# Patient Record
Sex: Male | Born: 1955
Health system: Southern US, Community
[De-identification: ages and names within clinical notes are randomized; demographics above are authoritative.]

## PROBLEM LIST (undated history)

## (undated) DIAGNOSIS — G4730 Sleep apnea, unspecified: Secondary | ICD-10-CM

## (undated) DIAGNOSIS — J449 Chronic obstructive pulmonary disease, unspecified: Secondary | ICD-10-CM

## (undated) DIAGNOSIS — I2510 Atherosclerotic heart disease of native coronary artery without angina pectoris: Secondary | ICD-10-CM

## (undated) DIAGNOSIS — I428 Other cardiomyopathies: Secondary | ICD-10-CM

## (undated) DIAGNOSIS — I509 Heart failure, unspecified: Secondary | ICD-10-CM

## (undated) DIAGNOSIS — F419 Anxiety disorder, unspecified: Secondary | ICD-10-CM

## (undated) DIAGNOSIS — E119 Type 2 diabetes mellitus without complications: Secondary | ICD-10-CM

## (undated) DIAGNOSIS — I5022 Chronic systolic (congestive) heart failure: Secondary | ICD-10-CM

## (undated) DIAGNOSIS — H269 Unspecified cataract: Secondary | ICD-10-CM

## (undated) HISTORY — DX: Unspecified cataract: H26.9

## (undated) HISTORY — DX: Heart failure, unspecified: I50.9

## (undated) HISTORY — DX: Sleep apnea, unspecified: G47.30

## (undated) HISTORY — DX: Other cardiomyopathies: I42.8

## (undated) HISTORY — DX: Anxiety disorder, unspecified: F41.9

## (undated) HISTORY — DX: Chronic obstructive pulmonary disease, unspecified: J44.9

## (undated) HISTORY — DX: Atherosclerotic heart disease of native coronary artery without angina pectoris: I25.10

## (undated) HISTORY — DX: Chronic systolic (congestive) heart failure: I50.22

## (undated) HISTORY — DX: Type 2 diabetes mellitus without complications: E11.9

---

## 2018-02-04 ENCOUNTER — Other Ambulatory Visit: Payer: Self-pay | Admitting: Family Medicine

## 2018-02-04 DIAGNOSIS — J849 Interstitial pulmonary disease, unspecified: Secondary | ICD-10-CM | POA: Diagnosis not present

## 2018-02-04 DIAGNOSIS — R0602 Shortness of breath: Secondary | ICD-10-CM

## 2018-02-06 ENCOUNTER — Ambulatory Visit
Admission: RE | Admit: 2018-02-06 | Discharge: 2018-02-06 | Disposition: A | Discharge status: Home / Self Care | Payer: BLUE CROSS/BLUE SHIELD | Source: Ambulatory Visit | Attending: Family Medicine | Admitting: Family Medicine

## 2018-02-06 ENCOUNTER — Encounter (INDEPENDENT_AMBULATORY_CARE_PROVIDER_SITE_OTHER): Payer: Self-pay

## 2018-02-06 DIAGNOSIS — R918 Other nonspecific abnormal finding of lung field: Secondary | ICD-10-CM | POA: Insufficient documentation

## 2018-02-06 DIAGNOSIS — I2510 Atherosclerotic heart disease of native coronary artery without angina pectoris: Secondary | ICD-10-CM | POA: Insufficient documentation

## 2018-02-06 DIAGNOSIS — I700 Atherosclerosis of aorta: Secondary | ICD-10-CM | POA: Insufficient documentation

## 2018-02-06 DIAGNOSIS — R0602 Shortness of breath: Secondary | ICD-10-CM

## 2018-03-02 DIAGNOSIS — J181 Lobar pneumonia, unspecified organism: Secondary | ICD-10-CM | POA: Diagnosis not present

## 2018-03-02 DIAGNOSIS — R5383 Other fatigue: Secondary | ICD-10-CM | POA: Diagnosis not present

## 2018-03-12 ENCOUNTER — Ambulatory Visit: Payer: BLUE CROSS/BLUE SHIELD | Admitting: Internal Medicine

## 2018-03-12 ENCOUNTER — Encounter: Payer: Self-pay | Admitting: Internal Medicine

## 2018-03-12 VITALS — BP 148/92 | HR 107 | Resp 16 | Ht 67.0 in | Wt 213.0 lb

## 2018-03-12 DIAGNOSIS — J929 Pleural plaque without asbestos: Secondary | ICD-10-CM

## 2018-03-12 DIAGNOSIS — R918 Other nonspecific abnormal finding of lung field: Secondary | ICD-10-CM | POA: Insufficient documentation

## 2018-03-12 DIAGNOSIS — R0602 Shortness of breath: Secondary | ICD-10-CM | POA: Insufficient documentation

## 2018-03-12 DIAGNOSIS — J90 Pleural effusion, not elsewhere classified: Secondary | ICD-10-CM | POA: Diagnosis not present

## 2018-03-12 DIAGNOSIS — J869 Pyothorax without fistula: Secondary | ICD-10-CM | POA: Insufficient documentation

## 2018-03-12 NOTE — Patient Instructions (Signed)
Pleural Effusion °A pleural effusion is an abnormal buildup of fluid in the layers of tissue between your lungs and the inside of your chest (pleural space). These two layers of tissue that line both your lungs and the inside of your chest are called pleura. Usually, there is no air in the space between the pleura, only a thin layer of fluid. If left untreated, a large amount of fluid can build up and cause the lung to collapse. A pleural effusion is usually caused by another disease that requires treatment. °The two main types of pleural effusion are: °· Transudative pleural effusion. This happens when fluid leaks into the pleural space because of a low protein count in your blood or high blood pressure in your vessels. Heart failure often causes this. °· Exudative infusion. This occurs when fluid collects in the pleural space from blocked blood vessels or lymph vessels. Some lung diseases, injuries, and cancers can cause this type of effusion. ° °What are the causes? °Pleural effusion can be caused by: °· Heart failure. °· A blood clot in the lung (pulmonary embolism). °· Pneumonia. °· Cancer. °· Liver failure (cirrhosis). °· Kidney disease. °· Complications from surgery, such as from open heart surgery. ° °What are the signs or symptoms? °In some cases, pleural effusion may cause no symptoms. Symptoms can include: °· Shortness of breath, especially when lying down. °· Chest pain, often worse when taking a deep breath. °· Fever. °· Dry cough that is lasting (chronic). °· Hiccups. °· Rapid breathing. ° °An underlying condition that is causing the pleural effusion (such as heart failure, pneumonia, blood clots, tuberculosis, or cancer) may also cause additional symptoms. °How is this diagnosed? °Your health care provider may suspect pleural effusion based on your symptoms and medical history. Your health care provider will also do a physical exam and a chest X-ray. If the X-ray shows there is fluid in your chest,  you may need to have this fluid removed using a needle (thoracentesis) so it can be tested. °You may also have: °· Imaging studies of the chest, such as: °? Ultrasound. °? CT scan. °· Blood tests for kidney and liver function. ° °How is this treated? °Treatment depends on the cause of the pleural effusion. Treatment may include: °· Taking antibiotic medicines to clear up an infection that is causing the pleural effusion. °· Placing a tube in the chest to drain the effusion (tube thoracostomy). This procedure is often used when there is an infection in the fluid. °· Surgery to remove the fibrous outer layer of tissue from the pleural space (decortication). °· Thoracentesis, which can improve cough and shortness of breath. °· A procedure to put medicine into the chest cavity to seal the pleural space to prevent fluid buildup (pleurodesis). °· Chemotherapy and radiation therapy. These may be required in the case of cancerous (malignant) pleural effusion. ° °Follow these instructions at home: °· Take medicines only as directed by your health care provider. °· Keep track of how long you can gently exercise before you get short of breath. Try simply walking at first. °· Do not use any tobacco products, including cigarettes, chewing tobacco, or electronic cigarettes. If you need help quitting, ask your health care provider. °· Keep all follow-up visits as directed by your health care provider. This is important. °Contact a health care provider if: °· The amount of time that you are able to exercise decreases or does not improve with time. °· You have pain or signs of infection   at the puncture site if you had thoracentesis. Watch for: °? Drainage. °? Redness. °? Swelling. °· You have a fever. °Get help right away if: °· You are short of breath. °· You develop chest pain. °· You develop a new cough. °This information is not intended to replace advice given to you by your health care provider. Make sure you discuss any  questions you have with your health care provider. °Document Released: 11/06/2005 Document Revised: 04/10/2016 Document Reviewed: 04/01/2014 °Elsevier Interactive Patient Education © 2018 [employer] ° °

## 2018-03-12 NOTE — Progress Notes (Signed)
[facility] [address] [place], [place] [address]  Pulmonary Sleep Medicine   Office Visit Note  Patient Name: Joe Berry DOB: [birth year] MRN [id number]  Date of Service: 03/12/2018  Complaints/HPI:  Patient is referred for evaluation of shortness of breath and abnormal CT.  Patient had been having some cough and shortness of breath and congestion he was seen by urgent care had a chest x-ray done which was abnormal.  Was referred for CT scan of the chest which I have available.  The CT scan shows fluid which appears to be loculated concerning for an empyema.  Patient states that he has received several rounds of antibiotics he now feels improved but not quite back to baseline.  In addition incidental finding of pleural plaque was noted with calcifications in the pleural area.  He states he has never had any exposure to asbestos.  I asked him if his father worked with asbestos any stated that that was never the case.  He himself worked in dusty environments but not really have any direct exposure to asbestos.  Of note he has been a pack and a half smoker every day and just recently has quit smoking very currently denies having any chest pain no palpitations 6 shortness of breath he says has improved somewhat.  ROS  General: (-) fever, (-) chills, (-) night sweats, (-) weakness Skin: (-) rashes, (-) itching,. Eyes: (-) visual changes, (-) redness, (-) itching. Nose and Sinuses: (-) nasal stuffiness or itchiness, (-) postnasal drip, (-) nosebleeds, (-) sinus trouble. Mouth and Throat: (-) sore throat, (-) hoarseness. Neck: (-) swollen glands, (-) enlarged thyroid, (-) neck pain. Respiratory: - cough, (-) bloody sputum, - shortness of breath, - wheezing. Cardiovascular: - ankle swelling, (-) chest pain. +gallop Lymphatic: (-) lymph node enlargement. Neurologic: (-) numbness, (-) tingling. Psychiatric: (-) anxiety, (-) depression   Current Medication: Outpatient Encounter  Medications as of 03/12/2018  Medication Sig  . [place] (FIBER CHOICE PO) Take by mouth.   No facility-administered encounter medications on file as of 03/12/2018.     Surgical History:   Medical History: Past Medical History:  Diagnosis Date  . Anxiety     Family History: Family History  Problem Relation Age of Onset  . Heart disease Mother   . Lung cancer Father   . Heart disease Maternal Grandmother     Social History: Social History   Socioeconomic History  . Marital status: Single    Spouse name: Not on file  . Number of children: Not on file  . Years of education: Not on file  . Highest education level: Not on file  Occupational History  . Not on file  Social Needs  . Financial resource strain: Not on file  . Food insecurity:    Worry: Not on file    Inability: Not on file  . Transportation needs:    Medical: Not on file    Non-medical: Not on file  Tobacco Use  . Smoking status: Former Smoker    Types: Cigarettes  . Smokeless tobacco: Never Used  . Tobacco comment: quit 2008  Substance and Sexual Activity  . Alcohol use: Never    Frequency: Never  . Drug use: Never  . Sexual activity: Not on file  Lifestyle  . Physical activity:    Days per week: Not on file    Minutes per session: Not on file  . Stress: Not on file  Relationships  . Social connections:    Talks  on phone: Not on file    Gets together: Not on file    Attends religious service: Not on file    Active member of club or organization: Not on file    Attends meetings of clubs or organizations: Not on file    Relationship status: Not on file  . Intimate partner violence:    Fear of current or ex partner: Not on file    Emotionally abused: Not on file    Physically abused: Not on file    Forced sexual activity: Not on file  Other Topics Concern  . Not on file  Social History Narrative  . Not on file    Vital Signs: Blood pressure (!) 148/92, pulse (!) 107, resp. rate 16, height  5\' 7"  (1.702 m), weight 213 lb (96.6 kg), SpO2 95 %.  Examination: General Appearance: The patient is well-developed, well-nourished, and in no distress. Skin: Gross inspection of skin unremarkable. Head: normocephalic, no gross deformities. Eyes: no gross deformities noted. ENT: ears appear grossly normal no exudates. Neck: Supple. No thyromegaly. No LAD. Respiratory:  Distant breath sounds scattered rhonchi. Cardiovascular: Normal S1 and S2 without murmur or rub. Extremities: No cyanosis. pulses are equal. Neurologic: Alert and oriented. No involuntary movements.  LABS: No results found for this or any previous visit (from the past 2160 hour(s)).  Radiology: Ct Chest Wo Contrast  Result Date: 02/06/2018 CLINICAL DATA:  Coughing congestion for 2 weeks, shortness of breath, abnormal outside chest x-ray, not currently available EXAM: CT CHEST WITHOUT CONTRAST TECHNIQUE: Multidetector CT imaging of the chest was performed following the standard protocol without IV contrast. COMPARISON:  None. FINDINGS: Cardiovascular: Moderate thoracic aortic atherosclerosis is present. Diffuse coronary artery calcifications are noted involving left anterior descending, left main, circumflex, and right coronary arteries. Cardiomegaly is noted with only a tiny amount of pericardial fluid noted. Mediastinum/Nodes: No mediastinal or hilar adenopathy is seen. All mediastinal lymph nodes are within normal limits in size. The thyroid gland is unremarkable. No abnormality of the esophagus is seen. Lungs/Pleura: On the right there is some pleural thickening throughout the right hemithorax. There is a loculated collection of fluid posteriorly within the major and minor fissures. This collection has attenuation of 8 Hounsfield units. An empyema cannot be excluded. There is triangular opacity within the posteromedial right lower lobe with air bronchograms most consistent with a focus of pneumonia versus atelectasis. There are  several scattered pleural calcifications on the right and there is considerable calcification of the right hemidiaphragm. These findings most likely are asbestos related. However, no suspicious mass is seen. The central airway is patent. Upper Abdomen: Within the upper abdomen, the left lobe of the liver is somewhat prominent. The pancreas is unremarkable. Musculoskeletal: There are degenerative changes within the mid to lower thoracic spine. IMPRESSION: 1. Loculated fluid posteriorly on the right at the junction of the major and minor fissures. Empyema is difficult to exclude. 2. Triangular opacity in the posteromedial right lower lobe with air bronchograms most consistent with a focus of pneumonia. 3. Pleural calcifications and calcifications of the right hemidiaphragm with somewhat thickened pleura throughout the right hemithorax, findings most likely asbestos related. 4. Moderate thoracic aortic atherosclerosis and diffuse coronary artery calcifications. Electronically Signed   By: Dwyane Dee M.D.   On: 02/06/2018 14:29    No results found.  No results found.    Assessment and Plan: Patient Active Problem List   Diagnosis Date Noted  . Empyema lung (HCC) 03/12/2018  . Pleural  effusion 03/12/2018  . Pleural plaque without asbestos 03/12/2018  . SOB (shortness of breath) 03/12/2018  . Lung mass 03/12/2018    1. SOB  Somewhat improving will get a follow-up CT scan to see if there has been resolution of the fluid collection.  If not patient will need either a CT guided biopsy or drainage of the collection of fluid for further analysis.  My concern in this situation obviously is for underlying malignancy in heavy smoker 2. Empyema? Possibility of empyema also exists would recommend doing the CT scan to do a follow-up and then suggest doing either an ultrasound-guided thoracentesis for CT scan-guided aspiration 3. Pleural Plaques without a direct history of exposure to asbestos unclear etiology  we will do followup CT as already mentioned above 4. ?lung Mass as noted above we will drain the fluid if necessary 2 look for underlying mass or tumor in a heavy smoker 5. Abnormal Heart sounds echocardiogram was ordered Will get that scheduled for abnormal heart sounds and a gallop on  Evaluation along with a history of shortness of breath  General Counseling: I have discussed the findings of the evaluation and examination with Harvie Heck.  I have also discussed any further diagnostic evaluation thatmay be needed or ordered today. Keisean verbalizes understanding of the findings of todays visit. We also reviewed his medications today and discussed drug interactions and side effects including but not limited excessive drowsiness and altered mental states. We also discussed that there is always a risk not just to him but also people around him. he has been encouraged to call the office with any questions or concerns that should arise related to todays visit.    Time spent:  I have personally obtained a history, examined the patient, evaluated laboratory and imaging results, formulated the assessment and plan and placed orders.    Yevonne Pax, MD [facility] Sleep medicine

## 2018-03-13 ENCOUNTER — Telehealth: Payer: Self-pay

## 2018-03-13 NOTE — Telephone Encounter (Signed)
Patient advised that he is scheduled for CT Chest on 03/19/18 @ 9:00 mebane/tat

## 2018-03-14 NOTE — Addendum Note (Signed)
Addended by: Lyndon Code on: 03/14/2018 11:34 AM   Modules accepted: Orders

## 2018-03-18 ENCOUNTER — Telehealth: Payer: Self-pay | Admitting: Internal Medicine

## 2018-03-18 NOTE — Telephone Encounter (Signed)
PATIENT WAS INFORMED OF APPT. AT 11:00 O 03/19/18 AT [email or web address]

## 2018-03-19 ENCOUNTER — Ambulatory Visit
Admission: RE | Admit: 2018-03-19 | Discharge: 2018-03-19 | Disposition: A | Discharge status: Home / Self Care | Payer: BLUE CROSS/BLUE SHIELD | Source: Ambulatory Visit | Attending: Internal Medicine | Admitting: Internal Medicine

## 2018-03-19 ENCOUNTER — Other Ambulatory Visit
Admission: RE | Admit: 2018-03-19 | Discharge: 2018-03-19 | Disposition: A | Discharge status: Home / Self Care | Payer: BLUE CROSS/BLUE SHIELD | Source: Ambulatory Visit | Attending: Internal Medicine | Admitting: Internal Medicine

## 2018-03-19 DIAGNOSIS — R911 Solitary pulmonary nodule: Secondary | ICD-10-CM | POA: Insufficient documentation

## 2018-03-19 DIAGNOSIS — J869 Pyothorax without fistula: Secondary | ICD-10-CM | POA: Diagnosis not present

## 2018-03-19 DIAGNOSIS — R918 Other nonspecific abnormal finding of lung field: Secondary | ICD-10-CM | POA: Insufficient documentation

## 2018-03-19 DIAGNOSIS — I2510 Atherosclerotic heart disease of native coronary artery without angina pectoris: Secondary | ICD-10-CM | POA: Insufficient documentation

## 2018-03-19 DIAGNOSIS — Z87891 Personal history of nicotine dependence: Secondary | ICD-10-CM | POA: Diagnosis not present

## 2018-03-19 DIAGNOSIS — J439 Emphysema, unspecified: Secondary | ICD-10-CM | POA: Insufficient documentation

## 2018-03-19 DIAGNOSIS — R0602 Shortness of breath: Secondary | ICD-10-CM | POA: Diagnosis not present

## 2018-03-19 LAB — CREATININE, SERUM
Creatinine, Ser: 0.76 mg/dL (ref 0.61–1.24)
GFR calc Af Amer: >60 mL/min (ref >60)
GFR calc non Af Amer: >60 mL/min (ref >60)

## 2018-03-19 MED ORDER — IOPAMIDOL (ISOVUE-300) INJECTION 61%
75.0000 mL | Freq: Once | INTRAVENOUS | Status: AC | PRN
  Administered 2018-03-19: 75 mL via INTRAVENOUS

## 2018-03-19 NOTE — Progress Notes (Signed)
*  PRELIMINARY RESULTS* Echocardiogram 2D Echocardiogram has been performed.  Joe Berry [name] 03/19/2018, 11:41 AM

## 2018-03-20 ENCOUNTER — Ambulatory Visit: Payer: BLUE CROSS/BLUE SHIELD | Admitting: Internal Medicine

## 2018-03-20 DIAGNOSIS — R0602 Shortness of breath: Secondary | ICD-10-CM | POA: Diagnosis not present

## 2018-03-20 LAB — PULMONARY FUNCTION TEST

## 2018-03-26 ENCOUNTER — Ambulatory Visit: Payer: BLUE CROSS/BLUE SHIELD | Admitting: Internal Medicine

## 2018-03-26 ENCOUNTER — Encounter: Payer: Self-pay | Admitting: Internal Medicine

## 2018-03-26 VITALS — BP 144/80 | HR 110 | Resp 16 | Ht 67.0 in | Wt 214.8 lb

## 2018-03-26 DIAGNOSIS — I5021 Acute systolic (congestive) heart failure: Secondary | ICD-10-CM

## 2018-03-26 DIAGNOSIS — J90 Pleural effusion, not elsewhere classified: Secondary | ICD-10-CM | POA: Diagnosis not present

## 2018-03-26 DIAGNOSIS — R0602 Shortness of breath: Secondary | ICD-10-CM

## 2018-03-26 DIAGNOSIS — J869 Pyothorax without fistula: Secondary | ICD-10-CM

## 2018-03-26 NOTE — Progress Notes (Signed)
[facility] [place], [place] [address]  Pulmonary Sleep Medicine   Office Visit Note  Patient Name: Joe Berry DOB: 07-30-1956 MRN [id number]  Date of Service: 03/26/2018  Complaints/HPI:  Patient is here for follow-up after echocardiogram.  The echocardiogram showed ejection fraction of approximately 30%.  He has no chest pain no previous history of heart failure no coronary artery disease by history patient has had no swelling of his legs the only symptoms have been shortness of breath.  He does have a history of sleep apnea and he is non compliant with CPAP therapy.  We had a very lengthy conversation regarding the options at this point and we will be referring him to a cardiologist  ROS  General: (-) fever, (-) chills, (-) night sweats, (-) weakness Skin: (-) rashes, (-) itching,. Eyes: (-) visual changes, (-) redness, (-) itching. Nose and Sinuses: (-) nasal stuffiness or itchiness, (-) postnasal drip, (-) nosebleeds, (-) sinus trouble. Mouth and Throat: (-) sore throat, (-) hoarseness. Neck: (-) swollen glands, (-) enlarged thyroid, (-) neck pain. Respiratory: - cough, (-) bloody sputum, + shortness of breath, - wheezing. Cardiovascular: - ankle swelling, (-) chest pain. Lymphatic: (-) lymph node enlargement. Neurologic: (-) numbness, (-) tingling. Psychiatric: (-) anxiety, (-) depression   Current Medication: Outpatient Encounter Medications as of 03/26/2018  Medication Sig  . Inulin (FIBER CHOICE PO) Take by mouth.   No facility-administered encounter medications on file as of 03/26/2018.     Surgical History: History reviewed. No pertinent surgical history.  Medical History: Past Medical History:  Diagnosis Date  . Anxiety     Family History: Family History  Problem Relation Age of Onset  . Heart disease Mother   . Lung cancer Father   . Heart disease Maternal Grandmother     Social History: Social History   Socioeconomic  History  . Marital status: Single    Spouse name: Not on file  . Number of children: Not on file  . Years of education: Not on file  . Highest education level: Not on file  Occupational History  . Not on file  Social Needs  . Financial resource strain: Not on file  . Food insecurity:    Worry: Not on file    Inability: Not on file  . Transportation needs:    Medical: Not on file    Non-medical: Not on file  Tobacco Use  . Smoking status: Former Smoker    Types: Cigarettes  . Smokeless tobacco: Never Used  . Tobacco comment: quit 2008  Substance and Sexual Activity  . Alcohol use: Never    Frequency: Never  . Drug use: Never  . Sexual activity: Not on file  Lifestyle  . Physical activity:    Days per week: Not on file    Minutes per session: Not on file  . Stress: Not on file  Relationships  . Social connections:    Talks on phone: Not on file    Gets together: Not on file    Attends religious service: Not on file    Active member of club or organization: Not on file    Attends meetings of clubs or organizations: Not on file    Relationship status: Not on file  . Intimate partner violence:    Fear of current or ex partner: Not on file    Emotionally abused: Not on file    Physically abused: Not on file    Forced sexual activity: Not  on file  Other Topics Concern  . Not on file  Social History Narrative  . Not on file    Vital Signs: Blood pressure (!) 144/80, pulse (!) 110, resp. rate 16, height '5\' 7"'  (1.702 m), weight 214 lb 12.8 oz (97.4 kg), SpO2 95 %.  Examination: General Appearance: The patient is well-developed, well-nourished, and in no distress. Skin: Gross inspection of skin unremarkable. Head: normocephalic, no gross deformities. Eyes: no gross deformities noted. ENT: ears appear grossly normal no exudates. Neck: Supple. No thyromegaly. No LAD. Respiratory: no rhonchi. Cardiovascular: Normal S1 and S2 without murmur or rub. Extremities: No  cyanosis. pulses are equal. Neurologic: Alert and oriented. No involuntary movements.  LABS: Recent Results (from the past 2160 hour(s))  Creatinine, serum     Status: None   Collection Time: 03/19/18  9:25 AM  Result Value Ref Range   Creatinine, Ser 0.76 0.61 - 1.24 mg/dL   GFR calc non Af Amer >60 >60 mL/min   GFR calc Af Amer >60 >60 mL/min    Comment: (NOTE) The eGFR has been calculated using the CKD EPI equation. This calculation has not been validated in all clinical situations. eGFR's persistently <60 mL/min signify possible Chronic Kidney Disease. Performed at [facility], [address], [place], [place] [address]     Radiology: Ct Chest W Contrast  Result Date: 03/19/2018 CLINICAL DATA:  62 year old male status post chest CT in March for cough congestion and shortness of breath. Former smoker. Subsequent encounter. EXAM: CT CHEST WITH CONTRAST TECHNIQUE: Multidetector CT imaging of the chest was performed during intravenous contrast administration. CONTRAST:  58m ISOVUE-300 IOPAMIDOL (ISOVUE-300) INJECTION 61% COMPARISON:  Chest CT without contrast 02/06/2018. FINDINGS: Cardiovascular: Calcified coronary artery atherosclerosis. Lesser Calcified aortic atherosclerosis. Stable mild cardiomegaly. No pericardial effusion. Mediastinum/Nodes: Stable with no mediastinal or hilar lymphadenopathy identified. Lungs/Pleura: The major airways remain patent. Upper lobe emphysema, more pronounced on the right with both centrilobular and paraseptal features. Bilateral perihilar bronchiectasis. Minor left apical and left lung base scarring is stable with no other confluent left lung opacity. Unchanged appearance of partially calcified pleural thickening throughout much of the right hemithorax and especially along the posterior and diaphragmatic surfaces of the pleura (series 2, image 125). There is associated enhancing atelectasis in the posterior basal segment of the right lower  lobe which is unchanged since March and again demonstrates some air bronchograms. A superimposed small volume of what appears to be trapped pleural fluid in the major fissure (series 2, image 92) has mildly decreased but not resolved. No pleural enhancement is associated. No new areas of confluent opacity or consolidation in the right lung. A 4 mm upper lobe lung nodule is stable on series 3, image 63. Upper Abdomen: Negative visible liver, spleen, pancreas, adrenal glands, kidneys, and bowel in the upper abdomen. Musculoskeletal: No acute osseous abnormality identified. IMPRESSION: 1. Unchanged CT appearance of the chest since March. The constellation is most compatible with chronic lung disease consisting of combined: Emphysema (ICD10-J43.9), mild bronchiectasis, and right side asbestos related fibrothorax. Associated chronic posterior basal segment right lower lobe atelectasis plus a small volume of trapped pleural fluid along the right major fissure. No empyema or acute pneumonia suspected. 2. Superimposed short interval stability of a 4 mm right upper lobe lung nodule. In this clinical setting re-evaluation with a follow-up chest CT would be recommended in March 2020. However, in light of the above and the history of smoking this patient might be appropriate for and benefit  most from annual Screening with Low Dose Computed Tomography (LDCT). See reference: SafeInstruments.co.uk.aspx?NCAId=[id number] 3. Calcified coronary artery atherosclerosis. Electronically Signed   By: Genevie Ann M.D.   On: 03/19/2018 14:56    No results found.  Ct Chest W Contrast  Result Date: 03/19/2018 CLINICAL DATA:  62 year old male status post chest CT in March for cough congestion and shortness of breath. Former smoker. Subsequent encounter. EXAM: CT CHEST WITH CONTRAST TECHNIQUE: Multidetector CT imaging of the chest was performed during intravenous contrast administration. CONTRAST:   49m ISOVUE-300 IOPAMIDOL (ISOVUE-300) INJECTION 61% COMPARISON:  Chest CT without contrast 02/06/2018. FINDINGS: Cardiovascular: Calcified coronary artery atherosclerosis. Lesser Calcified aortic atherosclerosis. Stable mild cardiomegaly. No pericardial effusion. Mediastinum/Nodes: Stable with no mediastinal or hilar lymphadenopathy identified. Lungs/Pleura: The major airways remain patent. Upper lobe emphysema, more pronounced on the right with both centrilobular and paraseptal features. Bilateral perihilar bronchiectasis. Minor left apical and left lung base scarring is stable with no other confluent left lung opacity. Unchanged appearance of partially calcified pleural thickening throughout much of the right hemithorax and especially along the posterior and diaphragmatic surfaces of the pleura (series 2, image 125). There is associated enhancing atelectasis in the posterior basal segment of the right lower lobe which is unchanged since March and again demonstrates some air bronchograms. A superimposed small volume of what appears to be trapped pleural fluid in the major fissure (series 2, image 92) has mildly decreased but not resolved. No pleural enhancement is associated. No new areas of confluent opacity or consolidation in the right lung. A 4 mm upper lobe lung nodule is stable on series 3, image 63. Upper Abdomen: Negative visible liver, spleen, pancreas, adrenal glands, kidneys, and bowel in the upper abdomen. Musculoskeletal: No acute osseous abnormality identified. IMPRESSION: 1. Unchanged CT appearance of the chest since March. The constellation is most compatible with chronic lung disease consisting of combined: Emphysema (ICD10-J43.9), mild bronchiectasis, and right side asbestos related fibrothorax. Associated chronic posterior basal segment right lower lobe atelectasis plus a small volume of trapped pleural fluid along the right major fissure. No empyema or acute pneumonia suspected. 2. Superimposed  short interval stability of a 4 mm right upper lobe lung nodule. In this clinical setting re-evaluation with a follow-up chest CT would be recommended in March 2020. However, in light of the above and the history of smoking this patient might be appropriate for and benefit most from annual Screening with Low Dose Computed Tomography (LDCT). See reference: hSafeInstruments.co.ukaspx?NCAId=[id number] 3. Calcified coronary artery atherosclerosis. Electronically Signed   By: HGenevie AnnM.D.   On: 03/19/2018 14:56      Assessment and Plan: Patient Active Problem List   Diagnosis Date Noted  . Empyema lung ([place] 03/12/2018  . Pleural effusion 03/12/2018  . Pleural plaque without asbestos 03/12/2018  . SOB (shortness of breath) 03/12/2018  . Lung mass 03/12/2018    1. Systolic CHF we will get him referred to cardiology he prefers to go see [place] at this time. 2. COPD  I have suggested that we get him on inhalers and gave him a sample of Symbicort to use twice a day 3. Empyema follow-up CT results were noted it is not consistent with empyema the volume of the fluid is very low and not really able to be accessed at this time 4. Pleural effusion not enough fluid to be accessed at this time. 5. Pulmonary nodule  Stable will have a follow-up done in 6 months to a year 6. Obstructive sleep apnea strongly  urged him to have a sleep study done and get on therapy in the setting of his congestive heart failure.  He states right now he wants to think it over  General Counseling: I have discussed the findings of the evaluation and examination with Louie Casa.  I have also discussed any further diagnostic evaluation thatmay be needed or ordered today. Jabree verbalizes understanding of the findings of todays visit. We also reviewed his medications today and discussed drug interactions and side effects including but not limited excessive drowsiness and altered mental states. We  also discussed that there is always a risk not just to him but also people around him. he has been encouraged to call the office with any questions or concerns that should arise related to todays visit.    Time spent: 63mn  I have personally obtained a history, examined the patient, evaluated laboratory and imaging results, formulated the assessment and plan and placed orders.    SAllyne Gee MD [facility]

## 2018-03-26 NOTE — Patient Instructions (Signed)
Heart Failure and Exercise °Heart failure is a condition in which the heart does not fill or pump enough blood and oxygen to support your body and its functions. Heart failure is a long-term (chronic) condition. Living with heart failure can be challenging. However, following your health care provider's instructions about a healthy lifestyle may help improve your symptoms. This includes choosing the right exercise plan. °Doing daily physical activity is important after a diagnosis of heart failure. You may have some activity restrictions, so talk to your health care provider before doing any exercises. °What are the benefits of exercise? °Exercise may: °· Make your heart muscles stronger. °· Lower your blood pressure. °· Lower your cholesterol. °· Help you lose weight. °· Help your bones stay strong. °· Improve your blood circulation. °· Help your body use oxygen better. This relieves symptoms such as fatigue and shortness of breath. °· Help your mental health by lowering the risk of depression and other problems. °· Improve your quality of life. °· Decrease your chance of hospital admission for heart failure. ° °What is an exercise plan? °An exercise plan is a set of specific exercises and training activities. You will work with your health care provider to create the exercise plan that works for you. The plan may include: °· Different types of exercises and how to do them. °· Cardiac rehabilitation exercises. These are supervised programs that are designed to strengthen your heart. ° °What are strengthening exercises? °Strengthening exercises are a type of physical activity that involves using resistance to improve your muscle strength. Strengthening exercises usually have repetitive motions. These types of exercises can include: °· Lifting weights. °· Using weight machines. °· Using resistance tubes and bands. °· Using kettlebells. °· Using your body weight, such as doing push-ups or squats. ° °What are balance  exercises? °Balance exercises are another type of physical activity. They strengthen the muscles of the back, stomach, and pelvis (core muscles) and improve your balance. They can also lower your risk of falling. These types of exercises can include: °· Standing on one leg. °· Walking backward, sideways, and in a straight line. °· Standing up after sitting, without using your hands. °· Shifting your weight from one leg to the other. °· Lifting one leg in front of you. °· Doing tai chi. This is a type of exercise that uses slow movements and deep breathing. ° °How can I increase my flexibility? °Having better flexibility can keep you from falling. It can also lengthen your muscles, improve your range of motion, and help your joints. You can increase your flexibility by: °· Doing tai chi. °· Doing yoga. °· Stretching. ° °How much aerobic exercise should I get? °Aerobic exercises strengthen your breathing and circulation system and increase your body's use of oxygen. Examples of aerobic exercise include biking, walking, running, and swimming. Talk to your health care provider to find out how much aerobic exercise is safe for you.  °To do these exercises: °· Start exercising slowly, limiting the amount of time at first. You may need to start with 5 minutes of aerobic exercise every day. °· Slowly add more minutes until you can safely do at least 30 minutes of exercise at least 4 days a week. ° °Summary °· Daily physical activity is important after a diagnosis of heart failure. °· Exercise can make your heart muscles stronger. It also offers other benefits that will improve your health. °· Talk to your health care provider before doing any exercises. °This information is   not intended to replace advice given to you by your health care provider. Make sure you discuss any questions you have with your health care provider. Document Released: 03/20/2017 Document Revised: 03/20/2017 Document Reviewed: 03/20/2017 Elsevier  Interactive Patient Education  2018 [employer].

## 2018-04-03 DIAGNOSIS — I420 Dilated cardiomyopathy: Secondary | ICD-10-CM | POA: Insufficient documentation

## 2018-04-03 DIAGNOSIS — I2510 Atherosclerotic heart disease of native coronary artery without angina pectoris: Secondary | ICD-10-CM | POA: Insufficient documentation

## 2018-04-03 DIAGNOSIS — I428 Other cardiomyopathies: Secondary | ICD-10-CM | POA: Insufficient documentation

## 2018-04-03 NOTE — Progress Notes (Signed)
Cardiology Office Note  Date:  04/04/2018   ID:  Joe Berry, DOB 1956/06/10, MRN [id number]  PCP:  Yevonne Pax, MD   Chief Complaint  Patient presents with  . other    CHF c/o sob with upper chest tightness. Meds reviewed verbally with pt.    HPI:   Joe Berry is a 62 year old gentleman with past medical history of smoking Anxiety Coronary calcifications on CT scan Emphysema/COPD Empyema Referred by Freda Munro for consultation of his abnormal echocardiogram, cardiomyopathy and shortness of breath symptoms  Reports having shortness of breath for the past several months Also significant fatigue Initially presented to the care urgent care facility Chest x-ray the concerning for pneumonia Refer to pulmonary CT scan pulmonary function test echocardiogram performed Concern for empyema resolving infection pneumonia  CT scan images pulled up with him in the office today showing significant coronary calcification all 3 vessels  Echocardiogram edges pulled up with him in the office showing severely depressed LV function moderately dilated LV ejection fraction 25-30% dated 03/19/2018  Reports having some chest tightness on heavy exertion onassociated with his breathing/shortness of breath   history of sleep apnea and he is non compliant with CPAP therapy.    EKG personally reviewed by myself on todays visit Shows sinus tachycardia rate 106 bpm poor R-wave progression through the anterior precordial leads T-wave abnormality in 1 and aVL  PMH:   has a past medical history of Anxiety and COPD (chronic obstructive pulmonary disease) (HCC).  PSH:   History reviewed. No pertinent surgical history.  Current Outpatient Medications  Medication Sig Dispense Refill  . budesonide-formoterol (SYMBICORT) 80-4.5 MCG/ACT inhaler Inhale 2 puffs into the lungs 2 (two) times daily.    . Inulin (FIBER CHOICE PO) Take by mouth.    Marland Kitchen aspirin EC 81 MG tablet Take 1 tablet (81 mg total) by  mouth daily. 90 tablet 3  . metoprolol succinate (TOPROL-XL) 50 MG 24 hr tablet Take 1 tablet (50 mg total) by mouth daily. Take with or immediately following a meal. 90 tablet 3   No current facility-administered medications for this visit.      Allergies:   Patient has no known allergies.   Social History:  The patient  reports that he has quit smoking. His smoking use included cigarettes. He has a 60.00 pack-year smoking history. He has never used smokeless tobacco. He reports that he does not drink alcohol or use drugs.   Family History:   family history includes Heart Problems in his brother, maternal grandfather, and maternal grandmother; Heart disease in his maternal grandmother and mother; Lung cancer in his father.    Review of Systems: Review of Systems  Constitutional: Positive for malaise/fatigue.  Respiratory: Positive for shortness of breath.   Cardiovascular: Negative.        Chest tightness  Gastrointestinal: Negative.   Musculoskeletal: Negative.   Neurological: Negative.   Psychiatric/Behavioral: Negative.   All other systems reviewed and are negative.    PHYSICAL EXAM: VS:  BP (!) 137/93 (BP Location: Right Arm, Patient Position: Sitting, Cuff Size: Normal)   Pulse (!) 106   Ht 5\' 7"  (1.702 m)   Wt 212 lb 12 oz (96.5 kg)   BMI 33.32 kg/m  , BMI Body mass index is 33.32 kg/m. GEN: Well nourished, well developed, in no acute distress  HEENT: normal  Neck: no JVD, carotid bruits, or masses Cardiac: RRR; no murmurs, rubs, or gallops,no edema  Respiratory:  clear to auscultation bilaterally,  normal work of breathing GI: soft, nontender, nondistended, + BS MS: no deformity or atrophy  Skin: warm and dry, no rash Neuro:  Strength and sensation are intact Psych: euthymic mood, full affect    Recent Labs: 03/19/2018: Creatinine, Ser 0.76    Lipid Panel No results found for: CHOL, HDL, LDLCALC, TRIG    Wt Readings from Last 3 Encounters:  04/04/18 212  lb 12 oz (96.5 kg)  03/26/18 214 lb 12.8 oz (97.4 kg)  03/12/18 213 lb (96.6 kg)       ASSESSMENT AND PLAN:  Dilated cardiomyopathy (HCC) -  Etiology of his cardiomyopathy is concerning for ischemia, given the heavy three-vessel calcification seen on CT scan chest Echocardiogram reviewed with him with severely depressed ejection fraction again concerning for ischemic cardiomyopathy First treatment options discussed with him including medical management, stress testing or cardiac catheterization given concern for unstable angina symptoms After long discussion he prefers cardiac catheterization I have reviewed the risks, indications, and alternatives to cardiac catheterization, possible angioplasty, and stenting with the patient. Risks include but are not limited to bleeding, infection, vascular injury, stroke, myocardial infection, arrhythmia, kidney injury, radiation-related injury in the case of prolonged fluoroscopy use, emergency cardiac surgery, and death. The patient understands the risks of serious complication is 1-2 in 1000 with diagnostic cardiac cath and 1-2% or less with angioplasty/stenting.  We will start him on aspirin, metoprolol succinate 50 mg daily Scheduled for next week on May 24  Empyema lung (HCC) Stopped smoking many years ago CT scan with COPD, possible empyema Will defer management to pulmonary Reports is completed antibiotics  SOB (shortness of breath) Worsening shortness of breath past several months Wife feels he is frail, fragile, major change in his exertional capacity Cardiomyopathy above with significant three-vessel coronary calcifications Cardiac catheterization planned as above  Coronary artery calcification seen on CAT scan - Details shown to him diffuse heavy calcification LAD, circumflex and RCA  Unstable angina Shortness of breath, chest tightness in the setting of cardiomyopathy and severe heavy three-vessel coronary calcification on CT  scan Cardiac catheterization planned as detailed above   Total encounter time more than 80 minutes  Greater than 50% was spent in counseling and coordination of care with the patient  Disposition:   F/U one month  Patient seen in consultation or Dr. Welton Flakes and will be referred back to his office for ongoing care of the issues detailed above   Orders Placed This Encounter  Procedures  . CBC with Differential/Platelet  . Basic metabolic panel  . INR/PT  . EKG 12-Lead     Signed, Dossie Arbour, M.D., Ph.D. 04/04/2018  [facility] [place], [place] [phone]

## 2018-04-04 ENCOUNTER — Encounter: Payer: Self-pay | Admitting: Cardiovascular Disease

## 2018-04-04 ENCOUNTER — Ambulatory Visit (INDEPENDENT_AMBULATORY_CARE_PROVIDER_SITE_OTHER): Payer: BLUE CROSS/BLUE SHIELD | Admitting: Cardiovascular Disease

## 2018-04-04 VITALS — BP 137/93 | HR 106 | Ht 67.0 in | Wt 212.8 lb

## 2018-04-04 DIAGNOSIS — R0602 Shortness of breath: Secondary | ICD-10-CM

## 2018-04-04 DIAGNOSIS — I420 Dilated cardiomyopathy: Secondary | ICD-10-CM | POA: Diagnosis not present

## 2018-04-04 DIAGNOSIS — I200 Unstable angina: Secondary | ICD-10-CM

## 2018-04-04 DIAGNOSIS — J90 Pleural effusion, not elsewhere classified: Secondary | ICD-10-CM | POA: Diagnosis not present

## 2018-04-04 DIAGNOSIS — I2510 Atherosclerotic heart disease of native coronary artery without angina pectoris: Secondary | ICD-10-CM | POA: Diagnosis not present

## 2018-04-04 DIAGNOSIS — J869 Pyothorax without fistula: Secondary | ICD-10-CM

## 2018-04-04 MED ORDER — METOPROLOL SUCCINATE ER 50 MG PO TB24: 50.0000 mg | ORAL_TABLET | Freq: Every day | ORAL | 3 refills | Status: DC

## 2018-04-04 MED ORDER — ASPIRIN EC 81 MG PO TBEC: 81.0000 mg | DELAYED_RELEASE_TABLET | Freq: Every day | ORAL | 3 refills | Status: AC

## 2018-04-04 NOTE — Patient Instructions (Addendum)
We will schedule a cardiac cath for cardiomyopathy, shortness of breath, chest tightness/angina   Medication Instructions:  Your physician has recommended you make the following change in your medication:  1. START Aspirin 81 mg once daily 2. START Metoprolol succinate 50 mg once daily or 1/2 pill twice a day   Labwork:  Precath labs  Testing/Procedures: [facility] Cardiac Cath Instructions   You are scheduled for a Cardiac Cath on:__Friday 5/24/19___  Please arrive at _09:30__am on the day of your procedure  Please expect a call from our [facility] to pre-register you  Do not eat/drink anything after midnight  Someone will need to drive you home  It is recommended someone be with you for the first 24 hours after your procedure  Wear clothes that are easy to get on/off and wear slip on shoes if possible   Medications bring a current list of all medications with you  _X__ You may take all of your medications the morning of your procedure with enough water to swallow safely   Day of your procedure: Ar[facility] at the [facility] entrance.  Free valet service is available.  After entering the [facility] please check-in at the registration desk (1st desk on your right) to receive your armband. After receiving your armband someone will escort you to the cardiac cath/special procedures waiting area.  The usual length of stay after your procedure is about 2 to 3 hours.  This can vary.  If you have any questions, please call our office at [phone], or you may call the cardiac cath lab at [facility] directly at [phone]   No further testing at this time   Follow-Up: It was a pleasure seeing you in the office today. Please call us if you have new issues that need to be addressed before your next appt.  [phone]  Your physician wants you to follow-up in: 1 month.    If you need a refill on your cardiac medications before your next appointment, please  call your pharmacy.  For educational health videos Log in to : [email or web address] Or : [email or web address], password : [email or web address]

## 2018-04-05 LAB — CBC WITH DIFFERENTIAL/PLATELET
Basophils Absolute: 0 10*3/uL (ref 0.0–0.2)
Basos: 0 %
EOS (ABSOLUTE): 0.1 10*3/uL (ref 0.0–0.4)
Eos: 1 %
Hematocrit: 48.6 % (ref 37.5–51.0)
Hemoglobin: 16.1 g/dL (ref 13.0–17.7)
Immature Grans (Abs): 0 10*3/uL (ref 0.0–0.1)
Immature Granulocytes: 0 %
Lymphocytes Absolute: 1.2 10*3/uL (ref 0.7–3.1)
Lymphs: 13 %
MCH: 28.5 pg (ref 26.6–33.0)
MCHC: 33.1 g/dL (ref 31.5–35.7)
MCV: 86 fL (ref 79–97)
Monocytes Absolute: 0.7 10*3/uL (ref 0.1–0.9)
Monocytes: 8 %
Neutrophils Absolute: 7.3 10*3/uL — ABNORMAL HIGH (ref 1.4–7.0)
Neutrophils: 78 %
Platelets: 207 10*3/uL (ref 150–379)
RBC: 5.64 x10E6/uL (ref 4.14–5.80)
RDW: 13.8 % (ref 12.3–15.4)
WBC: 9.3 10*3/uL (ref 3.4–10.8)

## 2018-04-05 LAB — PROTIME-INR
INR: 1.1 (ref 0.8–1.2)
Prothrombin Time: 11.6 s (ref 9.1–12.0)

## 2018-04-05 LAB — BASIC METABOLIC PANEL
BUN/Creatinine Ratio: 19 (ref 10–24)
BUN: 14 mg/dL (ref 8–27)
CO2: 23 mmol/L (ref 20–29)
Calcium: 8.8 mg/dL (ref 8.6–10.2)
Chloride: 100 mmol/L (ref 96–106)
Creatinine, Ser: 0.72 mg/dL — ABNORMAL LOW (ref 0.76–1.27)
GFR calc Af Amer: 116 mL/min/{1.73_m2} (ref >59)
GFR calc non Af Amer: 100 mL/min/{1.73_m2} (ref >59)
Glucose: 269 mg/dL — ABNORMAL HIGH (ref 65–99)
Potassium: 4.5 mmol/L (ref 3.5–5.2)
Sodium: 138 mmol/L (ref 134–144)

## 2018-04-09 NOTE — Procedures (Signed)
[facility] [address] [place], [address]  DATE OF SERVICE:  Mar 20, 2018  Complete Pulmonary Function Testing Interpretation:  FINDINGS:   the forced vital capacity is mildly decreased.  FEV1 is 1.83 L and is moderately decreased.  FEV1 FVC ratio is mildly decreased.  Total lung capacity is normal residual volume is normal residual volume total lung capacity ratio is increased.  FRC is normal.  DLCO is mildly decreased.  Post bronchodilator there is no significant improvement in the FEV1 however clinical improvement may occur in the absence of spirometric improvement  IMPRESSION:   moderate obstructive lung disease No hyperinflation Mild reduction in DLCO  Yevonne Pax, MD [facility] Pulmonary Critical Care Medicine Sleep Medicine

## 2018-04-09 NOTE — Telephone Encounter (Signed)
Left message and asked for a call back regarding cardiology referral appts. Joe Berry

## 2018-04-11 ENCOUNTER — Other Ambulatory Visit: Payer: Self-pay | Admitting: Cardiovascular Disease

## 2018-04-11 ENCOUNTER — Telehealth: Payer: Self-pay

## 2018-04-11 ENCOUNTER — Telehealth: Payer: Self-pay | Admitting: Cardiovascular Disease

## 2018-04-11 DIAGNOSIS — I255 Ischemic cardiomyopathy: Secondary | ICD-10-CM

## 2018-04-11 NOTE — Telephone Encounter (Signed)
Pt called advising that he is almost out of the sample of symbicort that DSK gave at last visit.  Pt also advised that he was having a heart cath on 04/12/18 and that if they have to put stints in that it may correct his issues.  I asked pt if the symbicort seemed to be helping and he stated that he felt if wasn't really helping.  I advised pt that he should f-up with dsk and let him know about medication and that dsk may want to change to something else.  I advised that we could give another sample to hold him til appt and go from there.  dbs

## 2018-04-11 NOTE — Telephone Encounter (Signed)
Spoke with patient and confirmed procedure, instructions, and arrival time for tomorrow left heart cath at [facility] with Dr. Mariah Milling. He verbalized understanding of all information with no further questions at this time.

## 2018-04-12 ENCOUNTER — Telehealth: Payer: Self-pay | Admitting: *Deleted

## 2018-04-12 ENCOUNTER — Ambulatory Visit
Admission: RE | Admit: 2018-04-12 | Discharge: 2018-04-12 | Disposition: A | Discharge status: Home / Self Care | Payer: BLUE CROSS/BLUE SHIELD | Source: Ambulatory Visit | Attending: Cardiovascular Disease | Admitting: Cardiovascular Disease

## 2018-04-12 ENCOUNTER — Encounter
Admission: RE | Disposition: A | Discharge status: Home / Self Care | Payer: Self-pay | Source: Ambulatory Visit | Attending: Cardiovascular Disease

## 2018-04-12 DIAGNOSIS — J439 Emphysema, unspecified: Secondary | ICD-10-CM | POA: Diagnosis not present

## 2018-04-12 DIAGNOSIS — I255 Ischemic cardiomyopathy: Secondary | ICD-10-CM

## 2018-04-12 DIAGNOSIS — I200 Unstable angina: Secondary | ICD-10-CM | POA: Diagnosis present

## 2018-04-12 DIAGNOSIS — I429 Cardiomyopathy, unspecified: Secondary | ICD-10-CM | POA: Diagnosis not present

## 2018-04-12 DIAGNOSIS — Z87891 Personal history of nicotine dependence: Secondary | ICD-10-CM | POA: Diagnosis not present

## 2018-04-12 DIAGNOSIS — I25110 Atherosclerotic heart disease of native coronary artery with unstable angina pectoris: Secondary | ICD-10-CM | POA: Diagnosis not present

## 2018-04-12 DIAGNOSIS — Z7982 Long term (current) use of aspirin: Secondary | ICD-10-CM | POA: Insufficient documentation

## 2018-04-12 DIAGNOSIS — Z79899 Other long term (current) drug therapy: Secondary | ICD-10-CM | POA: Insufficient documentation

## 2018-04-12 DIAGNOSIS — I5022 Chronic systolic (congestive) heart failure: Secondary | ICD-10-CM | POA: Diagnosis not present

## 2018-04-12 DIAGNOSIS — Z8249 Family history of ischemic heart disease and other diseases of the circulatory system: Secondary | ICD-10-CM | POA: Insufficient documentation

## 2018-04-12 DIAGNOSIS — I2584 Coronary atherosclerosis due to calcified coronary lesion: Secondary | ICD-10-CM | POA: Insufficient documentation

## 2018-04-12 DIAGNOSIS — I420 Dilated cardiomyopathy: Secondary | ICD-10-CM | POA: Diagnosis present

## 2018-04-12 DIAGNOSIS — Z7951 Long term (current) use of inhaled steroids: Secondary | ICD-10-CM | POA: Insufficient documentation

## 2018-04-12 DIAGNOSIS — R0602 Shortness of breath: Secondary | ICD-10-CM | POA: Diagnosis present

## 2018-04-12 DIAGNOSIS — I428 Other cardiomyopathies: Secondary | ICD-10-CM | POA: Diagnosis present

## 2018-04-12 DIAGNOSIS — R931 Abnormal findings on diagnostic imaging of heart and coronary circulation: Secondary | ICD-10-CM

## 2018-04-12 HISTORY — PX: LEFT HEART CATH AND CORONARY ANGIOGRAPHY: CATH118249

## 2018-04-12 SURGERY — LEFT HEART CATH AND CORONARY ANGIOGRAPHY
Anesthesia: Moderate Sedation | Laterality: Left

## 2018-04-12 SURGERY — RIGHT AND LEFT HEART CATH
Anesthesia: Moderate Sedation

## 2018-04-12 MED ORDER — MIDAZOLAM HCL 2 MG/2ML IJ SOLN
INTRAMUSCULAR | Status: AC
  Filled 2018-04-12: qty 2

## 2018-04-12 MED ORDER — ASPIRIN 81 MG PO CHEW: 81.0000 mg | CHEWABLE_TABLET | ORAL | Status: DC

## 2018-04-12 MED ORDER — SODIUM CHLORIDE 0.9 % WEIGHT BASED INFUSION: 3.0000 mL/kg/h | INTRAVENOUS | Status: AC

## 2018-04-12 MED ORDER — FENTANYL CITRATE (PF) 100 MCG/2ML IJ SOLN
INTRAMUSCULAR | Status: AC
  Filled 2018-04-12: qty 2

## 2018-04-12 MED ORDER — HEPARIN (PORCINE) IN NACL 1000-0.9 UT/500ML-% IV SOLN
INTRAVENOUS | Status: AC
  Filled 2018-04-12: qty 1000

## 2018-04-12 MED ORDER — SODIUM CHLORIDE 0.9 % WEIGHT BASED INFUSION: 1.0000 mL/kg/h | INTRAVENOUS | Status: DC

## 2018-04-12 MED ORDER — LIDOCAINE HCL (PF) 1 % IJ SOLN
INTRAMUSCULAR | Status: AC
  Filled 2018-04-12: qty 30

## 2018-04-12 MED ORDER — IOPAMIDOL (ISOVUE-300) INJECTION 61%
INTRAVENOUS | Status: DC | PRN
  Administered 2018-04-12: 100 mL via INTRA_ARTERIAL

## 2018-04-12 MED ORDER — MIDAZOLAM HCL 2 MG/2ML IJ SOLN
INTRAMUSCULAR | Status: DC | PRN
  Administered 2018-04-12: 1 mg via INTRAVENOUS
  Administered 2018-04-12: 0.5 mg via INTRAVENOUS

## 2018-04-12 MED ORDER — FENTANYL CITRATE (PF) 100 MCG/2ML IJ SOLN
INTRAMUSCULAR | Status: DC | PRN
  Administered 2018-04-12: 25 ug via INTRAVENOUS
  Administered 2018-04-12: 50 ug via INTRAVENOUS

## 2018-04-12 MED ORDER — SACUBITRIL-VALSARTAN 24-26 MG PO TABS: 1.0000 | ORAL_TABLET | Freq: Two times a day (BID) | ORAL | 3 refills | Status: DC

## 2018-04-12 MED ORDER — POTASSIUM CHLORIDE CRYS ER 20 MEQ PO TBCR: 20.0000 meq | EXTENDED_RELEASE_TABLET | Freq: Every day | ORAL | 3 refills | Status: DC

## 2018-04-12 MED ORDER — FUROSEMIDE 40 MG PO TABS: 40.0000 mg | ORAL_TABLET | Freq: Every day | ORAL | 3 refills | Status: DC

## 2018-04-12 SURGICAL SUPPLY — 12 items
CATH INFINITI 5FR ANG PIGTAIL (CATHETERS) ×2 IMPLANT
CATH INFINITI 5FR JL4 (CATHETERS) ×2 IMPLANT
CATH INFINITI JR4 5F (CATHETERS) ×2 IMPLANT
CATH SWANZ 7F THERMO (CATHETERS) ×4 IMPLANT
DEVICE CLOSURE MYNXGRIP 5F (Vascular Products) ×2 IMPLANT
GUIDEWIRE EMER 3M J .025X150CM (WIRE) ×4 IMPLANT
KIT MANI 3VAL PERCEP (MISCELLANEOUS) ×2 IMPLANT
NEEDLE PERC 18GX7CM (NEEDLE) ×2 IMPLANT
PACK CARDIAC CATH (CUSTOM PROCEDURE TRAY) ×2 IMPLANT
SHEATH AVANTI 5FR X 11CM (SHEATH) ×2 IMPLANT
SHEATH AVANTI 7FRX11 (SHEATH) ×2 IMPLANT
WIRE GUIDERIGHT .035X150 (WIRE) ×2 IMPLANT

## 2018-04-12 NOTE — H&P (Signed)
H&P Addendum, precardiac catheterization  Patient was seen and evaluated prior to Cardiac catheterization procedure Symptoms, prior testing details again confirmed with the patient Patient examined, no significant change from prior exam Lab work reviewed in detail personally by myself Patient understands risk and benefit of the procedure, willing to proceed  Signed, Tim Keri Tavella, MD, Ph.D [facility]    

## 2018-04-12 NOTE — Progress Notes (Signed)
Patient clinically stable post heart cath with DR Mariah Milling, vitals stable. Right groin without bleeding nor hematoma. Denies complaints at this time. Family at bedside. Sinus rhythm per monitor. Patient and family spoken to by Dr Mariah Milling with questions answered. Will start on KCL,Potassium as well as Entresto,

## 2018-04-12 NOTE — Telephone Encounter (Signed)
Samples of Entresto 24-26mg  were left at front desk for patient, quantity 2 boxes, Lot Number [id number], exp 04/2020.

## 2018-04-12 NOTE — Telephone Encounter (Signed)
Received call from Dr Mariah Milling. He performed cath today and gave the following verbal orders: 1. Start Entresto 24-26 mg, take 1 tablet by mouth two times a day. Please give samples and coupon cards.  2. Start furosemide 40 mg by mouth once a day. 3. Start potassium 20 mEq by mouth once a day.  Prescriptions sent to pharmacy. Dr Mariah Milling said patient or family members will come by office to pick up samples later today.

## 2018-04-16 ENCOUNTER — Encounter: Payer: Self-pay | Admitting: Cardiovascular Disease

## 2018-04-29 ENCOUNTER — Ambulatory Visit: Payer: BLUE CROSS/BLUE SHIELD | Admitting: Cardiovascular Disease

## 2018-04-29 ENCOUNTER — Encounter

## 2018-05-04 DIAGNOSIS — I272 Pulmonary hypertension, unspecified: Secondary | ICD-10-CM | POA: Insufficient documentation

## 2018-05-04 NOTE — Progress Notes (Addendum)
Cardiology Office Note  Date:  05/07/2018   ID:  Joe Berry, DOB 07-13-1956, MRN [id number]  PCP:  Yevonne Pax, MD   Chief Complaint  Patient presents with  . other    Post Cardiac cath no complaints today. Meds reviewed verbally with pt.    HPI:   [name] is a 62 year old gentleman with past medical history of Smoking, quit 2008 Anxiety Coronary calcifications on CT scan Emphysema/COPD Empyema Who presents for follow-up of his dilated nonischemic cardiomyopathy Shortness of breath, Ejection fraction 25-30%  Cardiac cath for unstable angina sx on 04/12/2018 Nonobstructive disease with severely reduced LV function EF <20%, global Markedly elevated LVEDP Markedly elevated RA and RV pressures Chronic systolic CHF  Right Heart cath (very difficult procedure as unable to measure pressures in the PA and wedge) RA pressures: 25 mm Hg RV pressures: 77/17/mean of 25 LVEDP: 32 mm Hg   metoprolol succinate was continued Started on  entresto 24/26 mg po BID, lasix 40 mg daily with potassium 20 meq daily  In follow-up today reports that he feels relatively well but still has low energy 120 /80 on blood pressure, lower on today's visit Denies significant lower extremity edema  EKG personally reviewed by myself on todays visit Shows normal sinus rhythm with rate 93 bpm nonspecific T wave abnormality  Other past medical history reviewed CT scan images showing significant coronary calcification all 3 vessels  Echocardiogram edges pulled up with him in the office showing severely depressed LV function moderately dilated LV ejection fraction 25-30% dated 03/19/2018   history of sleep apnea and he is non compliant with CPAP therapy.     PMH:   has a past medical history of Anxiety and COPD (chronic obstructive pulmonary disease) (HCC).  PSH:    Past Surgical History:  Procedure Laterality Date  . LEFT HEART CATH AND CORONARY ANGIOGRAPHY Left 04/12/2018   Procedure:  LEFT HEART CATH AND CORONARY ANGIOGRAPHY;  Surgeon: Antonieta Iba, MD;  Location: [facility] INVASIVE CV LAB;  Service: Cardiovascular;  Laterality: Left;    Current Outpatient Medications  Medication Sig Dispense Refill  . aspirin EC 81 MG tablet Take 1 tablet (81 mg total) by mouth daily. 90 tablet 3  . diphenhydrAMINE HCl (ZZZQUIL) 50 MG/30ML LIQD Take 30 mLs by mouth at bedtime as needed (sleep).    . furosemide (LASIX) 40 MG tablet Take 1 tablet (40 mg total) by mouth daily. 90 tablet 0  . metoprolol succinate (TOPROL-XL) 50 MG 24 hr tablet Take 1 tablet (50 mg total) by mouth daily. Take with or immediately following a meal. 90 tablet 0  . potassium chloride SA (K-DUR,KLOR-CON) 20 MEQ tablet Take 1 tablet (20 mEq total) by mouth daily. 90 tablet 0  . sacubitril-valsartan (ENTRESTO) 24-26 MG Take 1 tablet by mouth 2 (two) times daily. 180 tablet 0   No current facility-administered medications for this visit.      Allergies:   Patient has no known allergies.   Social History:  The patient  reports that he has quit smoking. His smoking use included cigarettes. He has a 60.00 pack-year smoking history. He has never used smokeless tobacco. He reports that he does not use drugs.   Family History:   family history includes Heart Problems in his brother, maternal grandfather, and maternal grandmother; Heart disease in his maternal grandmother and mother; Lung cancer in his father.    Review of Systems: Review of Systems  Constitutional: Positive for malaise/fatigue.  Respiratory: Positive  for shortness of breath.   Cardiovascular: Negative.        Chest tightness  Gastrointestinal: Negative.   Musculoskeletal: Negative.   Neurological: Negative.   Psychiatric/Behavioral: Negative.   All other systems reviewed and are negative.    PHYSICAL EXAM: VS:  BP 110/80 (BP Location: Left Arm, Patient Position: Sitting, Cuff Size: Normal)   Pulse 93   Ht 5\' 8"  (1.727 m)   Wt 211 lb 4 oz  (95.8 kg)   BMI 32.12 kg/m  , BMI Body mass index is 32.12 kg/m. Constitutional:  oriented to person, place, and time. No distress.  HENT:  Head: Normocephalic and atraumatic.  Eyes:  no discharge. No scleral icterus.  Neck: Normal range of motion. Neck supple. No JVD present.  Cardiovascular: Normal rate, regular rhythm, normal heart sounds and intact distal pulses. Exam reveals no gallop and no friction rub. No edema No murmur heard. Pulmonary/Chest: Effort normal and breath sounds normal. No stridor. No respiratory distress.  no wheezes.  no rales.  no tenderness.  Abdominal: Soft.  no distension.  no tenderness.  Musculoskeletal: Normal range of motion.  no  tenderness or deformity.  Neurological:  normal muscle tone. Coordination normal. No atrophy Skin: Skin is warm and dry. No rash noted. not diaphoretic.  Psychiatric:  normal mood and affect. behavior is normal. Thought content normal.    Recent Labs: 04/04/2018: BUN 14; Creatinine, Ser 0.72; Hemoglobin 16.1; Platelets 207; Potassium 4.5; Sodium 138    Lipid Panel No results found for: CHOL, HDL, LDLCALC, TRIG    Wt Readings from Last 3 Encounters:  05/07/18 211 lb 4 oz (95.8 kg)  04/12/18 212 lb (96.2 kg)  04/04/18 212 lb 12 oz (96.5 kg)     ASSESSMENT AND PLAN:  Dilated cardiomyopathy (HCC) -  Nonischemic based off recent cardiac catheterization In addition to his current medications we will add spironolactone 25 mg daily Continue Lasix 40 and decreased potassium down to 10 mEq daily Continue metoprolol and entresto  Empyema lung (HCC) Stopped smoking many years ago CT scan with COPD, possible empyema Managed by pulmonary  SOB (shortness of breath) Likely multifactorial including cardiomyopathy, COPD, conditioning  Coronary artery calcification with stable angina CT scan with diffuse heavy calcification LAD, circumflex and RCA Coronary artery disease seen on cardiac catheterization He would like to check  his cholesterol prior to starting a statin Order has been placed   Total encounter time more than 25 minutes  Greater than 50% was spent in counseling and coordination of care with the patient  Disposition:   F/U 6 months     Orders Placed This Encounter  Procedures  . EKG 12-Lead     Signed, Dossie Arbour, M.D., Ph.D. 05/07/2018  [facility] [place], [place] [phone]

## 2018-05-06 ENCOUNTER — Ambulatory Visit: Payer: BLUE CROSS/BLUE SHIELD | Admitting: Cardiovascular Disease

## 2018-05-06 ENCOUNTER — Other Ambulatory Visit: Payer: Self-pay

## 2018-05-06 MED ORDER — POTASSIUM CHLORIDE CRYS ER 20 MEQ PO TBCR: 20.0000 meq | EXTENDED_RELEASE_TABLET | Freq: Every day | ORAL | 0 refills | Status: DC

## 2018-05-06 MED ORDER — FUROSEMIDE 40 MG PO TABS: 40.0000 mg | ORAL_TABLET | Freq: Every day | ORAL | 0 refills | Status: DC

## 2018-05-06 MED ORDER — SACUBITRIL-VALSARTAN 24-26 MG PO TABS: 1.0000 | ORAL_TABLET | Freq: Two times a day (BID) | ORAL | 0 refills | Status: DC

## 2018-05-06 MED ORDER — METOPROLOL SUCCINATE ER 50 MG PO TB24: 50.0000 mg | ORAL_TABLET | Freq: Every day | ORAL | 0 refills | Status: DC

## 2018-05-07 ENCOUNTER — Ambulatory Visit (INDEPENDENT_AMBULATORY_CARE_PROVIDER_SITE_OTHER): Payer: BLUE CROSS/BLUE SHIELD | Admitting: Cardiovascular Disease

## 2018-05-07 ENCOUNTER — Encounter: Payer: Self-pay | Admitting: Cardiovascular Disease

## 2018-05-07 VITALS — BP 110/80 | HR 93 | Ht 68.0 in | Wt 211.2 lb

## 2018-05-07 DIAGNOSIS — I272 Pulmonary hypertension, unspecified: Secondary | ICD-10-CM | POA: Diagnosis not present

## 2018-05-07 DIAGNOSIS — I2510 Atherosclerotic heart disease of native coronary artery without angina pectoris: Secondary | ICD-10-CM | POA: Diagnosis not present

## 2018-05-07 DIAGNOSIS — R0602 Shortness of breath: Secondary | ICD-10-CM

## 2018-05-07 DIAGNOSIS — I420 Dilated cardiomyopathy: Secondary | ICD-10-CM | POA: Diagnosis not present

## 2018-05-07 DIAGNOSIS — J869 Pyothorax without fistula: Secondary | ICD-10-CM

## 2018-05-07 MED ORDER — SPIRONOLACTONE 25 MG PO TABS: 25.0000 mg | ORAL_TABLET | Freq: Every day | ORAL | 3 refills | Status: DC

## 2018-05-07 MED ORDER — FUROSEMIDE 40 MG PO TABS: 40.0000 mg | ORAL_TABLET | Freq: Every day | ORAL | 3 refills | Status: DC

## 2018-05-07 MED ORDER — SACUBITRIL-VALSARTAN 24-26 MG PO TABS: 1.0000 | ORAL_TABLET | Freq: Two times a day (BID) | ORAL | 3 refills | Status: DC

## 2018-05-07 MED ORDER — METOPROLOL SUCCINATE ER 50 MG PO TB24: 50.0000 mg | ORAL_TABLET | Freq: Every day | ORAL | 3 refills | Status: DC

## 2018-05-07 MED ORDER — POTASSIUM CHLORIDE ER 10 MEQ PO TBCR: 10.0000 meq | EXTENDED_RELEASE_TABLET | Freq: Every day | ORAL | 3 refills | Status: DC

## 2018-05-07 NOTE — Patient Instructions (Addendum)
Medication Instructions:   Start spironolactone 25 mg daily Decrease the potassium down to 10 meq (1/2 pill) daily  Labwork:   In 2 - 3 weeks Fasting lipids, HBA1C, BMP, PSA (lab slip to go)  Testing/Procedures:  Call when you would like a echocardiogram, wait 2 to 3 months (Limited echo)   Follow-Up: It was a pleasure seeing you in the office today. Please call us if you have new issues that need to be addressed before your next appt.  [phone]  Your physician wants you to follow-up in: 6 months.  You will receive a reminder letter in the mail two months in advance. If you don't receive a letter, please call our office to schedule the follow-up appointment.  If you need a refill on your cardiac medications before your next appointment, please call your pharmacy.  For educational health videos Log in to : [email or web address] Or : [email or web address], password : [email or web address]

## 2018-05-29 ENCOUNTER — Ambulatory Visit: Payer: BLUE CROSS/BLUE SHIELD | Admitting: Cardiovascular Disease

## 2018-06-13 ENCOUNTER — Other Ambulatory Visit
Admission: RE | Admit: 2018-06-13 | Discharge: 2018-06-13 | Disposition: A | Discharge status: Home / Self Care | Payer: BLUE CROSS/BLUE SHIELD | Source: Ambulatory Visit | Attending: Cardiovascular Disease | Admitting: Cardiovascular Disease

## 2018-06-13 DIAGNOSIS — I420 Dilated cardiomyopathy: Secondary | ICD-10-CM

## 2018-06-13 DIAGNOSIS — R0602 Shortness of breath: Secondary | ICD-10-CM | POA: Diagnosis not present

## 2018-06-13 DIAGNOSIS — I2510 Atherosclerotic heart disease of native coronary artery without angina pectoris: Secondary | ICD-10-CM | POA: Diagnosis not present

## 2018-06-13 DIAGNOSIS — I272 Pulmonary hypertension, unspecified: Secondary | ICD-10-CM | POA: Diagnosis not present

## 2018-06-13 LAB — LIPID PANEL
Cholesterol: 204 mg/dL — ABNORMAL HIGH (ref 0–200)
HDL: 37 mg/dL — ABNORMAL LOW (ref >40)
LDL Cholesterol: 135 mg/dL — ABNORMAL HIGH (ref 0–99)
Total CHOL/HDL Ratio: 5.5 RATIO
Triglycerides: 158 mg/dL — ABNORMAL HIGH (ref <150)
VLDL: 32 mg/dL (ref 0–40)

## 2018-06-13 LAB — BASIC METABOLIC PANEL
Anion gap: 11 (ref 5–15)
BUN: 17 mg/dL (ref 8–23)
CO2: 28 mmol/L (ref 22–32)
Calcium: 8.9 mg/dL (ref 8.9–10.3)
Chloride: 97 mmol/L — ABNORMAL LOW (ref 98–111)
Creatinine, Ser: 0.73 mg/dL (ref 0.61–1.24)
GFR calc Af Amer: >60 mL/min (ref >60)
GFR calc non Af Amer: >60 mL/min (ref >60)
Glucose, Bld: 375 mg/dL — ABNORMAL HIGH (ref 70–99)
Potassium: 4.4 mmol/L (ref 3.5–5.1)
Sodium: 136 mmol/L (ref 135–145)

## 2018-06-13 LAB — HEMOGLOBIN A1C
Hgb A1c MFr Bld: 13.9 % — ABNORMAL HIGH (ref 4.8–5.6)
Mean Plasma Glucose: 352.23 mg/dL

## 2018-06-13 LAB — PSA: Prostatic Specific Antigen: 0.68 ng/mL (ref 0.00–4.00)

## 2018-06-17 ENCOUNTER — Telehealth: Payer: Self-pay | Admitting: Cardiovascular Disease

## 2018-06-17 NOTE — Telephone Encounter (Signed)
I spoke with the patient.  He is aware of his lab results and Dr. Windell Hummingbird recommendations.  Copy of most recent labs fowarded to Dr. Carolynne Edouard (PCP) via Epic for upcoming appointment on Thursday.

## 2018-06-17 NOTE — Telephone Encounter (Signed)
Patient calling to check status of lab results Would like to know if results can be passed on to Joe Berry for him start addressing at appointment this Thursday Please call to discuss

## 2018-06-20 ENCOUNTER — Ambulatory Visit: Payer: BLUE CROSS/BLUE SHIELD | Admitting: Internal Medicine

## 2018-06-20 ENCOUNTER — Encounter: Payer: Self-pay | Admitting: Internal Medicine

## 2018-06-20 VITALS — BP 123/75 | HR 89 | Ht 68.0 in | Wt 211.6 lb

## 2018-06-20 DIAGNOSIS — G4733 Obstructive sleep apnea (adult) (pediatric): Secondary | ICD-10-CM

## 2018-06-20 DIAGNOSIS — I5021 Acute systolic (congestive) heart failure: Secondary | ICD-10-CM | POA: Diagnosis not present

## 2018-06-20 DIAGNOSIS — R918 Other nonspecific abnormal finding of lung field: Secondary | ICD-10-CM

## 2018-06-20 DIAGNOSIS — J449 Chronic obstructive pulmonary disease, unspecified: Secondary | ICD-10-CM | POA: Diagnosis not present

## 2018-06-20 NOTE — Progress Notes (Signed)
[facility], [place] [address]  Pulmonary Sleep Medicine   Office Visit Note  Patient Name: Joe Berry DOB: 05-Aug-1956 MRN [id number]  Date of Service: 06/20/2018  Complaints/HPI: Pt here follow up. He reports he has seen cardiology and they are following him for his systolic heart failure.  He denies breathing difficulty and reports he has stopped using his Symbicort sample, because after one month he did not notice a difference. He reports since stopping he still feels at his baseline, and if looking forward to assistance with his sleep apnea.   ROS  General: (-) fever, (-) chills, (-) night sweats, (-) weakness Skin: (-) rashes, (-) itching,. Eyes: (-) visual changes, (-) redness, (-) itching. Nose and Sinuses: (-) nasal stuffiness or itchiness, (-) postnasal drip, (-) nosebleeds, (-) sinus trouble. Mouth and Throat: (-) sore throat, (-) hoarseness. Neck: (-) swollen glands, (-) enlarged thyroid, (-) neck pain. Respiratory: - cough, (-) bloody sputum, - shortness of breath, - wheezing. Cardiovascular: - ankle swelling, (-) chest pain. Lymphatic: (-) lymph node enlargement. Neurologic: (-) numbness, (-) tingling. Psychiatric: (-) anxiety, (-) depression   Current Medication: Outpatient Encounter Medications as of 06/20/2018  Medication Sig  . aspirin EC 81 MG tablet Take 1 tablet (81 mg total) by mouth daily.  . diphenhydrAMINE HCl (ZZZQUIL) 50 MG/30ML LIQD Take 30 mLs by mouth at bedtime as needed (sleep).  . furosemide (LASIX) 40 MG tablet Take 1 tablet (40 mg total) by mouth daily.  . metoprolol succinate (TOPROL-XL) 50 MG 24 hr tablet Take 1 tablet (50 mg total) by mouth daily. Take with or immediately following a meal.  . potassium chloride (K-DUR) 10 MEQ tablet Take 1 tablet (10 mEq total) by mouth daily. (Patient taking differently: Take 20 mEq by mouth daily. )  . sacubitril-valsartan (ENTRESTO) 24-26 MG Take 1 tablet by mouth 2 (two)  times daily.  Marland Kitchen spironolactone (ALDACTONE) 25 MG tablet Take 1 tablet (25 mg total) by mouth daily.   No facility-administered encounter medications on file as of 06/20/2018.     Surgical History: Past Surgical History:  Procedure Laterality Date  . LEFT HEART CATH AND CORONARY ANGIOGRAPHY Left 04/12/2018   Procedure: LEFT HEART CATH AND CORONARY ANGIOGRAPHY;  Surgeon: Minna Merritts, MD;  Location: [place] [facility];  Service: Cardiovascular;  Laterality: Left;    Medical History: Past Medical History:  Diagnosis Date  . Anxiety   . COPD (chronic obstructive pulmonary disease) (HCC)     Family History: Family History  Problem Relation Age of Onset  . Heart disease Mother   . Lung cancer Father   . Heart disease Maternal Grandmother   . Heart Problems Maternal Grandmother   . Heart Problems Brother   . Heart Problems Maternal Grandfather     Social History: Social History   Socioeconomic History  . Marital status: Single    Spouse name: Not on file  . Number of children: Not on file  . Years of education: Not on file  . Highest education level: Not on file  Occupational History  . Not on file  Social Needs  . Financial resource strain: Not on file  . Food insecurity:    Worry: Not on file    Inability: Not on file  . Transportation needs:    Medical: Not on file    Non-medical: Not on file  Tobacco Use  . Smoking status: Former Smoker    Packs/day: 1.50    Years:  40.00    Pack years: 60.00    Types: Cigarettes  . Smokeless tobacco: Never Used  . Tobacco comment: quit 2008  Substance and Sexual Activity  . Alcohol use: Not on file    Comment: occasional drink  . Drug use: Never  . Sexual activity: Not on file  Lifestyle  . Physical activity:    Days per week: Not on file    Minutes per session: Not on file  . Stress: Not on file  Relationships  . Social connections:    Talks on phone: Not on file    Gets together: Not on file    Attends  religious service: Not on file    Active member of club or organization: Not on file    Attends meetings of clubs or organizations: Not on file    Relationship status: Not on file  . Intimate partner violence:    Fear of current or ex partner: Not on file    Emotionally abused: Not on file    Physically abused: Not on file    Forced sexual activity: Not on file  Other Topics Concern  . Not on file  Social History Narrative  . Not on file    Vital Signs: Blood pressure 123/75, pulse 89, height '5\' 8"'  (1.727 m), weight 211 lb 9.6 oz (96 kg), SpO2 90 %.  Examination: General Appearance: The patient is well-developed, well-nourished, and in no distress. Skin: Gross inspection of skin unremarkable. Head: normocephalic, no gross deformities. Eyes: no gross deformities noted. ENT: ears appear grossly normal no exudates. Neck: Supple. No thyromegaly. No LAD. Respiratory: no rhonchi noted. Cardiovascular: Normal S1 and S2 without murmur or rub. Extremities: No cyanosis. pulses are equal. Neurologic: Alert and oriented. No involuntary movements.  LABS: Recent Results (from the past 2160 hour(s))  CBC with Differential/Platelet     Status: Abnormal   Collection Time: 04/04/18  2:57 PM  Result Value Ref Range   WBC 9.3 3.4 - 10.8 x10E3/uL   RBC 5.64 4.14 - 5.80 x10E6/uL   Hemoglobin 16.1 13.0 - 17.7 g/dL   Hematocrit 48.6 37.5 - 51.0 %   MCV 86 79 - 97 fL   MCH 28.5 26.6 - 33.0 pg   MCHC 33.1 31.5 - 35.7 g/dL   RDW 13.8 12.3 - 15.4 %   Platelets 207 150 - 379 x10E3/uL   Neutrophils 78 Not Estab. %   Lymphs 13 Not Estab. %   Monocytes 8 Not Estab. %   Eos 1 Not Estab. %   Basos 0 Not Estab. %   Neutrophils Absolute 7.3 (H) 1.4 - 7.0 x10E3/uL   Lymphocytes Absolute 1.2 0.7 - 3.1 x10E3/uL   Monocytes Absolute 0.7 0.1 - 0.9 x10E3/uL   EOS (ABSOLUTE) 0.1 0.0 - 0.4 x10E3/uL   Basophils Absolute 0.0 0.0 - 0.2 x10E3/uL   Immature Granulocytes 0 Not Estab. %   Immature Grans (Abs)  0.0 0.0 - 0.1 [id number]/BE  Basic metabolic panel     Status: Abnormal   Collection Time: 04/04/18  2:57 PM  Result Value Ref Range   Glucose 269 (H) 65 - 99 mg/dL   BUN 14 8 - 27 mg/dL   Creatinine, Ser 0.72 (L) 0.76 - 1.27 mg/dL   GFR calc non Af Amer 100 >59 mL/min/1.73   GFR calc Af Amer 116 >59 mL/min/1.73   BUN/Creatinine Ratio 19 10 - 24   Sodium 138 134 - 144 mmol/L   Potassium 4.5 3.5 - 5.2 mmol/L  Chloride 100 96 - 106 mmol/L   CO2 23 20 - 29 mmol/L   Calcium 8.8 8.6 - 10.2 mg/dL  INR/PT     Status: None   Collection Time: 04/04/18  2:57 PM  Result Value Ref Range   INR 1.1 0.8 - 1.2    Comment: Reference interval is for non-anticoagulated patients. Suggested INR therapeutic range for Vitamin K antagonist therapy:    Standard Dose (moderate intensity                   therapeutic range):       2.0 - 3.0    Higher intensity therapeutic range       2.5 - 3.5    Prothrombin Time 11.6 9.1 - 12.0 sec  PSA     Status: None   Collection Time: 06/13/18  9:00 AM  Result Value Ref Range   Prostatic Specific Antigen 0.68 0.00 - 4.00 ng/mL    Comment: (NOTE) While PSA levels of <=4.0 ng/ml are reported as reference range, some men with levels below 4.0 ng/ml can have prostate cancer and many men with PSA above 4.0 ng/ml do not have prostate cancer.  Other tests such as free PSA, age specific reference ranges, PSA velocity and PSA doubling time may be helpful especially in men less than 47 years old. Performed at [facility], [place] [address] [place], [place] [address]   Basic metabolic panel     Status: Abnormal   Collection Time: 06/13/18  9:00 AM  Result Value Ref Range   Sodium 136 135 - 145 mmol/L   Potassium 4.4 3.5 - 5.1 mmol/L   Chloride 97 (L) 98 - 111 mmol/L   CO2 28 22 - 32 mmol/L   Glucose, Bld 375 (H) 70 - 99 mg/dL   BUN 17 8 - 23 mg/dL   Creatinine, Ser 0.73 0.61 - 1.24 mg/dL   Calcium 8.9 8.9 - 10.3 mg/dL   GFR calc non Af Amer >60 >60 mL/min   GFR  calc Af Amer >60 >60 mL/min    Comment: (NOTE) The eGFR has been calculated using the CKD EPI equation. This calculation has not been validated in all clinical situations. eGFR's persistently <60 mL/min signify possible Chronic Kidney Disease.    Anion gap 11 5 - 15    Comment: Performed at [facility], [address] [place], [place] [address]  Lipid panel     Status: Abnormal   Collection Time: 06/13/18  9:00 AM  Result Value Ref Range   Cholesterol 204 (H) 0 - 200 mg/dL   Triglycerides 158 (H) <150 mg/dL   HDL 37 (L) >40 mg/dL   Total CHOL/HDL Ratio 5.5 RATIO   VLDL 32 0 - 40 mg/dL   LDL Cholesterol 135 (H) 0 - 99 mg/dL    Comment:        Total Cholesterol/HDL:CHD Risk Coronary Heart Disease Risk Table                     Men   Women  1/2 Average Risk   3.4   3.3  Average Risk       5.0   4.4  2 X Average Risk   9.6   7.1  3 X Average Risk  23.4   11.0        Use the calculated Patient Ratio above and the CHD Risk Table to determine the patient's CHD Risk.  ATP III CLASSIFICATION (LDL):  <100     mg/dL   Optimal  100-129  mg/dL   Near or Above                    Optimal  130-159  mg/dL   Borderline  160-189  mg/dL   High  >190     mg/dL   Very High Performed at [facility], [address] [place], [place] [address]   Hemoglobin A1c     Status: Abnormal   Collection Time: 06/13/18  9:00 AM  Result Value Ref Range   Hgb A1c MFr Bld 13.9 (H) 4.8 - 5.6 %    Comment: (NOTE) Pre diabetes:          5.7%-6.4% Diabetes:              >6.4% Glycemic control for   <7.0% adults with diabetes    Mean Plasma Glucose 352.23 mg/dL    Comment: Performed at [facility] [address], [place], [place] [address]    Radiology: No results found.  No results found.  No results found.    Assessment and Plan: Patient Active Problem List   Diagnosis Date Noted  . Pulmonary HTN ([place]) 05/04/2018  . Unstable angina ([place]) 04/04/2018  .  Dilated cardiomyopathy ([place]) 04/03/2018  . Coronary artery calcification seen on CAT scan 04/03/2018  . Empyema lung ([place]) 03/12/2018  . Pleural effusion 03/12/2018  . Pleural plaque without asbestos 03/12/2018  . SOB (shortness of breath) 03/12/2018  . Lung mass 03/12/2018    1. Chronic obstructive pulmonary disease, unspecified COPD type ([place]) He has stopped his Symbicort.  States he didn't feel effectiveness. Has stopped for now. No exacerbations.  2. Acute systolic CHF (congestive heart failure) (HCC) Continue to follow up with Dr. Rockey Situ for repeat echo.    3. Lung mass Continue to monitor.   4. OSA (obstructive sleep apnea) - Home sleep test Pt to establish primary care, for control of diabetes. At his request.    General Counseling: I have discussed the findings of the evaluation and examination with Louie Casa.  I have also discussed any further diagnostic evaluation thatmay be needed or ordered today. Claudis verbalizes understanding of the findings of todays visit. We also reviewed his medications today and discussed drug interactions and side effects including but not limited excessive drowsiness and altered mental states. We also discussed that there is always a risk not just to him but also people around him. he has been encouraged to call the office with any questions or concerns that should arise related to todays visit.    Time spent: 25  I have personally obtained a history, examined the patient, evaluated laboratory and imaging results, formulated the assessment and plan and placed orders.    Allyne Gee, MD [facility] Pulmonary and Critical Care Sleep medicine

## 2018-06-21 ENCOUNTER — Encounter: Payer: Self-pay | Admitting: Internal Medicine

## 2018-06-21 NOTE — Patient Instructions (Signed)

## 2018-06-26 ENCOUNTER — Other Ambulatory Visit: Payer: Self-pay | Admitting: Adult Health

## 2018-06-26 ENCOUNTER — Ambulatory Visit: Payer: BLUE CROSS/BLUE SHIELD | Admitting: Adult Health

## 2018-06-26 ENCOUNTER — Encounter: Payer: Self-pay | Admitting: Adult Health

## 2018-06-26 VITALS — BP 110/78 | HR 86 | Resp 16 | Ht 68.0 in | Wt 209.4 lb

## 2018-06-26 DIAGNOSIS — I5021 Acute systolic (congestive) heart failure: Secondary | ICD-10-CM | POA: Diagnosis not present

## 2018-06-26 DIAGNOSIS — G4733 Obstructive sleep apnea (adult) (pediatric): Secondary | ICD-10-CM | POA: Diagnosis not present

## 2018-06-26 DIAGNOSIS — E13649 Other specified diabetes mellitus with hypoglycemia without coma: Secondary | ICD-10-CM | POA: Diagnosis not present

## 2018-06-26 MED ORDER — ACCU-CHEK FASTCLIX LANCETS MISC: 11 refills | Status: AC

## 2018-06-26 MED ORDER — GLUCOSE BLOOD VI STRP: ORAL_STRIP | 11 refills | Status: DC

## 2018-06-26 MED ORDER — GLUCOSE BLOOD VI STRP: ORAL_STRIP | 0 refills | Status: DC

## 2018-06-26 MED ORDER — METFORMIN HCL 500 MG PO TABS: 500.0000 mg | ORAL_TABLET | Freq: Two times a day (BID) | ORAL | 3 refills | Status: DC

## 2018-06-26 MED ORDER — ACCU-CHEK FASTCLIX LANCETS MISC: 0 refills | Status: DC

## 2018-06-26 NOTE — Patient Instructions (Signed)
Diabetes Mellitus and Nutrition When you have diabetes (diabetes mellitus), it is very important to have healthy eating habits because your blood sugar (glucose) levels are greatly affected by what you eat and drink. Eating healthy foods in the appropriate amounts, at about the same times every day, can help you:  Control your blood glucose.  Lower your risk of heart disease.  Improve your blood pressure.  Reach or maintain a healthy weight.  Every person with diabetes is different, and each person has different needs for a meal plan. Your health care provider may recommend that you work with a diet and nutrition specialist (dietitian) to make a meal plan that is best for you. Your meal plan may vary depending on factors such as:  The calories you need.  The medicines you take.  Your weight.  Your blood glucose, blood pressure, and cholesterol levels.  Your activity level.  Other health conditions you have, such as heart or kidney disease.  How do carbohydrates affect me? Carbohydrates affect your blood glucose level more than any other type of food. Eating carbohydrates naturally increases the amount of glucose in your blood. Carbohydrate counting is a method for keeping track of how many carbohydrates you eat. Counting carbohydrates is important to keep your blood glucose at a healthy level, especially if you use insulin or take certain oral diabetes medicines. It is important to know how many carbohydrates you can safely have in each meal. This is different for every person. Your dietitian can help you calculate how many carbohydrates you should have at each meal and for snack. Foods that contain carbohydrates include:  Bread, cereal, rice, pasta, and crackers.  Potatoes and corn.  Peas, beans, and lentils.  Milk and yogurt.  Fruit and juice.  Desserts, such as cakes, cookies, ice cream, and candy.  How does alcohol affect me? Alcohol can cause a sudden decrease in blood  glucose (hypoglycemia), especially if you use insulin or take certain oral diabetes medicines. Hypoglycemia can be a life-threatening condition. Symptoms of hypoglycemia (sleepiness, dizziness, and confusion) are similar to symptoms of having too much alcohol. If your health care provider says that alcohol is safe for you, follow these guidelines:  Limit alcohol intake to no more than 1 drink per day for nonpregnant women and 2 drinks per day for men. One drink equals 12 oz of beer, 5 oz of wine, or 1 oz of hard liquor.  Do not drink on an empty stomach.  Keep yourself hydrated with water, diet soda, or unsweetened iced tea.  Keep in mind that regular soda, juice, and other mixers may contain a lot of sugar and must be counted as carbohydrates.  What are tips for following this plan? Reading food labels  Start by checking the serving size on the label. The amount of calories, carbohydrates, fats, and other nutrients listed on the label are based on one serving of the food. Many foods contain more than one serving per package.  Check the total grams (g) of carbohydrates in one serving. You can calculate the number of servings of carbohydrates in one serving by dividing the total carbohydrates by 15. For example, if a food has 30 g of total carbohydrates, it would be equal to 2 servings of carbohydrates.  Check the number of grams (g) of saturated and trans fats in one serving. Choose foods that have low or no amount of these fats.  Check the number of milligrams (mg) of sodium in one serving. Most people   should limit total sodium intake to less than 2,300 mg per day.  Always check the nutrition information of foods labeled as "low-fat" or "nonfat". These foods may be higher in added sugar or refined carbohydrates and should be avoided.  Talk to your dietitian to identify your daily goals for nutrients listed on the label. Shopping  Avoid buying canned, premade, or processed foods. These  foods tend to be high in fat, sodium, and added sugar.  Shop around the outside edge of the grocery store. This includes fresh fruits and vegetables, bulk grains, fresh meats, and fresh dairy. Cooking  Use low-heat cooking methods, such as baking, instead of high-heat cooking methods like deep frying.  Cook using healthy oils, such as olive, canola, or sunflower oil.  Avoid cooking with butter, cream, or high-fat meats. Meal planning  Eat meals and snacks regularly, preferably at the same times every day. Avoid going long periods of time without eating.  Eat foods high in fiber, such as fresh fruits, vegetables, beans, and whole grains. Talk to your dietitian about how many servings of carbohydrates you can eat at each meal.  Eat 4-6 ounces of lean protein each day, such as lean meat, chicken, fish, eggs, or tofu. 1 ounce is equal to 1 ounce of meat, chicken, or fish, 1 egg, or 1/4 cup of tofu.  Eat some foods each day that contain healthy fats, such as avocado, nuts, seeds, and fish. Lifestyle   Check your blood glucose regularly.  Exercise at least 30 minutes 5 or more days each week, or as told by your health care provider.  Take medicines as told by your health care provider.  Do not use any products that contain nicotine or tobacco, such as cigarettes and e-cigarettes. If you need help quitting, ask your health care provider.  Work with a counselor or diabetes educator to identify strategies to manage stress and any emotional and social challenges. What are some questions to ask my health care provider?  Do I need to meet with a diabetes educator?  Do I need to meet with a dietitian?  What number can I call if I have questions?  When are the best times to check my blood glucose? Where to find more information:  American Diabetes Association: diabetes.org/food-and-fitness/food  Academy of Nutrition and Dietetics:  www.eatright.org/resources/health/diseases-and-conditions/diabetes  National Institute of Diabetes and Digestive and Kidney Diseases (NIH): www.niddk.nih.gov/health-information/diabetes/overview/diet-eating-physical-activity Summary  A healthy meal plan will help you control your blood glucose and maintain a healthy lifestyle.  Working with a diet and nutrition specialist (dietitian) can help you make a meal plan that is best for you.  Keep in mind that carbohydrates and alcohol have immediate effects on your blood glucose levels. It is important to count carbohydrates and to use alcohol carefully. This information is not intended to replace advice given to you by your health care provider. Make sure you discuss any questions you have with your health care provider. Document Released: 08/03/2005 Document Revised: 12/11/2016 Document Reviewed: 12/11/2016 Elsevier Interactive Patient Education  2018 [employer]  

## 2018-06-26 NOTE — Progress Notes (Signed)
[facility] [address] [place], [place] [address]  Internal MEDICINE  Office Visit Note  Patient Name: Joe Berry  [id number]  [id number]  Date of Service: 06/26/2018   Complaints/HPI Pt is here for establishment of PCP. Chief Complaint  Patient presents with  . New Patient (Initial Visit)    establish pcp   . Diabetes    possible diabetes, dr Mariah Milling tested a1c it was 13.9  . Congestive Heart Failure   HPI Pt here to establish care.  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  He is here today to start treatment and establish care. He reports he has drastically changed his diet.  He has lost 2 pounds since his pulmonary vist 1.5 weeks ago.    Current Medication: Outpatient Encounter Medications as of 06/26/2018  Medication Sig  . aspirin EC 81 MG tablet Take 1 tablet (81 mg total) by mouth daily.  . diphenhydrAMINE HCl (ZZZQUIL) 50 MG/30ML LIQD Take 30 mLs by mouth at bedtime as needed (sleep).  . furosemide (LASIX) 40 MG tablet Take 1 tablet (40 mg total) by mouth daily.  . metoprolol succinate (TOPROL-XL) 50 MG 24 hr tablet Take 1 tablet (50 mg total) by mouth daily. Take with or immediately following a meal.  . potassium chloride (K-DUR) 10 MEQ tablet Take 1 tablet (10 mEq total) by mouth daily. (Patient taking differently: Take 20 mEq by mouth daily. )  . sacubitril-valsartan (ENTRESTO) 24-26 MG Take 1 tablet by mouth 2 (two) times daily.  Marland Kitchen spironolactone (ALDACTONE) 25 MG tablet Take 1 tablet (25 mg total) by mouth daily.   No facility-administered encounter medications on file as of 06/26/2018.     Surgical History: Past Surgical History:  Procedure Laterality Date  . LEFT HEART CATH AND CORONARY ANGIOGRAPHY Left 04/12/2018   Procedure: LEFT HEART CATH AND CORONARY ANGIOGRAPHY;  Surgeon: Antonieta Iba, MD;  Location: [facility] INVASIVE CV LAB;  Service: Cardiovascular;  Laterality: Left;    Medical History: Past Medical History:  Diagnosis  Date  . Anxiety   . Cataract   . CHF (congestive heart failure) (HCC)   . COPD (chronic obstructive pulmonary disease) (HCC)     Family History: Family History  Problem Relation Age of Onset  . Heart disease Mother   . Lung cancer Father   . Heart disease Maternal Grandmother   . Heart Problems Maternal Grandmother   . Heart Problems Brother   . Heart Problems Maternal Grandfather     Social History   Socioeconomic History  . Marital status: Single    Spouse name: Not on file  . Number of children: Not on file  . Years of education: Not on file  . Highest education level: Not on file  Occupational History  . Not on file  Social Needs  . Financial resource strain: Not on file  . Food insecurity:    Worry: Not on file    Inability: Not on file  . Transportation needs:    Medical: Not on file    Non-medical: Not on file  Tobacco Use  . Smoking status: Former Smoker    Packs/day: 1.50    Years: 40.00    Pack years: 60.00    Types: Cigarettes  . Smokeless tobacco: Never Used  . Tobacco comment: quit 2008  Substance and Sexual Activity  . Alcohol use: Not on file    Comment: occasional drink  . Drug use: Never  . Sexual activity:  Not on file  Lifestyle  . Physical activity:    Days per week: Not on file    Minutes per session: Not on file  . Stress: Not on file  Relationships  . Social connections:    Talks on phone: Not on file    Gets together: Not on file    Attends religious service: Not on file    Active member of club or organization: Not on file    Attends meetings of clubs or organizations: Not on file    Relationship status: Not on file  . Intimate partner violence:    Fear of current or ex partner: Not on file    Emotionally abused: Not on file    Physically abused: Not on file    Forced sexual activity: Not on file  Other Topics Concern  . Not on file  Social History Narrative  . Not on file     Review of Systems  Constitutional:  Negative.  Negative for chills, fatigue and unexpected weight change.  HENT: Negative.  Negative for congestion, rhinorrhea, sneezing and sore throat.   Eyes: Negative for redness.  Respiratory: Negative.  Negative for cough, chest tightness and shortness of breath.   Cardiovascular: Negative.  Negative for chest pain and palpitations.  Gastrointestinal: Negative.  Negative for abdominal pain, constipation, diarrhea, nausea and vomiting.  Endocrine: Negative.   Genitourinary: Negative.  Negative for dysuria and frequency.  Musculoskeletal: Negative.  Negative for arthralgias, back pain, joint swelling and neck pain.  Skin: Negative.  Negative for rash.  Allergic/Immunologic: Negative.   Neurological: Negative.  Negative for tremors and numbness.  Hematological: Negative for adenopathy. Does not bruise/bleed easily.  Psychiatric/Behavioral: Negative.  Negative for behavioral problems, sleep disturbance and suicidal ideas. The patient is not nervous/anxious.     Vital Signs: BP 110/78   Pulse 86   Resp 16   Ht 5\' 8"  (1.727 m)   Wt 209 lb 6.4 oz (95 kg)   SpO2 93%   BMI 31.84 kg/m    Physical Exam  Constitutional: He is oriented to person, place, and time. He appears well-developed and well-nourished. No distress.  HENT:  Head: Normocephalic and atraumatic.  Mouth/Throat: Oropharynx is clear and moist. No oropharyngeal exudate.  Eyes: Pupils are equal, round, and reactive to light. EOM are normal.  Neck: Normal range of motion. Neck supple. No JVD present. No tracheal deviation present. No thyromegaly present.  Cardiovascular: Normal rate, regular rhythm and normal heart sounds. Exam reveals no gallop and no friction rub.  No murmur heard. Pulmonary/Chest: Effort normal and breath sounds normal. No respiratory distress. He has no wheezes. He has no rales. He exhibits no tenderness.  Abdominal: Soft. There is no tenderness. There is no guarding.  Musculoskeletal: Normal range of  motion.  Lymphadenopathy:    He has no cervical adenopathy.  Neurological: He is alert and oriented to person, place, and time. No cranial nerve deficit.  Skin: Skin is warm and dry. He is not diaphoretic.  Psychiatric: He has a normal mood and affect. His behavior is normal. Judgment and thought content normal.  Nursing note and vitals reviewed.  Assessment/Plan: 1. Uncontrolled diabetes mellitus of other type with hypoglycemia, unspecified hypoglycemia coma status (HCC) Meter given,instructed on use.  Strips and lancets ordered. Pt started on Metformin 500mg  PO BID. Pt to keep log of blood sugars morning, and pre-dinner.  Will bring to next appointment in 2 months for A1c recheck.  He refused Second Diabetic agent  at this time.  Wants to continue to diet.   Diabetes Counseling:  1. Addition of ACE inh/ ARB'S for nephroprotection. Microalbumin is updated  2. Diabetic foot care, prevention of complications. Podiatry consult 3. Exercise and lose weight.  4. Diabetic eye examination, Diabetic eye exam is updated  5. Monitor blood sugar closlely. nutrition counseling.  6. Sign and symptoms of hypoglycemia including shaking sweating,confusion and headaches.    2. OSA (obstructive sleep apnea) Continue current therapy.  3. Acute systolic CHF (congestive heart failure) (HCC) Stable, sees cardiology.  Continue current therapy.   General Counseling: dolton shaker understanding of the findings of todays visit and agrees with plan of treatment. I have discussed any further diagnostic evaluation that may be needed or ordered today. We also reviewed his medications today. he has been encouraged to call the office with any questions or concerns that should arise related to todays visit.  No orders of the defined types were placed in this encounter.   No orders of the defined types were placed in this encounter.   Time spent: 25 Minutes   This patient was seen by Blima Ledger AGNP-C in  Collaboration with Dr Lyndon Code as a part of collaborative care agreement

## 2018-06-27 ENCOUNTER — Other Ambulatory Visit: Payer: Self-pay | Admitting: Adult Health

## 2018-06-27 ENCOUNTER — Encounter: Payer: Self-pay | Admitting: Adult Health

## 2018-07-08 ENCOUNTER — Other Ambulatory Visit: Payer: Self-pay | Admitting: Internal Medicine

## 2018-07-10 ENCOUNTER — Other Ambulatory Visit: Payer: Self-pay | Admitting: Adult Health

## 2018-07-10 ENCOUNTER — Other Ambulatory Visit: Payer: BLUE CROSS/BLUE SHIELD | Admitting: Internal Medicine

## 2018-07-10 DIAGNOSIS — G4710 Hypersomnia, unspecified: Secondary | ICD-10-CM | POA: Diagnosis not present

## 2018-07-10 MED ORDER — GLUCOSE BLOOD VI STRP: ORAL_STRIP | 11 refills | Status: DC

## 2018-07-10 MED ORDER — ONETOUCH VERIO W/DEVICE KIT: PACK | 0 refills | Status: DC

## 2018-07-10 NOTE — Telephone Encounter (Signed)
farxiga samples given by adam

## 2018-07-11 ENCOUNTER — Other Ambulatory Visit: Payer: Self-pay | Admitting: Adult Health

## 2018-07-11 NOTE — Progress Notes (Unsigned)
Pt concerned because his Morning blood sugars continue to be over 200mg /dl.  Farxiga 5mg  daily started.  Samples given.

## 2018-07-17 NOTE — Procedures (Signed)
[facility] [address] [place], [place] [address]  Sleep Specialist: Yevonne Pax, MD [facility]  Home Sleep Study Interpretation  Patient Name: Joe Berry Patient MR [id number] DOB:1955-12-24  Date of Study: July 10, 2018  Indications for study:  hypersomnia  BMI:  31.7       Respiratory Data:  Total AHI:  8.2  Total Obstructive Apneas:  12  Total Central Apneas:  0  Total Mixed Apneas:  0  Total Hypopneas:  43  If the AHI is greater than 5 per hour patient qualifies for PAP evaluation  Oximetry Data:  Oxygen Desaturation Index: 12.4 per hour  Lowest Desaturation:  60%  Cardiac Data:  Minimum Heart Rate:  70  Maximum Heart Rate:  110   Impression / Diagnosis:    This study shows presence of mild obstructive sleep apnea with an apnea-hypopnea index of 8.2 per hour.  Patient has severe oxygen desaturations noted.  Patient would benefit from 8 CPAP titration study.  GENERAL Recommendations:  1.  Consider Auto PAP with pressure ranges 5-20 cmH20 with download, or facility based PAP Titration Study  2.  Consider PAP interface mask fitted for patient comfort, Heated Humidification & PAP compliance monitoring (1 month, 3 months & 12 months after PAP initiation)  3. Consider treatment with mandibular advancement splint (MAS) or referral to an ENT surgeon for modification to the upper airway if the patient prefers an alternate therapy or the PAP trial is unsuccessful  4. Sleep hygiene measures should be discussed with the patient  5. Behavioral therapy such as weight reduction or smoking cessation as appropriate for the patient  6. Advise patient against the use of alcohol or sedatives in so much as these substances can worsen excessive daytime sleepiness and respiratory disturbances of sleep  7. Advise patient against participating in potentially dangerous activities while drowsy such as operating a motor vehicle, heavy equipment or power  tools as it can put them and others in danger  8. Advise patient of the long term consequences of OSA if left untreated, need for treatment and close follow up  9. Clinical follow up as deemed necessary     This Level III home sleep study was performed using the [employer], a 4 channel screening device subject to limitations. Depending on actual total sleep time, not measured in this study, the AHI (sum of apneas and hypopneas/hr of sleep) and therefore the severity of sleep apnea may be underestimated. As with any single night study, including Level 1 attended PSG, severity of sleep apnea may also be underestimated due to the lack of supine and/or REM sleep.  The interpretation associated with this report is based on normal values and degrees of severity in accordance with AASM parameters and/or estimated from multiple sources in the literature for adults ages 29-80+. These may not agree with the displayed values. The patient's treating physician should use the interpretation and recommendations in conjunction with the overall clinical evaluation and treatment of the patient.  Some of the terminology used in this scored ApneaLink report was developed several years ago and may not always be in accordance with current nomenclature. This in no way affects the accuracy of the data or the reliability of the interpretation and recommendations.

## 2018-07-18 ENCOUNTER — Ambulatory Visit: Payer: BLUE CROSS/BLUE SHIELD | Admitting: Internal Medicine

## 2018-07-18 ENCOUNTER — Encounter: Payer: Self-pay | Admitting: Internal Medicine

## 2018-07-18 VITALS — BP 120/80 | HR 84 | Resp 16 | Ht 68.0 in | Wt 203.0 lb

## 2018-07-18 DIAGNOSIS — G4733 Obstructive sleep apnea (adult) (pediatric): Secondary | ICD-10-CM | POA: Diagnosis not present

## 2018-07-18 DIAGNOSIS — I420 Dilated cardiomyopathy: Secondary | ICD-10-CM

## 2018-07-18 DIAGNOSIS — G4710 Hypersomnia, unspecified: Secondary | ICD-10-CM | POA: Diagnosis not present

## 2018-07-18 DIAGNOSIS — J449 Chronic obstructive pulmonary disease, unspecified: Secondary | ICD-10-CM

## 2018-07-18 NOTE — Patient Instructions (Signed)

## 2018-07-18 NOTE — Progress Notes (Signed)
[facility] [place], [place] [address]  Pulmonary Sleep Medicine   Office Visit Note  Patient Name: Joe Berry DOB: [birth year] MRN [id number]  Date of Service: 07/18/2018  Complaints/HPI:  Pt here for follow up on home sleep study.  His study shows an AHI score of 8.2 consistent with mild obstructive sleep apnea.  He has 12 obstructive apneas and 43 hypopneas. He also displayed a lowest o2 saturation of 60%.   Based on this patient should qualify for CPAP titration study.  The biggest concern would be the severe oxygen desaturation that he was noted to have on the baseline sleep study.  Likely report represents an overlap syndrome  ROS  General: (-) fever, (-) chills, (-) night sweats, (-) weakness Skin: (-) rashes, (-) itching,. Eyes: (-) visual changes, (-) redness, (-) itching. Nose and Sinuses: (-) nasal stuffiness or itchiness, (-) postnasal drip, (-) nosebleeds, (-) sinus trouble. Mouth and Throat: (-) sore throat, (-) hoarseness. Neck: (-) swollen glands, (-) enlarged thyroid, (-) neck pain. Respiratory: - cough, (-) bloody sputum, - shortness of breath, - wheezing. Cardiovascular: - ankle swelling, (-) chest pain. Lymphatic: (-) lymph node enlargement. Neurologic: (-) numbness, (-) tingling. Psychiatric: (-) anxiety, (-) depression   Current Medication: Outpatient Encounter Medications as of 07/18/2018  Medication Sig  . ACCU-CHEK FASTCLIX LANCETS MISC Use to test blood sugars twice daily e11.65  . aspirin EC 81 MG tablet Take 1 tablet (81 mg total) by mouth daily.  . Blood Glucose Monitoring Suppl (ONETOUCH VERIO) w/Device KIT Use kit to test blood sugars . Dx e11.65  . dapagliflozin propanediol (FARXIGA) 10 MG TABS tablet Take 10 mg by mouth daily.  . diphenhydrAMINE HCl (ZZZQUIL) 50 MG/30ML LIQD Take 30 mLs by mouth at bedtime as needed (sleep).  . furosemide (LASIX) 40 MG tablet Take 1 tablet (40 mg total) by mouth daily.  Marland Kitchen glucose blood  (ONETOUCH VERIO) test strip Use as instructed , test blood sugars twice a day. e11.65  . metFORMIN (GLUCOPHAGE) 500 MG tablet Take 1 tablet (500 mg total) by mouth 2 (two) times daily with a meal.  . metoprolol succinate (TOPROL-XL) 50 MG 24 hr tablet Take 1 tablet (50 mg total) by mouth daily. Take with or immediately following a meal.  . potassium chloride (K-DUR) 10 MEQ tablet Take 1 tablet (10 mEq total) by mouth daily. (Patient taking differently: Take 20 mEq by mouth daily. )  . sacubitril-valsartan (ENTRESTO) 24-26 MG Take 1 tablet by mouth 2 (two) times daily.  Marland Kitchen spironolactone (ALDACTONE) 25 MG tablet Take 1 tablet (25 mg total) by mouth daily.   No facility-administered encounter medications on file as of 07/18/2018.     Surgical History: Past Surgical History:  Procedure Laterality Date  . LEFT HEART CATH AND CORONARY ANGIOGRAPHY Left 04/12/2018   Procedure: LEFT HEART CATH AND CORONARY ANGIOGRAPHY;  Surgeon: Minna Merritts, MD;  Location: [facility];  Service: Cardiovascular;  Laterality: Left;    Medical History: Past Medical History:  Diagnosis Date  . Anxiety   . Cataract   . CHF (congestive heart failure) ([place])   . COPD (chronic obstructive pulmonary disease) (HCC)     Family History: Family History  Problem Relation Age of Onset  . Heart disease Mother   . Lung cancer Father   . Heart disease Maternal Grandmother   . Heart Problems Maternal Grandmother   . Heart Problems Brother   . Heart Problems Maternal Grandfather  Social History: Social History   Socioeconomic History  . Marital status: Single    Spouse name: Not on file  . Number of children: Not on file  . Years of education: Not on file  . Highest education level: Not on file  Occupational History  . Not on file  Social Needs  . Financial resource strain: Not on file  . Food insecurity:    Worry: Not on file    Inability: Not on file  . Transportation needs:    Medical: Not  on file    Non-medical: Not on file  Tobacco Use  . Smoking status: Former Smoker    Packs/day: 1.50    Years: 40.00    Pack years: 60.00    Types: Cigarettes  . Smokeless tobacco: Never Used  . Tobacco comment: quit 2008  Substance and Sexual Activity  . Alcohol use: Not on file    Comment: occasional drink  . Drug use: Never  . Sexual activity: Not on file  Lifestyle  . Physical activity:    Days per week: Not on file    Minutes per session: Not on file  . Stress: Not on file  Relationships  . Social connections:    Talks on phone: Not on file    Gets together: Not on file    Attends religious service: Not on file    Active member of club or organization: Not on file    Attends meetings of clubs or organizations: Not on file    Relationship status: Not on file  . Intimate partner violence:    Fear of current or ex partner: Not on file    Emotionally abused: Not on file    Physically abused: Not on file    Forced sexual activity: Not on file  Other Topics Concern  . Not on file  Social History Narrative  . Not on file    Vital Signs: Blood pressure 120/80, pulse 84, resp. rate 16, height '5\' 8"'  (1.727 m), weight 203 lb (92.1 kg), SpO2 98 %.  Examination: General Appearance: The patient is well-developed, well-nourished, and in no distress. Skin: Gross inspection of skin unremarkable. Head: normocephalic, no gross deformities. Eyes: no gross deformities noted. ENT: ears appear grossly normal no exudates. Neck: Supple. No thyromegaly. No LAD. Respiratory: Clear to auscultation bilateraly. Cardiovascular: Normal S1 and S2 without murmur or rub. Extremities: No cyanosis. pulses are equal. Neurologic: Alert and oriented. No involuntary movements.  LABS: Recent Results (from the past 2160 hour(s))  PSA     Status: None   Collection Time: 06/13/18  9:00 AM  Result Value Ref Range   Prostatic Specific Antigen 0.68 0.00 - 4.00 ng/mL    Comment: (NOTE) While PSA  levels of <=4.0 ng/ml are reported as reference range, some men with levels below 4.0 ng/ml can have prostate cancer and many men with PSA above 4.0 ng/ml do not have prostate cancer.  Other tests such as free PSA, age specific reference ranges, PSA velocity and PSA doubling time may be helpful especially in men less than 18 years old. Performed at [facility], [place] [address], [place], [place] [address]   Basic metabolic panel     Status: Abnormal   Collection Time: 06/13/18  9:00 AM  Result Value Ref Range   Sodium 136 135 - 145 mmol/L   Potassium 4.4 3.5 - 5.1 mmol/L   Chloride 97 (L) 98 - 111 mmol/L   CO2 28 22 - 32  mmol/L   Glucose, Bld 375 (H) 70 - 99 mg/dL   BUN 17 8 - 23 mg/dL   Creatinine, Ser 0.73 0.61 - 1.24 mg/dL   Calcium 8.9 8.9 - 10.3 mg/dL   GFR calc non Af Amer >60 >60 mL/min   GFR calc Af Amer >60 >60 mL/min    Comment: (NOTE) The eGFR has been calculated using the CKD EPI equation. This calculation has not been validated in all clinical situations. eGFR's persistently <60 mL/min signify possible Chronic Kidney Disease.    Anion gap 11 5 - 15    Comment: Performed at [facility], [address] [place], [place] [address]  Lipid panel     Status: Abnormal   Collection Time: 06/13/18  9:00 AM  Result Value Ref Range   Cholesterol 204 (H) 0 - 200 mg/dL   Triglycerides 158 (H) <150 mg/dL   HDL 37 (L) >40 mg/dL   Total CHOL/HDL Ratio 5.5 RATIO   VLDL 32 0 - 40 mg/dL   LDL Cholesterol 135 (H) 0 - 99 mg/dL    Comment:        Total Cholesterol/HDL:CHD Risk Coronary Heart Disease Risk Table                     Men   Women  1/2 Average Risk   3.4   3.3  Average Risk       5.0   4.4  2 X Average Risk   9.6   7.1  3 X Average Risk  23.4   11.0        Use the calculated Patient Ratio above and the CHD Risk Table to determine the patient's CHD Risk.        ATP III CLASSIFICATION (LDL):  <100     mg/dL   Optimal  100-129  mg/dL   Near or  Above                    Optimal  130-159  mg/dL   Borderline  160-189  mg/dL   High  >190     mg/dL   Very High Performed at [facility], [address] [place], [place] [address]   Hemoglobin A1c     Status: Abnormal   Collection Time: 06/13/18  9:00 AM  Result Value Ref Range   Hgb A1c MFr Bld 13.9 (H) 4.8 - 5.6 %    Comment: (NOTE) Pre diabetes:          5.7%-6.4% Diabetes:              >6.4% Glycemic control for   <7.0% adults with diabetes    Mean Plasma Glucose 352.23 mg/dL    Comment: Performed at [facility] [address] [place], [place] [address]    Radiology: No results found.  No results found.  No results found.    Assessment and Plan: Patient Active Problem List   Diagnosis Date Noted  . Pulmonary HTN ([place]) 05/04/2018  . Unstable angina ([place]) 04/04/2018  . Dilated cardiomyopathy ([place]) 04/03/2018  . Coronary artery calcification seen on CAT scan 04/03/2018  . Empyema lung ([place]) 03/12/2018  . Pleural effusion 03/12/2018  . Pleural plaque without asbestos 03/12/2018  . SOB (shortness of breath) 03/12/2018  . Lung mass 03/12/2018    1. OSA (obstructive sleep apnea) Home sleep study indicates mild OSA.  Obtain CPAP titration study, for delivery of CPAP.  - Cpap titration; Future  2. Chronic obstructive pulmonary disease, unspecified COPD type ([place]) Stable.  Pt does not take any inhalers currently.  Denies DOE.  3. Hypersomnia Continues to have episodes of hypersomnia, will likely be resolved with CPAP.  4. Dilated cardiomyopathy ([place]) Continue to see Dr. Rockey Situ for cardiology.  General Counseling: I have discussed the findings of the evaluation and examination with Louie Casa.  I have also discussed any further diagnostic evaluation thatmay be needed or ordered today. Ott verbalizes understanding of the findings of todays visit. We also reviewed his medications today and discussed drug interactions and side effects including but  not limited excessive drowsiness and altered mental states. We also discussed that there is always a risk not just to him but also people around him. he has been encouraged to call the office with any questions or concerns that should arise related to todays visit.    Time spent: 25 This patient was seen by Orson Gear AGNP-C in Collaboration with Dr. Devona Konig as a part of collaborative care agreement.   I have personally obtained a history, examined the patient, evaluated laboratory and imaging results, formulated the assessment and plan and placed orders.    Allyne Gee, MD [facility] Pulmonary and Critical Care Sleep medicine

## 2018-07-26 ENCOUNTER — Other Ambulatory Visit: Payer: Self-pay | Admitting: Adult Health

## 2018-07-26 ENCOUNTER — Telehealth: Payer: Self-pay | Admitting: Adult Health

## 2018-07-26 MED ORDER — ZOLPIDEM TARTRATE 10 MG PO TABS: 10.0000 mg | ORAL_TABLET | Freq: Every evening | ORAL | 0 refills | Status: DC | PRN

## 2018-07-29 NOTE — Telephone Encounter (Signed)
Patient has been advised his prescription is at pharmacy and not to take until his appt and also do not combine with any other sleeping aids. Beth

## 2018-07-29 NOTE — Telephone Encounter (Signed)
-----   Message from Johnna Acosta, NP sent at 07/26/2018  3:55 PM EDT ----- I sent two Ambien for him to sleep for his sleep study.  He can take ONE when he gets to the sleep study and is all ready.  They take affect fast so DO NOT take it ahead of time.  I sent two in case he drops one, or if he wanted to try it out before the night of the study. Thank you

## 2018-07-30 ENCOUNTER — Encounter (INDEPENDENT_AMBULATORY_CARE_PROVIDER_SITE_OTHER): Payer: BLUE CROSS/BLUE SHIELD | Admitting: Internal Medicine

## 2018-07-30 DIAGNOSIS — G4733 Obstructive sleep apnea (adult) (pediatric): Secondary | ICD-10-CM

## 2018-08-05 NOTE — Procedures (Signed)
[facility] [address] [place], [place] [address]  Patient Name: Joe Berry DOB: September 03, 1956   SLEEP STUDY INTERPRETATION  DATE OF SERVICE: July 30, 2018   SLEEP STUDY HISTORY: This patient is referred to the sleep lab for a baseline Polysomnography. Pertinent history includes a history of diagnosis of excessive daytime somnolence and snoring.  PROCEDURE: This overnight polysomnogram was performed using the Alice 5 acquisition system using the standard diagnostic protocol as outlined by the AASM. This includes 6 channels of EEG, 2 channelscannels of EOG, chin EMG, bilateral anterior tibialis EMG, nasal/oral thermister, PTAF, chest and abdominal wall movements, ECG and pulse oximetry. Apneas and Hypopneas were scored per AASM definition.  SLEEP ARCHITECHTURE: This is a baseline polysomnograph  study. The total recording time was 369.2 minutes and the patients total sleep time is noted to be 161 minutes. Sleep onset latency was 154.5 minutes and is prolonged.  Stage R sleep onset latency was 70.5 minutes. Sleep maintenance efficiency was 44.1 % and is decreased.  Sleep staging expressed as a percentage of total sleep time demonstrated 14.3 % N1, 50.9 % N2 and 4.3 % N3  sleep. Stage R represents 30.4 % of total sleep time. This is increased.  There were a total of 15 arousals  for an overall arousal index of 5.6 per hour of sleep. PLMS arousal were not noted. Arousals without respiratory events are  noted. This can contribute to sleep architechture disruption.  RESPIRATORY MONITORING:   Patient exhibits some evidence of sleep disorderd breathing characterized by 0 central apneas, 5 obstructive apneas and 1 mixed apneas. There were 10 obstructive hypopneas and 0 RERAs. Most of the apneas/hypopneas were of obstructive variety. The total apnea hypopnea index (apneas and hypopneas per hour of sleep) is 6.0 respiratory events per hour and is mildly increased.  Respiratory  monitoring demonstrated no snoring through the night. There are a total of 0 snoring episodes representing 0 % of sleep.   Baseline oxygen saturation during wakefulness was 92 % and during NREM sleep averaged 91 % through the night. Arterial saturation during REM sleep was 89 % through the night. There was significant  oxygen desaturation with the respiratory events. Arterial oxygen desaturation occurred of at least 4% was noted with a low saturation of 80 %. The study was performed off oxygen.  CARDIAC MONITORING:   Average heart rate is 75 during sleep with a high of 87 beats per minute. Malignant arrhythmias were not noted.  CPAP titration: Patient had difficulty tolerating CPAP.  Patient was actually started on a CPAP of 5 and during this time patient had 161 minutes of sleep with an apnea hypotony index of 6.  The lowest saturation went down to was 80%.  Patient was not able to tolerate after he went into REM sleep.  Appears that the CPAP was able to control respiratory events and breathing to some extent.   IMPRESSIONS:  --This overnight polysomnogram demonstrates presence of mild sleep apnea with an overall AHI 6.0 per hour. --The overall AHI was somewhat worse  during Stage non-REM. --There were associated significant arterial oxygen desaturations noted down to 80% --There was no significant PLMS noted in this study. --There is no snoring noted throughout the study.    RECOMMENDATIONS:  --CPAP titration showed initial good response to therapy however patient had difficulty tolerating the CPAP beyond 5 and it appears that the titration was not done beyond the level 5.  It would be therefore recommended to try the patient  on auto titrating device to see if patient does better clinical correlation is recommended. --Nasal decongestants and antihistamines may be of help for increased upper airways resistance when present. --Weight loss through dietary and lifestyle modification is  recommended in the presence of obesity. --A search for and treatment of any underlying cardiopulmonary disease is      recommended in the presence of oxygen desaturations. --Alternative treatment options if the patient is not willing to use CPAP include oral   appliances as well as surgical intervention which may help in the appropriate patient. --Clinical correlation is recommended. Please feel free to call the office for any further  questions or assistance in the care of this patient.     Yevonne Pax, MD [facility] Pulmonary Critical Care Medicine Sleep medicine

## 2018-08-08 ENCOUNTER — Encounter: Payer: Self-pay | Admitting: Internal Medicine

## 2018-08-08 ENCOUNTER — Ambulatory Visit: Payer: BLUE CROSS/BLUE SHIELD | Admitting: Internal Medicine

## 2018-08-08 VITALS — BP 120/71 | HR 75 | Resp 16 | Ht 68.0 in | Wt 203.0 lb

## 2018-08-08 DIAGNOSIS — G4710 Hypersomnia, unspecified: Secondary | ICD-10-CM | POA: Diagnosis not present

## 2018-08-08 DIAGNOSIS — Z23 Encounter for immunization: Secondary | ICD-10-CM | POA: Diagnosis not present

## 2018-08-08 DIAGNOSIS — G4733 Obstructive sleep apnea (adult) (pediatric): Secondary | ICD-10-CM | POA: Diagnosis not present

## 2018-08-08 DIAGNOSIS — J449 Chronic obstructive pulmonary disease, unspecified: Secondary | ICD-10-CM | POA: Diagnosis not present

## 2018-08-08 NOTE — Patient Instructions (Signed)

## 2018-08-08 NOTE — Progress Notes (Signed)
[facility] [place], [place] [address]  Pulmonary Sleep Medicine   Office Visit Note  Patient Name: Joe Berry DOB: 05/21/1956 MRN [id number]  Date of Service: 08/08/2018  Complaints/HPI:  Pt here for follow up for OSA after titration study.  His titration study revealed a pressure of 5 was the most he could tolerate. He continues to reports EDS symptoms, and snoring.  He denies coughing, chest pain or sinus issues at this time. His saturation did decrease to 80% during titration, so an overnight oximetry will need to be performed, once his cpap is delivered.      ROS  General: (-) fever, (-) chills, (-) night sweats, (-) weakness Skin: (-) rashes, (-) itching,. Eyes: (-) visual changes, (-) redness, (-) itching. Nose and Sinuses: (-) nasal stuffiness or itchiness, (-) postnasal drip, (-) nosebleeds, (-) sinus trouble. Mouth and Throat: (-) sore throat, (-) hoarseness. Neck: (-) swollen glands, (-) enlarged thyroid, (-) neck pain. Respiratory: - cough, (-) bloody sputum, - shortness of breath, - wheezing. Cardiovascular: - ankle swelling, (-) chest pain. Lymphatic: (-) lymph node enlargement. Neurologic: (-) numbness, (-) tingling. Psychiatric: (-) anxiety, (-) depression   Current Medication: Outpatient Encounter Medications as of 08/08/2018  Medication Sig  . ACCU-CHEK FASTCLIX LANCETS MISC Use to test blood sugars twice daily e11.65  . aspirin EC 81 MG tablet Take 1 tablet (81 mg total) by mouth daily.  . Blood Glucose Monitoring Suppl (ONETOUCH VERIO) w/Device KIT Use kit to test blood sugars . Dx e11.65  . dapagliflozin propanediol (FARXIGA) 10 MG TABS tablet Take 10 mg by mouth daily.  . diphenhydrAMINE HCl (ZZZQUIL) 50 MG/30ML LIQD Take 30 mLs by mouth at bedtime as needed (sleep).  . furosemide (LASIX) 40 MG tablet Take 1 tablet (40 mg total) by mouth daily.  Marland Kitchen glucose blood (ONETOUCH VERIO) test strip Use as instructed , test blood sugars  twice a day. e11.65  . metFORMIN (GLUCOPHAGE) 500 MG tablet Take 1 tablet (500 mg total) by mouth 2 (two) times daily with a meal.  . metoprolol succinate (TOPROL-XL) 50 MG 24 hr tablet Take 1 tablet (50 mg total) by mouth daily. Take with or immediately following a meal.  . potassium chloride (K-DUR) 10 MEQ tablet Take 1 tablet (10 mEq total) by mouth daily. (Patient taking differently: Take 20 mEq by mouth daily. )  . sacubitril-valsartan (ENTRESTO) 24-26 MG Take 1 tablet by mouth 2 (two) times daily.  Marland Kitchen spironolactone (ALDACTONE) 25 MG tablet Take 1 tablet (25 mg total) by mouth daily.  Marland Kitchen zolpidem (AMBIEN) 10 MG tablet Take 1 tablet (10 mg total) by mouth at bedtime as needed for up to 2 days for sleep (For sleep study).   No facility-administered encounter medications on file as of 08/08/2018.     Surgical History: Past Surgical History:  Procedure Laterality Date  . LEFT HEART CATH AND CORONARY ANGIOGRAPHY Left 04/12/2018   Procedure: LEFT HEART CATH AND CORONARY ANGIOGRAPHY;  Surgeon: Minna Merritts, MD;  Location: [facility];  Service: Cardiovascular;  Laterality: Left;    Medical History: Past Medical History:  Diagnosis Date  . Anxiety   . Cataract   . CHF (congestive heart failure) ([place])   . COPD (chronic obstructive pulmonary disease) ([place])   . Sleep apnea     Family History: Family History  Problem Relation Age of Onset  . Heart disease Mother   . Lung cancer Father   . Heart disease Maternal Grandmother   .  Heart Problems Maternal Grandmother   . Heart Problems Brother   . Heart Problems Maternal Grandfather     Social History: Social History   Socioeconomic History  . Marital status: Single    Spouse name: Not on file  . Number of children: Not on file  . Years of education: Not on file  . Highest education level: Not on file  Occupational History  . Not on file  Social Needs  . Financial resource strain: Not on file  . Food insecurity:     Worry: Not on file    Inability: Not on file  . Transportation needs:    Medical: Not on file    Non-medical: Not on file  Tobacco Use  . Smoking status: Former Smoker    Packs/day: 1.50    Years: 40.00    Pack years: 60.00    Types: Cigarettes  . Smokeless tobacco: Never Used  . Tobacco comment: quit 2008  Substance and Sexual Activity  . Alcohol use: Not on file    Comment: occasional drink  . Drug use: Never  . Sexual activity: Not on file  Lifestyle  . Physical activity:    Days per week: Not on file    Minutes per session: Not on file  . Stress: Not on file  Relationships  . Social connections:    Talks on phone: Not on file    Gets together: Not on file    Attends religious service: Not on file    Active member of club or organization: Not on file    Attends meetings of clubs or organizations: Not on file    Relationship status: Not on file  . Intimate partner violence:    Fear of current or ex partner: Not on file    Emotionally abused: Not on file    Physically abused: Not on file    Forced sexual activity: Not on file  Other Topics Concern  . Not on file  Social History Narrative  . Not on file    Vital Signs: Blood pressure 120/71, pulse 75, resp. rate 16, height '5\' 8"'  (1.727 m), weight 203 lb (92.1 kg), SpO2 97 %.  Examination: General Appearance: The patient is well-developed, well-nourished, and in no distress. Skin: Gross inspection of skin unremarkable. Head: normocephalic, no gross deformities. Eyes: no gross deformities noted. ENT: ears appear grossly normal no exudates. Neck: Supple. No thyromegaly. No LAD. Respiratory: clear to auscultation. Cardiovascular: Normal S1 and S2 without murmur or rub. Extremities: No cyanosis. pulses are equal. Neurologic: Alert and oriented. No involuntary movements.  LABS: Recent Results (from the past 2160 hour(s))  PSA     Status: None   Collection Time: 06/13/18  9:00 AM  Result Value Ref Range    Prostatic Specific Antigen 0.68 0.00 - 4.00 ng/mL    Comment: (NOTE) While PSA levels of <=4.0 ng/ml are reported as reference range, some men with levels below 4.0 ng/ml can have prostate cancer and many men with PSA above 4.0 ng/ml do not have prostate cancer.  Other tests such as free PSA, age specific reference ranges, PSA velocity and PSA doubling time may be helpful especially in men less than 10 years old. Performed at [facility], [place] [address] [place], [place] [address]   Basic metabolic panel     Status: Abnormal   Collection Time: 06/13/18  9:00 AM  Result Value Ref Range   Sodium 136 135 - 145 mmol/L   Potassium 4.4  3.5 - 5.1 mmol/L   Chloride 97 (L) 98 - 111 mmol/L   CO2 28 22 - 32 mmol/L   Glucose, Bld 375 (H) 70 - 99 mg/dL   BUN 17 8 - 23 mg/dL   Creatinine, Ser 0.73 0.61 - 1.24 mg/dL   Calcium 8.9 8.9 - 10.3 mg/dL   GFR calc non Af Amer >60 >60 mL/min   GFR calc Af Amer >60 >60 mL/min    Comment: (NOTE) The eGFR has been calculated using the CKD EPI equation. This calculation has not been validated in all clinical situations. eGFR's persistently <60 mL/min signify possible Chronic Kidney Disease.    Anion gap 11 5 - 15    Comment: Performed at [facility], [address] [place], [place] [address]  Lipid panel     Status: Abnormal   Collection Time: 06/13/18  9:00 AM  Result Value Ref Range   Cholesterol 204 (H) 0 - 200 mg/dL   Triglycerides 158 (H) <150 mg/dL   HDL 37 (L) >40 mg/dL   Total CHOL/HDL Ratio 5.5 RATIO   VLDL 32 0 - 40 mg/dL   LDL Cholesterol 135 (H) 0 - 99 mg/dL    Comment:        Total Cholesterol/HDL:CHD Risk Coronary Heart Disease Risk Table                     Men   Women  1/2 Average Risk   3.4   3.3  Average Risk       5.0   4.4  2 X Average Risk   9.6   7.1  3 X Average Risk  23.4   11.0        Use the calculated Patient Ratio above and the CHD Risk Table to determine the patient's CHD Risk.        ATP III  CLASSIFICATION (LDL):  <100     mg/dL   Optimal  100-129  mg/dL   Near or Above                    Optimal  130-159  mg/dL   Borderline  160-189  mg/dL   High  >190     mg/dL   Very High Performed at [facility], [address] [place], [place] [address]   Hemoglobin A1c     Status: Abnormal   Collection Time: 06/13/18  9:00 AM  Result Value Ref Range   Hgb A1c MFr Bld 13.9 (H) 4.8 - 5.6 %    Comment: (NOTE) Pre diabetes:          5.7%-6.4% Diabetes:              >6.4% Glycemic control for   <7.0% adults with diabetes    Mean Plasma Glucose 352.23 mg/dL    Comment: Performed at [facility] [address] [place],  [address]    Radiology: No results found.  No results found.  No results found.    Assessment and Plan: Patient Active Problem List   Diagnosis Date Noted  . Pulmonary HTN ([place]) 05/04/2018  . Unstable angina ([place]) 04/04/2018  . Dilated cardiomyopathy ([place]) 04/03/2018  . Coronary artery calcification seen on CAT scan 04/03/2018  . Empyema lung ([place]) 03/12/2018  . Pleural effusion 03/12/2018  . Pleural plaque without asbestos 03/12/2018  . SOB (shortness of breath) 03/12/2018  . Lung mass 03/12/2018    1. OSA (obstructive sleep apnea)  cpap ordered.  Will follow up in 3 months post delivery.  - For home use only DME continuous positive airway pressure (CPAP) - Pulse oximetry, overnight; Future  2. Hypersomnia Continues at this time. Instructed patient not to drive, while feeling this way.  Discussed safety.   3. Chronic obstructive pulmonary disease, unspecified COPD type ([place]) Remains stable.  Not on inhalers, and no Doe.   4. Flu vaccine need - Flu Vaccine MDCK QUAD PF  General Counseling: I have discussed the findings of the evaluation and examination with Louie Casa.  I have also discussed any further diagnostic evaluation thatmay be needed or ordered today. Vondell verbalizes understanding of the findings of todays visit. We  also reviewed his medications today and discussed drug interactions and side effects including but not limited excessive drowsiness and altered mental states. We also discussed that there is always a risk not just to him but also people around him. he has been encouraged to call the office with any questions or concerns that should arise related to todays visit.    Time spent: 25 This patient was seen by Orson Gear AGNP-C in Collaboration with Dr. Devona Konig as a part of collaborative care agreement.   I have personally obtained a history, examined the patient, evaluated laboratory and imaging results, formulated the assessment and plan and placed orders.    Allyne Gee, MD [facility] Pulmonary and Critical Care Sleep medicine

## 2018-08-14 ENCOUNTER — Other Ambulatory Visit: Payer: Self-pay

## 2018-08-14 ENCOUNTER — Ambulatory Visit (INDEPENDENT_AMBULATORY_CARE_PROVIDER_SITE_OTHER): Payer: BLUE CROSS/BLUE SHIELD

## 2018-08-14 DIAGNOSIS — I2510 Atherosclerotic heart disease of native coronary artery without angina pectoris: Secondary | ICD-10-CM

## 2018-08-14 DIAGNOSIS — I420 Dilated cardiomyopathy: Secondary | ICD-10-CM | POA: Diagnosis not present

## 2018-08-14 DIAGNOSIS — I272 Pulmonary hypertension, unspecified: Secondary | ICD-10-CM

## 2018-08-14 DIAGNOSIS — R0602 Shortness of breath: Secondary | ICD-10-CM

## 2018-08-21 ENCOUNTER — Ambulatory Visit (INDEPENDENT_AMBULATORY_CARE_PROVIDER_SITE_OTHER): Payer: BLUE CROSS/BLUE SHIELD

## 2018-08-21 DIAGNOSIS — G4733 Obstructive sleep apnea (adult) (pediatric): Secondary | ICD-10-CM

## 2018-08-21 NOTE — Progress Notes (Signed)
New cpap setup  Joe Berry was setup on resmed auto cpap 5-15 cwp with a resmed F-20 full face large. We went over using, cleaning, and compliance of cpap. He will follow up in cpap clinic in 4 weeks

## 2018-08-28 ENCOUNTER — Telehealth: Payer: Self-pay

## 2018-08-28 NOTE — Telephone Encounter (Signed)
Patient has been advised that his Overnight Oximetry does not qualify him for nocturnal oxygen. Adam reviewed results and put in scan.Beth

## 2018-08-30 ENCOUNTER — Encounter: Payer: Self-pay | Admitting: Internal Medicine

## 2018-08-30 NOTE — Progress Notes (Signed)
Scanned in overnight pulse ox started on 08/21/18.

## 2018-09-11 ENCOUNTER — Ambulatory Visit: Payer: BLUE CROSS/BLUE SHIELD

## 2018-09-11 ENCOUNTER — Telehealth: Payer: Self-pay

## 2018-09-11 ENCOUNTER — Other Ambulatory Visit: Payer: Self-pay

## 2018-09-11 DIAGNOSIS — G4733 Obstructive sleep apnea (adult) (pediatric): Secondary | ICD-10-CM | POA: Diagnosis not present

## 2018-09-11 MED ORDER — METFORMIN HCL 500 MG PO TABS: 500.0000 mg | ORAL_TABLET | Freq: Three times a day (TID) | ORAL | 1 refills | Status: DC

## 2018-09-11 NOTE — Telephone Encounter (Signed)
Patient did get his overnight oximetry results and righth now he feels fine and doesn't feel the need for oxygen at night unless its being forced, advised pt if he starts feeling any symptoms to let us know and and we can re test him again for nocturnal oxygen but right now he doesn't want to go forward with O2.Beth

## 2018-09-11 NOTE — Telephone Encounter (Signed)
As per adam change metformin 500 three times a daily and [facility]

## 2018-09-11 NOTE — Progress Notes (Signed)
95 percentile pressure 12.6   95th percentile leak 18.2   apnea index 1.9 /hr  apnea-hypopnea index  3.4 /hr   total days used  >4 hr 20 days  total days used <4 hr 0 days  Total compliance 100 percent

## 2018-09-18 ENCOUNTER — Encounter: Payer: Self-pay | Admitting: Adult Health

## 2018-09-18 ENCOUNTER — Other Ambulatory Visit: Payer: Self-pay | Admitting: Adult Health

## 2018-09-18 ENCOUNTER — Ambulatory Visit: Payer: BLUE CROSS/BLUE SHIELD | Admitting: Adult Health

## 2018-09-18 VITALS — BP 118/78 | HR 87 | Resp 16 | Ht 68.0 in | Wt 206.0 lb

## 2018-09-18 DIAGNOSIS — G4733 Obstructive sleep apnea (adult) (pediatric): Secondary | ICD-10-CM | POA: Diagnosis not present

## 2018-09-18 DIAGNOSIS — R300 Dysuria: Secondary | ICD-10-CM | POA: Diagnosis not present

## 2018-09-18 DIAGNOSIS — Z6831 Body mass index (BMI) 31.0-31.9, adult: Secondary | ICD-10-CM

## 2018-09-18 DIAGNOSIS — I420 Dilated cardiomyopathy: Secondary | ICD-10-CM

## 2018-09-18 DIAGNOSIS — Z0001 Encounter for general adult medical examination with abnormal findings: Secondary | ICD-10-CM

## 2018-09-18 DIAGNOSIS — E1165 Type 2 diabetes mellitus with hyperglycemia: Secondary | ICD-10-CM

## 2018-09-18 DIAGNOSIS — E6609 Other obesity due to excess calories: Secondary | ICD-10-CM

## 2018-09-18 LAB — POCT GLYCOSYLATED HEMOGLOBIN (HGB A1C): Hemoglobin A1C: 9.4 % — AB (ref 4.0–5.6)

## 2018-09-18 MED ORDER — DULAGLUTIDE 1.5 MG/0.5ML ~~LOC~~ SOAJ: 1.5000 mg | SUBCUTANEOUS | 4 refills | Status: DC

## 2018-09-18 MED ORDER — DULAGLUTIDE 1.5 MG/0.5ML ~~LOC~~ SOAJ: SUBCUTANEOUS | 1 refills | Status: DC

## 2018-09-18 MED ORDER — DULAGLUTIDE 1.5 MG/0.5ML ~~LOC~~ SOAJ: SUBCUTANEOUS | 4 refills | Status: DC

## 2018-09-18 NOTE — Patient Instructions (Signed)
Diabetes Mellitus and Nutrition When you have diabetes (diabetes mellitus), it is very important to have healthy eating habits because your blood sugar (glucose) levels are greatly affected by what you eat and drink. Eating healthy foods in the appropriate amounts, at about the same times every day, can help you:  Control your blood glucose.  Lower your risk of heart disease.  Improve your blood pressure.  Reach or maintain a healthy weight.  Every person with diabetes is different, and each person has different needs for a meal plan. Your health care provider may recommend that you work with a diet and nutrition specialist (dietitian) to make a meal plan that is best for you. Your meal plan may vary depending on factors such as:  The calories you need.  The medicines you take.  Your weight.  Your blood glucose, blood pressure, and cholesterol levels.  Your activity level.  Other health conditions you have, such as heart or kidney disease.  How do carbohydrates affect me? Carbohydrates affect your blood glucose level more than any other type of food. Eating carbohydrates naturally increases the amount of glucose in your blood. Carbohydrate counting is a method for keeping track of how many carbohydrates you eat. Counting carbohydrates is important to keep your blood glucose at a healthy level, especially if you use insulin or take certain oral diabetes medicines. It is important to know how many carbohydrates you can safely have in each meal. This is different for every person. Your dietitian can help you calculate how many carbohydrates you should have at each meal and for snack. Foods that contain carbohydrates include:  Bread, cereal, rice, pasta, and crackers.  Potatoes and corn.  Peas, beans, and lentils.  Milk and yogurt.  Fruit and juice.  Desserts, such as cakes, cookies, ice cream, and candy.  How does alcohol affect me? Alcohol can cause a sudden decrease in blood  glucose (hypoglycemia), especially if you use insulin or take certain oral diabetes medicines. Hypoglycemia can be a life-threatening condition. Symptoms of hypoglycemia (sleepiness, dizziness, and confusion) are similar to symptoms of having too much alcohol. If your health care provider says that alcohol is safe for you, follow these guidelines:  Limit alcohol intake to no more than 1 drink per day for nonpregnant women and 2 drinks per day for men. One drink equals 12 oz of beer, 5 oz of wine, or 1 oz of hard liquor.  Do not drink on an empty stomach.  Keep yourself hydrated with water, diet soda, or unsweetened iced tea.  Keep in mind that regular soda, juice, and other mixers may contain a lot of sugar and must be counted as carbohydrates.  What are tips for following this plan? Reading food labels  Start by checking the serving size on the label. The amount of calories, carbohydrates, fats, and other nutrients listed on the label are based on one serving of the food. Many foods contain more than one serving per package.  Check the total grams (g) of carbohydrates in one serving. You can calculate the number of servings of carbohydrates in one serving by dividing the total carbohydrates by 15. For example, if a food has 30 g of total carbohydrates, it would be equal to 2 servings of carbohydrates.  Check the number of grams (g) of saturated and trans fats in one serving. Choose foods that have low or no amount of these fats.  Check the number of milligrams (mg) of sodium in one serving. Most people   should limit total sodium intake to less than 2,300 mg per day.  Always check the nutrition information of foods labeled as "low-fat" or "nonfat". These foods may be higher in added sugar or refined carbohydrates and should be avoided.  Talk to your dietitian to identify your daily goals for nutrients listed on the label. Shopping  Avoid buying canned, premade, or processed foods. These  foods tend to be high in fat, sodium, and added sugar.  Shop around the outside edge of the grocery store. This includes fresh fruits and vegetables, bulk grains, fresh meats, and fresh dairy. Cooking  Use low-heat cooking methods, such as baking, instead of high-heat cooking methods like deep frying.  Cook using healthy oils, such as olive, canola, or sunflower oil.  Avoid cooking with butter, cream, or high-fat meats. Meal planning  Eat meals and snacks regularly, preferably at the same times every day. Avoid going long periods of time without eating.  Eat foods high in fiber, such as fresh fruits, vegetables, beans, and whole grains. Talk to your dietitian about how many servings of carbohydrates you can eat at each meal.  Eat 4-6 ounces of lean protein each day, such as lean meat, chicken, fish, eggs, or tofu. 1 ounce is equal to 1 ounce of meat, chicken, or fish, 1 egg, or 1/4 cup of tofu.  Eat some foods each day that contain healthy fats, such as avocado, nuts, seeds, and fish. Lifestyle   Check your blood glucose regularly.  Exercise at least 30 minutes 5 or more days each week, or as told by your health care provider.  Take medicines as told by your health care provider.  Do not use any products that contain nicotine or tobacco, such as cigarettes and e-cigarettes. If you need help quitting, ask your health care provider.  Work with a counselor or diabetes educator to identify strategies to manage stress and any emotional and social challenges. What are some questions to ask my health care provider?  Do I need to meet with a diabetes educator?  Do I need to meet with a dietitian?  What number can I call if I have questions?  When are the best times to check my blood glucose? Where to find more information:  American Diabetes Association: diabetes.org/food-and-fitness/food  Academy of Nutrition and Dietetics:  www.eatright.org/resources/health/diseases-and-conditions/diabetes  National Institute of Diabetes and Digestive and Kidney Diseases (NIH): www.niddk.nih.gov/health-information/diabetes/overview/diet-eating-physical-activity Summary  A healthy meal plan will help you control your blood glucose and maintain a healthy lifestyle.  Working with a diet and nutrition specialist (dietitian) can help you make a meal plan that is best for you.  Keep in mind that carbohydrates and alcohol have immediate effects on your blood glucose levels. It is important to count carbohydrates and to use alcohol carefully. This information is not intended to replace advice given to you by your health care provider. Make sure you discuss any questions you have with your health care provider. Document Released: 08/03/2005 Document Revised: 12/11/2016 Document Reviewed: 12/11/2016 Elsevier Interactive Patient Education  2018 [employer]  

## 2018-09-18 NOTE — Progress Notes (Signed)
[facility] [place], [place] [address]  Internal MEDICINE  Office Visit Note  Patient Name: Joe Berry  [id number]  [id number]  Date of Service: 09/18/2018  Chief Complaint  Patient presents with  . Annual Exam  . Diabetes  . Anxiety  . COPD     HPI Pt is here for routine health maintenance examination.  He is a 62 year old overweight Caucasian male.  He has a history of diabetes, anxiety, COPD and obstructive sleep apnea.  He reports symptom relief from his OSA when using the CPAP.  He reports using the CPAP every night, and denies any issues.  Patient denies any recent episodes of anxiety, and cannot remember when the last episode was.  He does not currently take any medications for this. He was recently diagnosed with Diabetes at his employers health fair. His A1C was found to be 13.9.  He has been on metformin for approximately 3 months and his A1c today is 9.4.  He is currently taking 1500 mg of metformin daily.  He has rigouriously changed his diet and has begun to walk for exercise.  He is down to drinking 1-2 diet sodas a day, and before he was drinking 6-8 sodas daily. He does report some intermittent bilateral foot pain, he describes as an aching/burning sensation.  He reports it is much worse when his blood sugar is high.  His morning blood sugars have been running 170-230 mg/dl and his evening sugars have been as much as 300 mg/dl.  He reports he knows when it will be high, due to his diet.  He is slowly learning what he can and can not eat, as well as approiate portion control. He denies any breathing issues or changes, and appears to have his COPD stabilized.       Current Medication: Outpatient Encounter Medications as of 09/18/2018  Medication Sig  . ACCU-CHEK FASTCLIX LANCETS MISC Use to test blood sugars twice daily e11.65  . aspirin EC 81 MG tablet Take 1 tablet (81 mg total) by mouth daily.  . Blood Glucose Monitoring Suppl (ONETOUCH VERIO)  w/Device KIT Use kit to test blood sugars . Dx e11.65  . diphenhydrAMINE HCl (ZZZQUIL) 50 MG/30ML LIQD Take 30 mLs by mouth at bedtime as needed (sleep).  Marland Kitchen glucose blood (ONETOUCH VERIO) test strip Use as instructed , test blood sugars twice a day. e11.65  . metFORMIN (GLUCOPHAGE) 500 MG tablet Take 1 tablet (500 mg total) by mouth 3 (three) times daily with meals.  . sacubitril-valsartan (ENTRESTO) 24-26 MG Take 1 tablet by mouth 2 (two) times daily.  . furosemide (LASIX) 40 MG tablet Take 1 tablet (40 mg total) by mouth daily.  . metoprolol succinate (TOPROL-XL) 50 MG 24 hr tablet Take 1 tablet (50 mg total) by mouth daily. Take with or immediately following a meal.  . potassium chloride (K-DUR) 10 MEQ tablet Take 1 tablet (10 mEq total) by mouth daily. (Patient taking differently: Take 20 mEq by mouth daily. )  . spironolactone (ALDACTONE) 25 MG tablet Take 1 tablet (25 mg total) by mouth daily.  Marland Kitchen zolpidem (AMBIEN) 10 MG tablet Take 1 tablet (10 mg total) by mouth at bedtime as needed for up to 2 days for sleep (For sleep study).  . [DISCONTINUED] dapagliflozin propanediol (FARXIGA) 10 MG TABS tablet Take 10 mg by mouth daily.  . [DISCONTINUED] Dulaglutide (TRULICITY) 1.5 TD/1.7OH SOPN Inject 1.5 mg into the skin once a week.   No facility-administered encounter medications on  file as of 09/18/2018.     Surgical History: Past Surgical History:  Procedure Laterality Date  . LEFT HEART CATH AND CORONARY ANGIOGRAPHY Left 04/12/2018   Procedure: LEFT HEART CATH AND CORONARY ANGIOGRAPHY;  Surgeon: Minna Merritts, MD;  Location: [facility];  Service: Cardiovascular;  Laterality: Left;    Medical History: Past Medical History:  Diagnosis Date  . Anxiety   . Cataract   . CHF (congestive heart failure) ([place])   . COPD (chronic obstructive pulmonary disease) ([place])   . Sleep apnea     Family History: Family History  Problem Relation Age of Onset  . Heart disease Mother   .  Lung cancer Father   . Heart disease Maternal Grandmother   . Heart Problems Maternal Grandmother   . Heart Problems Brother   . Heart Problems Maternal Grandfather       Review of Systems  Constitutional: Negative.  Negative for chills, fatigue and unexpected weight change.  HENT: Negative.  Negative for congestion, rhinorrhea, sneezing and sore throat.   Eyes: Negative for redness.  Respiratory: Negative.  Negative for cough, chest tightness and shortness of breath.   Cardiovascular: Negative.  Negative for chest pain and palpitations.  Gastrointestinal: Negative.  Negative for abdominal pain, constipation, diarrhea, nausea and vomiting.  Endocrine: Negative.   Genitourinary: Negative.  Negative for dysuria and frequency.  Musculoskeletal: Negative.  Negative for arthralgias, back pain, joint swelling and neck pain.  Skin: Negative.  Negative for rash.  Allergic/Immunologic: Negative.   Neurological: Negative.  Negative for tremors and numbness.  Hematological: Negative for adenopathy. Does not bruise/bleed easily.  Psychiatric/Behavioral: Negative.  Negative for behavioral problems, sleep disturbance and suicidal ideas. The patient is not nervous/anxious.      Vital Signs: BP 118/78 (BP Location: Left Arm, Patient Position: Sitting, Cuff Size: Normal)   Pulse 87   Resp 16   Ht '5\' 8"'  (1.727 m)   Wt 206 lb (93.4 kg)   SpO2 99%   BMI 31.32 kg/m    Physical Exam  Constitutional: He is oriented to person, place, and time. He appears well-developed and well-nourished. No distress.  HENT:  Head: Normocephalic and atraumatic.  Mouth/Throat: Oropharynx is clear and moist. No oropharyngeal exudate.  Eyes: Pupils are equal, round, and reactive to light. EOM are normal.  Neck: Normal range of motion. Neck supple. No JVD present. No tracheal deviation present. No thyromegaly present.  Cardiovascular: Normal rate, regular rhythm and normal heart sounds. Exam reveals no gallop and no  friction rub.  No murmur heard. Pulmonary/Chest: Effort normal and breath sounds normal. No respiratory distress. He has no wheezes. He has no rales. He exhibits no tenderness.  Abdominal: Soft. There is no tenderness. There is no guarding.  Musculoskeletal: Normal range of motion.  Lymphadenopathy:    He has no cervical adenopathy.  Neurological: He is alert and oriented to person, place, and time. No cranial nerve deficit.  Skin: Skin is warm and dry. He is not diaphoretic.  Psychiatric: He has a normal mood and affect. His behavior is normal. Judgment and thought content normal.  Nursing note and vitals reviewed.    LABS: Recent Results (from the past 2160 hour(s))  POCT HgB A1C     Status: Abnormal   Collection Time: 09/18/18  9:12 AM  Result Value Ref Range   Hemoglobin A1C 9.4 (A) 4.0 - 5.6 %   HbA1c POC (<> result, manual entry)     HbA1c, POC (prediabetic range)  HbA1c, POC (controlled diabetic range)      Assessment/Plan: 1. Encounter for general adult medical examination with abnormal findings Pt is up to date on PHM. Will order labs and evaluate when results are available.  - CBC with Differential/Platelet - Lipid Panel With LDL/HDL Ratio - TSH - T4, free - Comprehensive metabolic panel  2. Uncontrolled type 2 diabetes mellitus with hyperglycemia (HCC)  Continue Metformin 1515m daily.   Add Trulicity 16.3OTsubcutaneously weekly.    We again discussed diabetic diet and exercise to help improve A1C.  Patient is willing to try injectable medication at this time, therefore a trial of Trulicity is ordered.   - Microalbumin, urine - POCT HgB A1C  3. Obstructive sleep apnea Continue to use CPAP nightly.    4. Dilated cardiomyopathy (HCC) Currently stable. Follow up with Cardiology as scheduled.   5. Dysuria - UA/M w/rflx Culture, Routine  6. Class 1 obesity due to excess calories with serious comorbidity and body mass index (BMI) of 31.0 to 31.9 in  adult Encouraged Pt to continue working on weight loss.   Obesity Counseling: Risk Assessment: An assessment of behavioral risk factors was made today and includes lack of exercise sedentary lifestyle, lack of portion control and poor dietary habits.  Risk Modification Advice: She was counseled on portion control guidelines. Restricting daily caloric intake to. . The detrimental long term effects of obesity on her health and ongoing poor compliance was also discussed with the patient.     General Counseling: Rnazair fortenberryunderstanding of the findings of todays visit and agrees with plan of treatment. I have discussed any further diagnostic evaluation that may be needed or ordered today. We also reviewed his medications today. he has been encouraged to call the office with any questions or concerns that should arise related to todays visit.   Orders Placed This Encounter  Procedures  . UA/M w/rflx Culture, Routine  . Microalbumin, urine  . CBC with Differential/Platelet  . Lipid Panel With LDL/HDL Ratio  . TSH  . T4, free  . Comprehensive metabolic panel  . POCT HgB A1C    Meds ordered this encounter  Medications  . DISCONTD: Dulaglutide (TRULICITY) 1.5 MRR/1.1AFSOPN    Sig: Inject 1.5 mg into the skin once a week.    Dispense:  5 pen    Refill:  4    Time spent: 30 Minutes   This patient was seen by AOrson GearAGNP-C in Collaboration with Dr FLavera Guiseas a part of collaborative care agreement    AKendell BaneAGNP-C Internal Medicine

## 2018-09-19 LAB — UA/M W/RFLX CULTURE, ROUTINE
Bilirubin, UA: NEGATIVE
Ketones, UA: NEGATIVE
Leukocytes, UA: NEGATIVE
Nitrite, UA: NEGATIVE
Protein, UA: NEGATIVE
RBC, UA: NEGATIVE
Specific Gravity, UA: 1.01 (ref 1.005–1.030)
Urobilinogen, Ur: 0.2 mg/dL (ref 0.2–1.0)
pH, UA: 6 (ref 5.0–7.5)

## 2018-09-19 LAB — MICROSCOPIC EXAMINATION
Bacteria, UA: NONE SEEN
Casts: NONE SEEN /lpf
RBC, UA: NONE SEEN /hpf (ref 0–2)
WBC, UA: NONE SEEN /hpf (ref 0–5)

## 2018-09-19 LAB — MICROALBUMIN, URINE: Microalbumin, Urine: 15.6 ug/mL

## 2018-09-21 DIAGNOSIS — G4733 Obstructive sleep apnea (adult) (pediatric): Secondary | ICD-10-CM | POA: Diagnosis not present

## 2018-09-26 DIAGNOSIS — R5381 Other malaise: Secondary | ICD-10-CM | POA: Diagnosis not present

## 2018-09-27 LAB — COMPREHENSIVE METABOLIC PANEL
ALT: 13 IU/L (ref 0–44)
AST: 15 IU/L (ref 0–40)
Albumin/Globulin Ratio: 1.4 (ref 1.2–2.2)
Albumin: 4.2 g/dL (ref 3.6–4.8)
Alkaline Phosphatase: 35 IU/L — ABNORMAL LOW (ref 39–117)
BUN/Creatinine Ratio: 20 (ref 10–24)
BUN: 15 mg/dL (ref 8–27)
Bilirubin Total: 0.4 mg/dL (ref 0.0–1.2)
CO2: 27 mmol/L (ref 20–29)
Calcium: 9.6 mg/dL (ref 8.6–10.2)
Chloride: 95 mmol/L — ABNORMAL LOW (ref 96–106)
Creatinine, Ser: 0.76 mg/dL (ref 0.76–1.27)
GFR calc Af Amer: 112 mL/min/{1.73_m2} (ref >59)
GFR calc non Af Amer: 97 mL/min/{1.73_m2} (ref >59)
Globulin, Total: 3 g/dL (ref 1.5–4.5)
Glucose: 138 mg/dL — ABNORMAL HIGH (ref 65–99)
Potassium: 4.8 mmol/L (ref 3.5–5.2)
Sodium: 139 mmol/L (ref 134–144)
Total Protein: 7.2 g/dL (ref 6.0–8.5)

## 2018-09-27 LAB — CBC WITH DIFFERENTIAL/PLATELET
Basophils Absolute: 0.1 10*3/uL (ref 0.0–0.2)
Basos: 1 %
EOS (ABSOLUTE): 0.2 10*3/uL (ref 0.0–0.4)
Eos: 3 %
Hematocrit: 46.6 % (ref 37.5–51.0)
Hemoglobin: 15.6 g/dL (ref 13.0–17.7)
Immature Grans (Abs): 0 10*3/uL (ref 0.0–0.1)
Immature Granulocytes: 0 %
Lymphocytes Absolute: 1.6 10*3/uL (ref 0.7–3.1)
Lymphs: 20 %
MCH: 29.2 pg (ref 26.6–33.0)
MCHC: 33.5 g/dL (ref 31.5–35.7)
MCV: 87 fL (ref 79–97)
Monocytes Absolute: 0.9 10*3/uL (ref 0.1–0.9)
Monocytes: 11 %
Neutrophils Absolute: 5.3 10*3/uL (ref 1.4–7.0)
Neutrophils: 65 %
Platelets: 282 10*3/uL (ref 150–450)
RBC: 5.34 x10E6/uL (ref 4.14–5.80)
RDW: 12.4 % (ref 12.3–15.4)
WBC: 8.2 10*3/uL (ref 3.4–10.8)

## 2018-09-27 LAB — LIPID PANEL WITH LDL/HDL RATIO
Cholesterol, Total: 136 mg/dL (ref 100–199)
HDL: 31 mg/dL — ABNORMAL LOW (ref >39)
LDL Calculated: 81 mg/dL (ref 0–99)
LDl/HDL Ratio: 2.6 ratio (ref 0.0–3.6)
Triglycerides: 120 mg/dL (ref 0–149)
VLDL Cholesterol Cal: 24 mg/dL (ref 5–40)

## 2018-09-27 LAB — T4, FREE: Free T4: 1.21 ng/dL (ref 0.82–1.77)

## 2018-09-27 LAB — TSH: TSH: 2.02 u[IU]/mL (ref 0.450–4.500)

## 2018-10-10 ENCOUNTER — Encounter: Payer: Self-pay | Admitting: Internal Medicine

## 2018-10-10 ENCOUNTER — Ambulatory Visit: Payer: BLUE CROSS/BLUE SHIELD | Admitting: Internal Medicine

## 2018-10-10 VITALS — BP 128/82 | HR 80 | Resp 16 | Ht 68.0 in | Wt 205.0 lb

## 2018-10-10 DIAGNOSIS — E6609 Other obesity due to excess calories: Secondary | ICD-10-CM

## 2018-10-10 DIAGNOSIS — Z9989 Dependence on other enabling machines and devices: Secondary | ICD-10-CM

## 2018-10-10 DIAGNOSIS — G4710 Hypersomnia, unspecified: Secondary | ICD-10-CM

## 2018-10-10 DIAGNOSIS — I420 Dilated cardiomyopathy: Secondary | ICD-10-CM | POA: Diagnosis not present

## 2018-10-10 DIAGNOSIS — G4733 Obstructive sleep apnea (adult) (pediatric): Secondary | ICD-10-CM

## 2018-10-10 DIAGNOSIS — Z6831 Body mass index (BMI) 31.0-31.9, adult: Secondary | ICD-10-CM

## 2018-10-14 NOTE — Progress Notes (Signed)
[facility] [place], [place] [address]  Pulmonary Sleep Medicine   Office Visit Note  Patient Name: Joe Berry DOB: 1955-12-15 MRN [id number]  Date of Service: 10/14/2018  Complaints/HPI: Pt is here for follow up on CPAP. He reports excellent compliance.  He reports good resolution of symptoms when using CPAP.  He denies any mask leak, or issues with machine.  He is cleaning it, and replacing parts as indicated.  He denies any cough, sinus symptoms, or hemoptysis.     ROS  General: (-) fever, (-) chills, (-) night sweats, (-) weakness Skin: (-) rashes, (-) itching,. Eyes: (-) visual changes, (-) redness, (-) itching. Nose and Sinuses: (-) nasal stuffiness or itchiness, (-) postnasal drip, (-) nosebleeds, (-) sinus trouble. Mouth and Throat: (-) sore throat, (-) hoarseness. Neck: (-) swollen glands, (-) enlarged thyroid, (-) neck pain. Respiratory: - cough, (-) bloody sputum, - shortness of breath, - wheezing. Cardiovascular: - ankle swelling, (-) chest pain. Lymphatic: (-) lymph node enlargement. Neurologic: (-) numbness, (-) tingling. Psychiatric: (-) anxiety, (-) depression   Current Medication: Outpatient Encounter Medications as of 10/10/2018  Medication Sig  . ACCU-CHEK FASTCLIX LANCETS MISC Use to test blood sugars twice daily e11.65  . aspirin EC 81 MG tablet Take 1 tablet (81 mg total) by mouth daily.  . Blood Glucose Monitoring Suppl (ONETOUCH VERIO) w/Device KIT Use kit to test blood sugars . Dx e11.65  . diphenhydrAMINE HCl (ZZZQUIL) 50 MG/30ML LIQD Take 30 mLs by mouth at bedtime as needed (sleep).  . Dulaglutide (TRULICITY) 1.5 UV/2.5DG SOPN once weekly, any time of day, with or without food. Inject subcutaneously in the abdomen, thigh, or upper arm. e11.65  . glucose blood (ONETOUCH VERIO) test strip Use as instructed , test blood sugars twice a day. e11.65  . metFORMIN (GLUCOPHAGE) 500 MG tablet Take 1 tablet (500 mg total) by mouth 3  (three) times daily with meals.  . sacubitril-valsartan (ENTRESTO) 24-26 MG Take 1 tablet by mouth 2 (two) times daily.  . furosemide (LASIX) 40 MG tablet Take 1 tablet (40 mg total) by mouth daily.  . metoprolol succinate (TOPROL-XL) 50 MG 24 hr tablet Take 1 tablet (50 mg total) by mouth daily. Take with or immediately following a meal.  . potassium chloride (K-DUR) 10 MEQ tablet Take 1 tablet (10 mEq total) by mouth daily. (Patient taking differently: Take 20 mEq by mouth daily. )  . spironolactone (ALDACTONE) 25 MG tablet Take 1 tablet (25 mg total) by mouth daily.  Marland Kitchen zolpidem (AMBIEN) 10 MG tablet Take 1 tablet (10 mg total) by mouth at bedtime as needed for up to 2 days for sleep (For sleep study).   No facility-administered encounter medications on file as of 10/10/2018.     Surgical History: Past Surgical History:  Procedure Laterality Date  . LEFT HEART CATH AND CORONARY ANGIOGRAPHY Left 04/12/2018   Procedure: LEFT HEART CATH AND CORONARY ANGIOGRAPHY;  Surgeon: Minna Merritts, MD;  Location: [facility];  Service: Cardiovascular;  Laterality: Left;    Medical History: Past Medical History:  Diagnosis Date  . Anxiety   . Cataract   . CHF (congestive heart failure) ([place])   . COPD (chronic obstructive pulmonary disease) ([place])   . Sleep apnea     Family History: Family History  Problem Relation Age of Onset  . Heart disease Mother   . Lung cancer Father   . Heart disease Maternal Grandmother   . Heart Problems Maternal Grandmother   .  Heart Problems Brother   . Heart Problems Maternal Grandfather     Social History: Social History   Socioeconomic History  . Marital status: Single    Spouse name: Not on file  . Number of children: Not on file  . Years of education: Not on file  . Highest education level: Not on file  Occupational History  . Not on file  Social Needs  . Financial resource strain: Not on file  . Food insecurity:    Worry: Not on file     Inability: Not on file  . Transportation needs:    Medical: Not on file    Non-medical: Not on file  Tobacco Use  . Smoking status: Former Smoker    Packs/day: 1.50    Years: 40.00    Pack years: 60.00    Types: Cigarettes  . Smokeless tobacco: Never Used  . Tobacco comment: quit 2008  Substance and Sexual Activity  . Alcohol use: Yes    Frequency: Never    Comment: occasional drink  . Drug use: Never  . Sexual activity: Not on file  Lifestyle  . Physical activity:    Days per week: Not on file    Minutes per session: Not on file  . Stress: Not on file  Relationships  . Social connections:    Talks on phone: Not on file    Gets together: Not on file    Attends religious service: Not on file    Active member of club or organization: Not on file    Attends meetings of clubs or organizations: Not on file    Relationship status: Not on file  . Intimate partner violence:    Fear of current or ex partner: Not on file    Emotionally abused: Not on file    Physically abused: Not on file    Forced sexual activity: Not on file  Other Topics Concern  . Not on file  Social History Narrative  . Not on file    Vital Signs: Blood pressure 128/82, pulse 80, resp. rate 16, height 5' 8" (1.727 m), weight 205 lb (93 kg), SpO2 98 %.  Examination: General Appearance: The patient is well-developed, well-nourished, and in no distress. Skin: Gross inspection of skin unremarkable. Head: normocephalic, no gross deformities. Eyes: no gross deformities noted. ENT: ears appear grossly normal no exudates. Neck: Supple. No thyromegaly. No LAD. Respiratory: clear to auscultation bilateraly. Cardiovascular: Normal S1 and S2 without murmur or rub. Extremities: No cyanosis. pulses are equal. Neurologic: Alert and oriented. No involuntary movements.  LABS: Recent Results (from the past 2160 hour(s))  UA/M w/rflx Culture, Routine     Status: Abnormal   Collection Time: 09/18/18  9:06 AM   Result Value Ref Range   Specific Gravity, UA 1.010 1.005 - 1.030   pH, UA 6.0 5.0 - 7.5   Color, UA Yellow Yellow   Appearance Ur Clear Clear   Leukocytes, UA Negative Negative   Protein, UA Negative Negative/Trace   Glucose, UA 1+ (A) Negative   Ketones, UA Negative Negative   RBC, UA Negative Negative   Bilirubin, UA Negative Negative   Urobilinogen, Ur 0.2 0.2 - 1.0 mg/dL   Nitrite, UA Negative Negative   Microscopic Examination Comment     Comment: Microscopic follows if indicated.   Microscopic Examination See below:     Comment: Microscopic was indicated and was performed.   Urinalysis Reflex Comment     Comment: This specimen will not reflex  to a Urine Culture.  Microalbumin, urine     Status: None   Collection Time: 09/18/18  9:06 AM  Result Value Ref Range   Microalbumin, Urine 15.6 Not Estab. ug/mL  Microscopic Examination     Status: None   Collection Time: 09/18/18  9:06 AM  Result Value Ref Range   WBC, UA None seen 0 - 5 /hpf   RBC, UA None seen 0 - 2 /hpf   Epithelial Cells (non renal) 0-10 0 - 10 /hpf   Casts None seen None seen /lpf   Mucus, UA Present Not Estab.   Bacteria, UA None seen None seen/Few  POCT HgB A1C     Status: Abnormal   Collection Time: 09/18/18  9:12 AM  Result Value Ref Range   Hemoglobin A1C 9.4 (A) 4.0 - 5.6 %   HbA1c POC (<> result, manual entry)     HbA1c, POC (prediabetic range)     HbA1c, POC (controlled diabetic range)    CBC with Differential/Platelet     Status: None   Collection Time: 09/26/18 10:27 AM  Result Value Ref Range   WBC 8.2 3.4 - 10.8 x10E3/uL   RBC 5.34 4.14 - 5.80 x10E6/uL   Hemoglobin 15.6 13.0 - 17.7 g/dL   Hematocrit 46.6 37.5 - 51.0 %   MCV 87 79 - 97 fL   MCH 29.2 26.6 - 33.0 pg   MCHC 33.5 31.5 - 35.7 g/dL   RDW 12.4 12.3 - 15.4 %   Platelets 282 150 - 450 x10E3/uL   Neutrophils 65 Not Estab. %   Lymphs 20 Not Estab. %   Monocytes 11 Not Estab. %   Eos 3 Not Estab. %   Basos 1 Not Estab. %    Neutrophils Absolute 5.3 1.4 - 7.0 x10E3/uL   Lymphocytes Absolute 1.6 0.7 - 3.1 x10E3/uL   Monocytes Absolute 0.9 0.1 - 0.9 x10E3/uL   EOS (ABSOLUTE) 0.2 0.0 - 0.4 x10E3/uL   Basophils Absolute 0.1 0.0 - 0.2 x10E3/uL   Immature Granulocytes 0 Not Estab. %   Immature Grans (Abs) 0.0 0.0 - 0.1 x10E3/uL  Lipid Panel With LDL/HDL Ratio     Status: Abnormal   Collection Time: 09/26/18 10:27 AM  Result Value Ref Range   Cholesterol, Total 136 100 - 199 mg/dL   Triglycerides 120 0 - 149 mg/dL   HDL 31 (L) >39 mg/dL   VLDL Cholesterol Cal 24 5 - 40 mg/dL   LDL Calculated 81 0 - 99 mg/dL   LDl/HDL Ratio 2.6 0.0 - 3.6 ratio    Comment:                                     LDL/HDL Ratio                                             Men  Women                               1/2 Avg.Risk  1.0    1.5  Avg.Risk  3.6    3.2                                2X Avg.Risk  6.2    5.0                                3X Avg.Risk  8.0    6.1   TSH     Status: None   Collection Time: 09/26/18 10:27 AM  Result Value Ref Range   TSH 2.020 0.450 - 4.500 uIU/mL  T4, free     Status: None   Collection Time: 09/26/18 10:27 AM  Result Value Ref Range   Free T4 1.21 0.82 - 1.77 ng/dL  Comprehensive metabolic panel     Status: Abnormal   Collection Time: 09/26/18 10:27 AM  Result Value Ref Range   Glucose 138 (H) 65 - 99 mg/dL   BUN 15 8 - 27 mg/dL   Creatinine, Ser 0.76 0.76 - 1.27 mg/dL   GFR calc non Af Amer 97 >59 mL/min/1.73   GFR calc Af Amer 112 >59 mL/min/1.73   BUN/Creatinine Ratio 20 10 - 24   Sodium 139 134 - 144 mmol/L   Potassium 4.8 3.5 - 5.2 mmol/L   Chloride 95 (L) 96 - 106 mmol/L   CO2 27 20 - 29 mmol/L   Calcium 9.6 8.6 - 10.2 mg/dL   Total Protein 7.2 6.0 - 8.5 g/dL   Albumin 4.2 3.6 - 4.8 g/dL   Globulin, Total 3.0 1.5 - 4.5 g/dL   Albumin/Globulin Ratio 1.4 1.2 - 2.2   Bilirubin Total 0.4 0.0 - 1.2 mg/dL   Alkaline Phosphatase 35 (L) 39 - 117 IU/L    AST 15 0 - 40 IU/L   ALT 13 0 - 44 IU/L    Radiology: No results found.  No results found.  No results found.    Assessment and Plan: Patient Active Problem List   Diagnosis Date Noted  . Pulmonary HTN (HCC) 05/04/2018  . Unstable angina (HCC) 04/04/2018  . Dilated cardiomyopathy (HCC) 04/03/2018  . Coronary artery calcification seen on CAT scan 04/03/2018  . Empyema lung (HCC) 03/12/2018  . Pleural effusion 03/12/2018  . Pleural plaque without asbestos 03/12/2018  . SOB (shortness of breath) 03/12/2018  . Lung mass 03/12/2018    1. OSA on CPAP Pt is to continue to use CPAP as directed.   2. Hypersomnia Improved, since starting cpap therapy.  3. Dilated cardiomyopathy (HCC) Will continue to follow up with cardiology  4. Class 1 obesity due to excess calories with serious comorbidity and body mass index (BMI) of 31.0 to 31.9 in adult Obesity Counseling: Risk Assessment: An assessment of behavioral risk factors was made today and includes lack of exercise sedentary lifestyle, lack of portion control and poor dietary habits.  Risk Modification Advice: She was counseled on portion control guidelines. Restricting daily caloric intake to. . The detrimental long term effects of obesity on her health and ongoing poor compliance was also discussed with the patient.  General Counseling: I have discussed the findings of the evaluation and examination with Earle.  I have also discussed any further diagnostic evaluation thatmay be needed or ordered today. Evelio verbalizes understanding of the findings of todays visit. We also reviewed his medications today and discussed drug interactions and side effects including but not limited excessive   drowsiness and altered mental states. We also discussed that there is always a risk not just to him but also people around him. he has been encouraged to call the office with any questions or concerns that should arise related to todays  visit.    Time spent: 25 This patient was seen by Adam Scarboro AGNP-C in Collaboration with Dr. Saadat Khan as a part of collaborative care agreement.   I have personally obtained a history, examined the patient, evaluated laboratory and imaging results, formulated the assessment and plan and placed orders.    Saadat A Khan, MD FCCP Pulmonary and Critical Care Sleep medicine 

## 2018-10-30 ENCOUNTER — Telehealth: Payer: Self-pay

## 2018-10-30 NOTE — Telephone Encounter (Signed)
Spoke with pt about his [facility] bill his insurance is not covering the lab work done due to coding pt stated he will bring the bill to the office and drop it off. I informed the pt that I will speak with [facility] and resubmit codes.

## 2018-11-04 ENCOUNTER — Telehealth: Payer: Self-pay

## 2018-11-04 NOTE — Progress Notes (Signed)
Cardiology Office Note  Date:  11/05/2018   ID:  Joe Berry, DOB [birth year], MRN [id number]  PCP:  Kendell Bane, NP   Chief Complaint  Patient presents with  . other    6 mo follow up. Medications reviewed verbally,.    HPI:   [name] is a 62 year old gentleman with past medical history of Smoking, quit 2008 Anxiety Coronary calcifications on CT scan Emphysema/COPD Empyema OSA, started CPAP 3 months ago summer 2019 Shortness of breath, Ejection fraction 25-30% Now appears 35 to 40% in 07/2018 Who presents for follow-up of his dilated nonischemic cardiomyopathy  Sugars doing better on trulicity Has not had repeat hemoglobin A1c Poor energy, but feels well  Reports he is tolerating medications Tolerating CPAP over the past 3 months  Echo 08/14/2018, results reviewed with him Cardiac function improved Was 25% Now appears 35 to 40%  Tolerating Lasix 40 mg daily, potassium 10 daily  No regular exercise program Try to lose weight through dietary changes  EKG personally reviewed by myself on todays visit Shows normal sinus rhythm with rate 87 bpm nonspecific T wave abnormality V4 through V6, 1 and aVL  Cardiac cath 04/12/2018 results reviewed Nonobstructive disease with severely reduced LV function EF <20%, global  Right Heart cath (very difficult procedure as unable to measure pressures in the PA and wedge) RA pressures: 25 mm Hg RV pressures: 77/17/mean of 25 LVEDP: 32 mm Hg  CT scan images showing significant coronary calcification all 3 vessels  Echocardiogram edges pulled up with him in the office showing severely depressed LV function moderately dilated LV ejection fraction 25-30% dated 03/19/2018   history of sleep apnea and he is non compliant with CPAP therapy.     PMH:   has a past medical history of Anxiety, Cataract, CHF (congestive heart failure) ([place]), COPD (chronic obstructive pulmonary disease) ([place]), and Sleep apnea.  PSH:    Past  Surgical History:  Procedure Laterality Date  . LEFT HEART CATH AND CORONARY ANGIOGRAPHY Left 04/12/2018   Procedure: LEFT HEART CATH AND CORONARY ANGIOGRAPHY;  Surgeon: Minna Merritts, MD;  Location: [facility];  Service: Cardiovascular;  Laterality: Left;    Current Outpatient Medications  Medication Sig Dispense Refill  . ACCU-CHEK FASTCLIX LANCETS MISC Use to test blood sugars twice daily e11.65 102 each 11  . aspirin EC 81 MG tablet Take 1 tablet (81 mg total) by mouth daily. 90 tablet 3  . Blood Glucose Monitoring Suppl (ONETOUCH VERIO) w/Device KIT Use kit to test blood sugars . Dx e11.65 1 kit 0  . diphenhydrAMINE HCl (ZZZQUIL) 50 MG/30ML LIQD Take 30 mLs by mouth at bedtime as needed (sleep).    . Dulaglutide (TRULICITY) 1.5 WU/9.8JX SOPN once weekly, any time of day, with or without food. Inject subcutaneously in the abdomen, thigh, or upper arm. e11.65 12 pen 1  . furosemide (LASIX) 40 MG tablet Take 1 tablet (40 mg total) by mouth daily. 90 tablet 3  . glucose blood (ONETOUCH VERIO) test strip Use as instructed , test blood sugars twice a day. e11.65 100 each 11  . metFORMIN (GLUCOPHAGE) 500 MG tablet Take 1 tablet (500 mg total) by mouth 3 (three) times daily with meals. 270 tablet 1  . metoprolol succinate (TOPROL-XL) 50 MG 24 hr tablet Take 1 tablet (50 mg total) by mouth daily. Take with or immediately following a meal. 90 tablet 3  . potassium chloride (K-DUR) 10 MEQ tablet Take 1 tablet (10 mEq total)  by mouth daily. (Patient taking differently: Take 20 mEq by mouth daily. ) 90 tablet 3  . sacubitril-valsartan (ENTRESTO) 24-26 MG Take 1 tablet by mouth 2 (two) times daily. 180 tablet 3  . spironolactone (ALDACTONE) 25 MG tablet Take 1 tablet (25 mg total) by mouth daily. 90 tablet 3   No current facility-administered medications for this visit.      Allergies:   Patient has no known allergies.   Social History:  The patient  reports that he has quit smoking.  His smoking use included cigarettes. He has a 60.00 pack-year smoking history. He has never used smokeless tobacco. He reports current alcohol use. He reports that he does not use drugs.   Family History:   family history includes Heart Problems in his brother, maternal grandfather, and maternal grandmother; Heart disease in his maternal grandmother and mother; Lung cancer in his father.    Review of Systems: Review of Systems  Constitutional: Positive for fatigue Respiratory: Denies shortness of breath Cardiovascular: Negative.   Denies chest pain Gastrointestinal: Negative.   Musculoskeletal: Negative.   Neurological: Negative.   Psychiatric/Behavioral: Negative.   All other systems reviewed and are negative.    PHYSICAL EXAM: VS:  BP 130/80 (BP Location: Left Arm, Patient Position: Sitting, Cuff Size: Normal)   Ht '5\' 8"'  (1.727 m)   Wt 203 lb (92.1 kg)   BMI 30.87 kg/m  , BMI Body mass index is 30.87 kg/m. Constitutional:  oriented to person, place, and time. No distress.  HENT:  Head: Grossly normal Eyes:  no discharge. No scleral icterus.  Neck: No JVD, no carotid bruits  Cardiovascular: Regular rate and rhythm, no murmurs appreciated Pulmonary/Chest: Clear to auscultation bilaterally, no wheezes or rails Abdominal: Soft.  no distension.  no tenderness.  Musculoskeletal: Normal range of motion Neurological:  normal muscle tone. Coordination normal. No atrophy Skin: Skin warm and dry Psychiatric: normal affect, pleasant   Recent Labs: 09/26/2018: ALT 13; BUN 15; Creatinine, Ser 0.76; Hemoglobin 15.6; Platelets 282; Potassium 4.8; Sodium 139; TSH 2.020    Lipid Panel Lab Results  Component Value Date   CHOL 136 09/26/2018   HDL 31 (L) 09/26/2018   LDLCALC 81 09/26/2018   TRIG 120 09/26/2018      Wt Readings from Last 3 Encounters:  11/05/18 203 lb (92.1 kg)  10/10/18 205 lb (93 kg)  09/18/18 206 lb (93.4 kg)     ASSESSMENT AND PLAN:  Dilated  cardiomyopathy ([place]) -  Nonischemic based off recent cardiac catheterization Continue current medications Blood pressure low, unable to advance his entresto 062 systolic at home  Empyema lung ([place]) Stopped smoking many years ago CT scan with COPD, possible empyema Denies any recent exacerbations  SOB (shortness of breath) Recommended weight loss, walking program for conditioning  Coronary artery calcification with stable angina CT scan with diffuse heavy calcification LAD, circumflex and RCA Coronary artery disease seen on cardiac catheterization Stressed importance of aggressive diabetes control, LDL less than 70, weight loss   Total encounter time more than 25 minutes  Greater than 50% was spent in counseling and coordination of care with the patient  Disposition:   F/U 12 months     Orders Placed This Encounter  Procedures  . EKG 12-Lead     Signed, Esmond Plants, M.D., Ph.D. 11/05/2018  [place], [facility] Office Note  Date:  11/05/2018   ID:  Joe Berry, DOB July 14, 1956, MRN [id number]  PCP:  Kendell Bane,  NP   Chief Complaint  Patient presents with  . other    6 mo follow up. Medications reviewed verbally,.    HPI:   [name] is a 62 year old gentleman with past medical history of Smoking, quit 2008 Anxiety Coronary calcifications on CT scan Emphysema/COPD Empyema Who presents for follow-up of his dilated nonischemic cardiomyopathy Shortness of breath, Ejection fraction 25-30%  Cardiac cath for unstable angina sx on 04/12/2018 Nonobstructive disease with severely reduced LV function EF <20%, global Markedly elevated LVEDP Markedly elevated RA and RV pressures Chronic systolic CHF  Right Heart cath (very difficult procedure as unable to measure pressures in the PA and wedge) RA pressures: 25 mm Hg RV pressures: 77/17/mean of 25 LVEDP: 32 mm Hg   metoprolol succinate was  continued Started on  entresto 24/26 mg po BID, lasix 40 mg daily with potassium 20 meq daily  In follow-up today reports that he feels relatively well but still has low energy 120 /80 on blood pressure, lower on today's visit Denies significant lower extremity edema  EKG personally reviewed by myself on todays visit Shows normal sinus rhythm with rate 93 bpm nonspecific T wave abnormality  Other past medical history reviewed CT scan images showing significant coronary calcification all 3 vessels  Echocardiogram edges pulled up with him in the office showing severely depressed LV function moderately dilated LV ejection fraction 25-30% dated 03/19/2018   history of sleep apnea and he is non compliant with CPAP therapy.     PMH:   has a past medical history of Anxiety, Cataract, CHF (congestive heart failure) ([place]), COPD (chronic obstructive pulmonary disease) ([place]), and Sleep apnea.  PSH:    Past Surgical History:  Procedure Laterality Date  . LEFT HEART CATH AND CORONARY ANGIOGRAPHY Left 04/12/2018   Procedure: LEFT HEART CATH AND CORONARY ANGIOGRAPHY;  Surgeon: Minna Merritts, MD;  Location: [facility];  Service: Cardiovascular;  Laterality: Left;    Current Outpatient Medications  Medication Sig Dispense Refill  . ACCU-CHEK FASTCLIX LANCETS MISC Use to test blood sugars twice daily e11.65 102 each 11  . aspirin EC 81 MG tablet Take 1 tablet (81 mg total) by mouth daily. 90 tablet 3  . Blood Glucose Monitoring Suppl (ONETOUCH VERIO) w/Device KIT Use kit to test blood sugars . Dx e11.65 1 kit 0  . diphenhydrAMINE HCl (ZZZQUIL) 50 MG/30ML LIQD Take 30 mLs by mouth at bedtime as needed (sleep).    . Dulaglutide (TRULICITY) 1.5 IO/9.6EX SOPN once weekly, any time of day, with or without food. Inject subcutaneously in the abdomen, thigh, or upper arm. e11.65 12 pen 1  . furosemide (LASIX) 40 MG tablet Take 1 tablet (40 mg total) by mouth daily. 90 tablet 3  . glucose blood  (ONETOUCH VERIO) test strip Use as instructed , test blood sugars twice a day. e11.65 100 each 11  . metFORMIN (GLUCOPHAGE) 500 MG tablet Take 1 tablet (500 mg total) by mouth 3 (three) times daily with meals. 270 tablet 1  . metoprolol succinate (TOPROL-XL) 50 MG 24 hr tablet Take 1 tablet (50 mg total) by mouth daily. Take with or immediately following a meal. 90 tablet 3  . potassium chloride (K-DUR) 10 MEQ tablet Take 1 tablet (10 mEq total) by mouth daily. (Patient taking differently: Take 20 mEq by mouth daily. ) 90 tablet 3  . sacubitril-valsartan (ENTRESTO) 24-26 MG Take 1 tablet by mouth 2 (two) times daily. 180 tablet 3  . spironolactone (ALDACTONE) 25  MG tablet Take 1 tablet (25 mg total) by mouth daily. 90 tablet 3   No current facility-administered medications for this visit.      Allergies:   Patient has no known allergies.   Social History:  The patient  reports that he has quit smoking. His smoking use included cigarettes. He has a 60.00 pack-year smoking history. He has never used smokeless tobacco. He reports current alcohol use. He reports that he does not use drugs.   Family History:   family history includes Heart Problems in his brother, maternal grandfather, and maternal grandmother; Heart disease in his maternal grandmother and mother; Lung cancer in his father.    Review of Systems: Review of Systems  Constitutional: Positive for malaise/fatigue.  Respiratory: Positive for shortness of breath.   Cardiovascular: Negative.        Chest tightness  Gastrointestinal: Negative.   Musculoskeletal: Negative.   Neurological: Negative.   Psychiatric/Behavioral: Negative.   All other systems reviewed and are negative.    PHYSICAL EXAM: VS:  BP 130/80 (BP Location: Left Arm, Patient Position: Sitting, Cuff Size: Normal)   Ht '5\' 8"'  (1.727 m)   Wt 203 lb (92.1 kg)   BMI 30.87 kg/m  , BMI Body mass index is 30.87 kg/m. Constitutional:  oriented to person, place, and  time. No distress.  HENT:  Head: Normocephalic and atraumatic.  Eyes:  no discharge. No scleral icterus.  Neck: Normal range of motion. Neck supple. No JVD present.  Cardiovascular: Normal rate, regular rhythm, normal heart sounds and intact distal pulses. Exam reveals no gallop and no friction rub. No edema No murmur heard. Pulmonary/Chest: Effort normal and breath sounds normal. No stridor. No respiratory distress.  no wheezes.  no rales.  no tenderness.  Abdominal: Soft.  no distension.  no tenderness.  Musculoskeletal: Normal range of motion.  no  tenderness or deformity.  Neurological:  normal muscle tone. Coordination normal. No atrophy Skin: Skin is warm and dry. No rash noted. not diaphoretic.  Psychiatric:  normal mood and affect. behavior is normal. Thought content normal.    Recent Labs: 09/26/2018: ALT 13; BUN 15; Creatinine, Ser 0.76; Hemoglobin 15.6; Platelets 282; Potassium 4.8; Sodium 139; TSH 2.020    Lipid Panel Lab Results  Component Value Date   CHOL 136 09/26/2018   HDL 31 (L) 09/26/2018   LDLCALC 81 09/26/2018   TRIG 120 09/26/2018      Wt Readings from Last 3 Encounters:  11/05/18 203 lb (92.1 kg)  10/10/18 205 lb (93 kg)  09/18/18 206 lb (93.4 kg)     ASSESSMENT AND PLAN:  Dilated cardiomyopathy ([place]) -  Nonischemic based off recent cardiac catheterization In addition to his current medications we will add spironolactone 25 mg daily Continue Lasix 40 and decreased potassium down to 10 mEq daily Continue metoprolol and entresto  Empyema lung ([facility]) Stopped smoking many years ago CT scan with COPD, possible empyema Managed by pulmonary  SOB (shortness of breath) Likely multifactorial including cardiomyopathy, COPD, conditioning  Coronary artery calcification with stable angina CT scan with diffuse heavy calcification LAD, circumflex and RCA Coronary artery disease seen on cardiac catheterization He would like to check his cholesterol prior to  starting a statin Order has been placed   Total encounter time more than 25 minutes  Greater than 50% was spent in counseling and coordination of care with the patient  Disposition:   F/U 6 months     Orders Placed This Encounter  Procedures  .  EKG 12-Lead     Signed, Esmond Plants, M.D., Ph.D. 11/05/2018  [place], [place]

## 2018-11-04 NOTE — Telephone Encounter (Signed)
Called [facility] and spoke with Johnell Comings in the billing department. Gave a different set of diagnostic codes to be resubmitted to the patient insurance company. Diagnostic codes was resubmitted to the insurance company by Ms Karin Golden.

## 2018-11-05 ENCOUNTER — Ambulatory Visit: Payer: BLUE CROSS/BLUE SHIELD | Admitting: Cardiovascular Disease

## 2018-11-05 ENCOUNTER — Encounter: Payer: Self-pay | Admitting: Cardiovascular Disease

## 2018-11-05 VITALS — BP 130/80 | Ht 68.0 in | Wt 203.0 lb

## 2018-11-05 DIAGNOSIS — I420 Dilated cardiomyopathy: Secondary | ICD-10-CM

## 2018-11-05 DIAGNOSIS — J869 Pyothorax without fistula: Secondary | ICD-10-CM | POA: Diagnosis not present

## 2018-11-05 DIAGNOSIS — R0602 Shortness of breath: Secondary | ICD-10-CM

## 2018-11-05 DIAGNOSIS — I272 Pulmonary hypertension, unspecified: Secondary | ICD-10-CM

## 2018-11-05 NOTE — Patient Instructions (Signed)

## 2018-11-21 DIAGNOSIS — G4733 Obstructive sleep apnea (adult) (pediatric): Secondary | ICD-10-CM | POA: Diagnosis not present

## 2018-12-18 ENCOUNTER — Ambulatory Visit: Payer: BC Managed Care – PPO | Admitting: Adult Health

## 2018-12-18 ENCOUNTER — Encounter: Payer: Self-pay | Admitting: Adult Health

## 2018-12-18 VITALS — BP 119/75 | HR 54 | Resp 16 | Ht 68.0 in | Wt 202.8 lb

## 2018-12-18 DIAGNOSIS — Z9989 Dependence on other enabling machines and devices: Secondary | ICD-10-CM

## 2018-12-18 DIAGNOSIS — E1165 Type 2 diabetes mellitus with hyperglycemia: Secondary | ICD-10-CM

## 2018-12-18 DIAGNOSIS — G4733 Obstructive sleep apnea (adult) (pediatric): Secondary | ICD-10-CM | POA: Diagnosis not present

## 2018-12-18 DIAGNOSIS — I420 Dilated cardiomyopathy: Secondary | ICD-10-CM | POA: Diagnosis not present

## 2018-12-18 DIAGNOSIS — Z6830 Body mass index (BMI) 30.0-30.9, adult: Secondary | ICD-10-CM

## 2018-12-18 DIAGNOSIS — E6609 Other obesity due to excess calories: Secondary | ICD-10-CM | POA: Diagnosis not present

## 2018-12-18 LAB — POCT GLYCOSYLATED HEMOGLOBIN (HGB A1C): Hemoglobin A1C: 7.3 % — AB (ref 4.0–5.6)

## 2018-12-18 NOTE — Patient Instructions (Signed)
Diabetes Mellitus and Nutrition, Adult  When you have diabetes (diabetes mellitus), it is very important to have healthy eating habits because your blood sugar (glucose) levels are greatly affected by what you eat and drink. Eating healthy foods in the appropriate amounts, at about the same times every day, can help you:  · Control your blood glucose.  · Lower your risk of heart disease.  · Improve your blood pressure.  · Reach or maintain a healthy weight.  Every person with diabetes is different, and each person has different needs for a meal plan. Your health care provider may recommend that you work with a diet and nutrition specialist (dietitian) to make a meal plan that is best for you. Your meal plan may vary depending on factors such as:  · The calories you need.  · The medicines you take.  · Your weight.  · Your blood glucose, blood pressure, and cholesterol levels.  · Your activity level.  · Other health conditions you have, such as heart or kidney disease.  How do carbohydrates affect me?  Carbohydrates, also called carbs, affect your blood glucose level more than any other type of food. Eating carbs naturally raises the amount of glucose in your blood. Carb counting is a method for keeping track of how many carbs you eat. Counting carbs is important to keep your blood glucose at a healthy level, especially if you use insulin or take certain oral diabetes medicines.  It is important to know how many carbs you can safely have in each meal. This is different for every person. Your dietitian can help you calculate how many carbs you should have at each meal and for each snack.  Foods that contain carbs include:  · Bread, cereal, rice, pasta, and crackers.  · Potatoes and corn.  · Peas, beans, and lentils.  · Milk and yogurt.  · Fruit and juice.  · Desserts, such as cakes, cookies, ice cream, and candy.  How does alcohol affect me?  Alcohol can cause a sudden decrease in blood glucose (hypoglycemia),  especially if you use insulin or take certain oral diabetes medicines. Hypoglycemia can be a life-threatening condition. Symptoms of hypoglycemia (sleepiness, dizziness, and confusion) are similar to symptoms of having too much alcohol.  If your health care provider says that alcohol is safe for you, follow these guidelines:  · Limit alcohol intake to no more than 1 drink per day for nonpregnant women and 2 drinks per day for men. One drink equals 12 oz of beer, 5 oz of wine, or 1½ oz of hard liquor.  · Do not drink on an empty stomach.  · Keep yourself hydrated with water, diet soda, or unsweetened iced tea.  · Keep in mind that regular soda, juice, and other mixers may contain a lot of sugar and must be counted as carbs.  What are tips for following this plan?    Reading food labels  · Start by checking the serving size on the "Nutrition Facts" label of packaged foods and drinks. The amount of calories, carbs, fats, and other nutrients listed on the label is based on one serving of the item. Many items contain more than one serving per package.  · Check the total grams (g) of carbs in one serving. You can calculate the number of servings of carbs in one serving by dividing the total carbs by 15. For example, if a food has 30 g of total carbs, it would be equal to 2   servings of carbs.  · Check the number of grams (g) of saturated and trans fats in one serving. Choose foods that have low or no amount of these fats.  · Check the number of milligrams (mg) of salt (sodium) in one serving. Most people should limit total sodium intake to less than 2,300 mg per day.  · Always check the nutrition information of foods labeled as "low-fat" or "nonfat". These foods may be higher in added sugar or refined carbs and should be avoided.  · Talk to your dietitian to identify your daily goals for nutrients listed on the label.  Shopping  · Avoid buying canned, premade, or processed foods. These foods tend to be high in fat, sodium,  and added sugar.  · Shop around the outside edge of the grocery store. This includes fresh fruits and vegetables, bulk grains, fresh meats, and fresh dairy.  Cooking  · Use low-heat cooking methods, such as baking, instead of high-heat cooking methods like deep frying.  · Cook using healthy oils, such as olive, canola, or sunflower oil.  · Avoid cooking with butter, cream, or high-fat meats.  Meal planning  · Eat meals and snacks regularly, preferably at the same times every day. Avoid going long periods of time without eating.  · Eat foods high in fiber, such as fresh fruits, vegetables, beans, and whole grains. Talk to your dietitian about how many servings of carbs you can eat at each meal.  · Eat 4-6 ounces (oz) of lean protein each day, such as lean meat, chicken, fish, eggs, or tofu. One oz of lean protein is equal to:  ? 1 oz of meat, chicken, or fish.  ? 1 egg.  ? ¼ cup of tofu.  · Eat some foods each day that contain healthy fats, such as avocado, nuts, seeds, and fish.  Lifestyle  · Check your blood glucose regularly.  · Exercise regularly as told by your health care provider. This may include:  ? 150 minutes of moderate-intensity or vigorous-intensity exercise each week. This could be brisk walking, biking, or water aerobics.  ? Stretching and doing strength exercises, such as yoga or weightlifting, at least 2 times a week.  · Take medicines as told by your health care provider.  · Do not use any products that contain nicotine or tobacco, such as cigarettes and e-cigarettes. If you need help quitting, ask your health care provider.  · Work with a counselor or diabetes educator to identify strategies to manage stress and any emotional and social challenges.  Questions to ask a health care provider  · Do I need to meet with a diabetes educator?  · Do I need to meet with a dietitian?  · What number can I call if I have questions?  · When are the best times to check my blood glucose?  Where to find more  information:  · American Diabetes Association: diabetes.org  · Academy of Nutrition and Dietetics: www.eatright.org  · National Institute of Diabetes and Digestive and Kidney Diseases (NIH): www.niddk.nih.gov  Summary  · A healthy meal plan will help you control your blood glucose and maintain a healthy lifestyle.  · Working with a diet and nutrition specialist (dietitian) can help you make a meal plan that is best for you.  · Keep in mind that carbohydrates (carbs) and alcohol have immediate effects on your blood glucose levels. It is important to count carbs and to use alcohol carefully.  This information is not intended to   replace advice given to you by your health care provider. Make sure you discuss any questions you have with your health care provider.  Document Released: 08/03/2005 Document Revised: 06/06/2017 Document Reviewed: 12/11/2016  Elsevier Interactive Patient Education © 2019 [employer]

## 2018-12-18 NOTE — Progress Notes (Signed)
[facility] [place], [place] [address]  Internal MEDICINE  Office Visit Note  Patient Name: Joe Berry  [id number]  [id number]  Date of Service: 01/01/2019  Chief Complaint  Patient presents with  . Medical Management of Chronic Issues    3 month follow up, pt has been taking trulicity for about 3-4 months and it is working well, pt mentioned he takes it on Wednesday and the on saturday he has this sour belch that comes, insurance wants trulicity to be 3 months supply when ordering refills through escript.    HPI  Patient is here for three-month follow-up on diabetes management.  Overall he reports he is doing well he has now been on Trulicity for approximately 3 months.  His A1c today is 7.3.  This is down from 9.4.  Patient reports he has been making good choices with his diet however he did slip a little over the holidays.  His A1c continues to go in a downward trend which he and I are both very pleased with.  He has also lost 4 pounds since his last visit.  Mr. Bueche feels like he is doing very well at this time and he will continue his current medication regimen and follow-up as expected.   Current Medication: Outpatient Encounter Medications as of 12/18/2018  Medication Sig  . ACCU-CHEK FASTCLIX LANCETS MISC Use to test blood sugars twice daily e11.65  . aspirin EC 81 MG tablet Take 1 tablet (81 mg total) by mouth daily.  . Blood Glucose Monitoring Suppl (ONETOUCH VERIO) w/Device KIT Use kit to test blood sugars . Dx e11.65  . diphenhydrAMINE HCl (ZZZQUIL) 50 MG/30ML LIQD Take 30 mLs by mouth at bedtime as needed (sleep).  . Dulaglutide (TRULICITY) 1.5 YQ/8.2NO SOPN once weekly, any time of day, with or without food. Inject subcutaneously in the abdomen, thigh, or upper arm. e11.65  . glucose blood (ONETOUCH VERIO) test strip Use as instructed , test blood sugars twice a day. e11.65  . metFORMIN (GLUCOPHAGE) 500 MG tablet Take 1 tablet (500 mg total) by  mouth 3 (three) times daily with meals.  . potassium chloride (K-DUR) 10 MEQ tablet Take 1 tablet (10 mEq total) by mouth daily.  . sacubitril-valsartan (ENTRESTO) 24-26 MG Take 1 tablet by mouth 2 (two) times daily.  . furosemide (LASIX) 40 MG tablet Take 1 tablet (40 mg total) by mouth daily.  . metoprolol succinate (TOPROL-XL) 50 MG 24 hr tablet Take 1 tablet (50 mg total) by mouth daily. Take with or immediately following a meal.  . spironolactone (ALDACTONE) 25 MG tablet Take 1 tablet (25 mg total) by mouth daily.   No facility-administered encounter medications on file as of 12/18/2018.     Surgical History: Past Surgical History:  Procedure Laterality Date  . LEFT HEART CATH AND CORONARY ANGIOGRAPHY Left 04/12/2018   Procedure: LEFT HEART CATH AND CORONARY ANGIOGRAPHY;  Surgeon: Minna Merritts, MD;  Location: [facility];  Service: Cardiovascular;  Laterality: Left;    Medical History: Past Medical History:  Diagnosis Date  . Anxiety   . Cataract   . CHF (congestive heart failure) ([place])   . COPD (chronic obstructive pulmonary disease) ([place])   . Sleep apnea     Family History: Family History  Problem Relation Age of Onset  . Heart disease Mother   . Lung cancer Father   . Heart disease Maternal Grandmother   . Heart Problems Maternal Grandmother   . Heart Problems  Brother   . Heart Problems Maternal Grandfather     Social History   Socioeconomic History  . Marital status: Single    Spouse name: Not on file  . Number of children: Not on file  . Years of education: Not on file  . Highest education level: Not on file  Occupational History  . Not on file  Social Needs  . Financial resource strain: Not on file  . Food insecurity:    Worry: Not on file    Inability: Not on file  . Transportation needs:    Medical: Not on file    Non-medical: Not on file  Tobacco Use  . Smoking status: Former Smoker    Packs/day: 1.50    Years: 40.00    Pack years:  60.00    Types: Cigarettes  . Smokeless tobacco: Never Used  . Tobacco comment: quit 2008  Substance and Sexual Activity  . Alcohol use: Yes    Frequency: Never    Comment: occasional drink  . Drug use: Never  . Sexual activity: Not on file  Lifestyle  . Physical activity:    Days per week: Not on file    Minutes per session: Not on file  . Stress: Not on file  Relationships  . Social connections:    Talks on phone: Not on file    Gets together: Not on file    Attends religious service: Not on file    Active member of club or organization: Not on file    Attends meetings of clubs or organizations: Not on file    Relationship status: Not on file  . Intimate partner violence:    Fear of current or ex partner: Not on file    Emotionally abused: Not on file    Physically abused: Not on file    Forced sexual activity: Not on file  Other Topics Concern  . Not on file  Social History Narrative  . Not on file      Review of Systems  Constitutional: Negative.  Negative for chills, fatigue and unexpected weight change.  HENT: Negative.  Negative for congestion, rhinorrhea, sneezing and sore throat.   Eyes: Negative for redness.  Respiratory: Negative.  Negative for cough, chest tightness and shortness of breath.   Cardiovascular: Negative.  Negative for chest pain and palpitations.  Gastrointestinal: Negative.  Negative for abdominal pain, constipation, diarrhea, nausea and vomiting.  Endocrine: Negative.   Genitourinary: Negative.  Negative for dysuria and frequency.  Musculoskeletal: Negative.  Negative for arthralgias, back pain, joint swelling and neck pain.  Skin: Negative.  Negative for rash.  Allergic/Immunologic: Negative.   Neurological: Negative.  Negative for tremors and numbness.  Hematological: Negative for adenopathy. Does not bruise/bleed easily.  Psychiatric/Behavioral: Negative.  Negative for behavioral problems, sleep disturbance and suicidal ideas. The  patient is not nervous/anxious.     Vital Signs: BP 119/75 (BP Location: Right Arm, Patient Position: Sitting, Cuff Size: Large)   Pulse (!) 54   Resp 16   Ht '5\' 8"'  (1.727 m)   Wt 202 lb 12.8 oz (92 kg)   SpO2 96%   BMI 30.84 kg/m    Physical Exam Vitals signs and nursing note reviewed.  Constitutional:      General: He is not in acute distress.    Appearance: He is well-developed. He is not diaphoretic.  HENT:     Head: Normocephalic and atraumatic.     Mouth/Throat:     Pharynx: No oropharyngeal  exudate.  Eyes:     Pupils: Pupils are equal, round, and reactive to light.  Neck:     Musculoskeletal: Normal range of motion and neck supple.     Thyroid: No thyromegaly.     Vascular: No JVD.     Trachea: No tracheal deviation.  Cardiovascular:     Rate and Rhythm: Normal rate and regular rhythm.     Heart sounds: Normal heart sounds. No murmur. No friction rub. No gallop.   Pulmonary:     Effort: Pulmonary effort is normal. No respiratory distress.     Breath sounds: Normal breath sounds. No wheezing or rales.  Chest:     Chest wall: No tenderness.  Abdominal:     Palpations: Abdomen is soft.     Tenderness: There is no abdominal tenderness. There is no guarding.  Musculoskeletal: Normal range of motion.  Lymphadenopathy:     Cervical: No cervical adenopathy.  Skin:    General: Skin is warm and dry.  Neurological:     Mental Status: He is alert and oriented to person, place, and time.     Cranial Nerves: No cranial nerve deficit.  Psychiatric:        Behavior: Behavior normal.        Thought Content: Thought content normal.        Judgment: Judgment normal.    Assessment/Plan: 1. Uncontrolled type 2 diabetes mellitus with hyperglycemia (HCC) A1c today 7.3.  Much improved since initial visit.  Continue current medication regimen continue dietary evolution as well as some exercise throughout the week.  We will continue to follow-up in 3 months with new A1c's. -  POCT HgB A1C  2. Class 1 obesity due to excess calories with serious comorbidity and body mass index (BMI) of 30.0 to 30.9 in adult Obesity Counseling: Risk Assessment: An assessment of behavioral risk factors was made today and includes lack of exercise sedentary lifestyle, lack of portion control and poor dietary habits.  Risk Modification Advice: She was counseled on portion control guidelines. Restricting daily caloric intake to. . The detrimental long term effects of obesity on her health and ongoing poor compliance was also discussed with the patient.  3. Dilated cardiomyopathy ([place]) Stable, patient will continue to see cardiology for follow-up as scheduled.  4. OSA on CPAP Stable, patient should continue to wear CPAP nightly and napping.  He does report some excellent relief of symptoms since starting to wear the machine and continue as prescribed.  General Counseling: macy lingenfelter understanding of the findings of todays visit and agrees with plan of treatment. I have discussed any further diagnostic evaluation that may be needed or ordered today. We also reviewed his medications today. he has been encouraged to call the office with any questions or concerns that should arise related to todays visit.    Orders Placed This Encounter  Procedures  . POCT HgB A1C    No orders of the defined types were placed in this encounter.   Time spent: 25 Minutes   This patient was seen by Orson Gear AGNP-C in Collaboration with Dr Lavera Guise as a part of collaborative care agreement     Kendell Bane AGNP-C Internal medicine

## 2018-12-19 ENCOUNTER — Ambulatory Visit: Payer: Self-pay | Admitting: Adult Health

## 2018-12-22 DIAGNOSIS — G4733 Obstructive sleep apnea (adult) (pediatric): Secondary | ICD-10-CM | POA: Diagnosis not present

## 2019-01-20 DIAGNOSIS — G4733 Obstructive sleep apnea (adult) (pediatric): Secondary | ICD-10-CM | POA: Diagnosis not present

## 2019-02-06 ENCOUNTER — Other Ambulatory Visit: Payer: Self-pay | Admitting: Adult Health

## 2019-02-06 MED ORDER — DULAGLUTIDE 1.5 MG/0.5ML ~~LOC~~ SOAJ: SUBCUTANEOUS | 1 refills | Status: DC

## 2019-02-20 DIAGNOSIS — G4733 Obstructive sleep apnea (adult) (pediatric): Secondary | ICD-10-CM | POA: Diagnosis not present

## 2019-02-24 ENCOUNTER — Other Ambulatory Visit: Payer: Self-pay | Admitting: Adult Health

## 2019-03-12 ENCOUNTER — Ambulatory Visit: Payer: Self-pay

## 2019-03-19 ENCOUNTER — Encounter: Payer: Self-pay | Admitting: Adult Health

## 2019-03-19 ENCOUNTER — Other Ambulatory Visit: Payer: Self-pay

## 2019-03-19 ENCOUNTER — Ambulatory Visit: Payer: BC Managed Care – PPO | Admitting: Adult Health

## 2019-03-19 VITALS — BP 122/80 | HR 81 | Resp 16 | Ht 68.0 in | Wt 194.0 lb

## 2019-03-19 DIAGNOSIS — G4733 Obstructive sleep apnea (adult) (pediatric): Secondary | ICD-10-CM

## 2019-03-19 DIAGNOSIS — E1165 Type 2 diabetes mellitus with hyperglycemia: Secondary | ICD-10-CM

## 2019-03-19 DIAGNOSIS — E6609 Other obesity due to excess calories: Secondary | ICD-10-CM | POA: Diagnosis not present

## 2019-03-19 DIAGNOSIS — I420 Dilated cardiomyopathy: Secondary | ICD-10-CM | POA: Diagnosis not present

## 2019-03-19 DIAGNOSIS — Z9989 Dependence on other enabling machines and devices: Secondary | ICD-10-CM

## 2019-03-19 DIAGNOSIS — Z6830 Body mass index (BMI) 30.0-30.9, adult: Secondary | ICD-10-CM

## 2019-03-19 NOTE — Patient Instructions (Signed)
Diabetes Mellitus and Nutrition, Adult  When you have diabetes (diabetes mellitus), it is very important to have healthy eating habits because your blood sugar (glucose) levels are greatly affected by what you eat and drink. Eating healthy foods in the appropriate amounts, at about the same times every day, can help you:  · Control your blood glucose.  · Lower your risk of heart disease.  · Improve your blood pressure.  · Reach or maintain a healthy weight.  Every person with diabetes is different, and each person has different needs for a meal plan. Your health care provider may recommend that you work with a diet and nutrition specialist (dietitian) to make a meal plan that is best for you. Your meal plan may vary depending on factors such as:  · The calories you need.  · The medicines you take.  · Your weight.  · Your blood glucose, blood pressure, and cholesterol levels.  · Your activity level.  · Other health conditions you have, such as heart or kidney disease.  How do carbohydrates affect me?  Carbohydrates, also called carbs, affect your blood glucose level more than any other type of food. Eating carbs naturally raises the amount of glucose in your blood. Carb counting is a method for keeping track of how many carbs you eat. Counting carbs is important to keep your blood glucose at a healthy level, especially if you use insulin or take certain oral diabetes medicines.  It is important to know how many carbs you can safely have in each meal. This is different for every person. Your dietitian can help you calculate how many carbs you should have at each meal and for each snack.  Foods that contain carbs include:  · Bread, cereal, rice, pasta, and crackers.  · Potatoes and corn.  · Peas, beans, and lentils.  · Milk and yogurt.  · Fruit and juice.  · Desserts, such as cakes, cookies, ice cream, and candy.  How does alcohol affect me?  Alcohol can cause a sudden decrease in blood glucose (hypoglycemia),  especially if you use insulin or take certain oral diabetes medicines. Hypoglycemia can be a life-threatening condition. Symptoms of hypoglycemia (sleepiness, dizziness, and confusion) are similar to symptoms of having too much alcohol.  If your health care provider says that alcohol is safe for you, follow these guidelines:  · Limit alcohol intake to no more than 1 drink per day for nonpregnant women and 2 drinks per day for men. One drink equals 12 oz of beer, 5 oz of wine, or 1½ oz of hard liquor.  · Do not drink on an empty stomach.  · Keep yourself hydrated with water, diet soda, or unsweetened iced tea.  · Keep in mind that regular soda, juice, and other mixers may contain a lot of sugar and must be counted as carbs.  What are tips for following this plan?    Reading food labels  · Start by checking the serving size on the "Nutrition Facts" label of packaged foods and drinks. The amount of calories, carbs, fats, and other nutrients listed on the label is based on one serving of the item. Many items contain more than one serving per package.  · Check the total grams (g) of carbs in one serving. You can calculate the number of servings of carbs in one serving by dividing the total carbs by 15. For example, if a food has 30 g of total carbs, it would be equal to 2   servings of carbs.  · Check the number of grams (g) of saturated and trans fats in one serving. Choose foods that have low or no amount of these fats.  · Check the number of milligrams (mg) of salt (sodium) in one serving. Most people should limit total sodium intake to less than 2,300 mg per day.  · Always check the nutrition information of foods labeled as "low-fat" or "nonfat". These foods may be higher in added sugar or refined carbs and should be avoided.  · Talk to your dietitian to identify your daily goals for nutrients listed on the label.  Shopping  · Avoid buying canned, premade, or processed foods. These foods tend to be high in fat, sodium,  and added sugar.  · Shop around the outside edge of the grocery store. This includes fresh fruits and vegetables, bulk grains, fresh meats, and fresh dairy.  Cooking  · Use low-heat cooking methods, such as baking, instead of high-heat cooking methods like deep frying.  · Cook using healthy oils, such as olive, canola, or sunflower oil.  · Avoid cooking with butter, cream, or high-fat meats.  Meal planning  · Eat meals and snacks regularly, preferably at the same times every day. Avoid going long periods of time without eating.  · Eat foods high in fiber, such as fresh fruits, vegetables, beans, and whole grains. Talk to your dietitian about how many servings of carbs you can eat at each meal.  · Eat 4-6 ounces (oz) of lean protein each day, such as lean meat, chicken, fish, eggs, or tofu. One oz of lean protein is equal to:  ? 1 oz of meat, chicken, or fish.  ? 1 egg.  ? ¼ cup of tofu.  · Eat some foods each day that contain healthy fats, such as avocado, nuts, seeds, and fish.  Lifestyle  · Check your blood glucose regularly.  · Exercise regularly as told by your health care provider. This may include:  ? 150 minutes of moderate-intensity or vigorous-intensity exercise each week. This could be brisk walking, biking, or water aerobics.  ? Stretching and doing strength exercises, such as yoga or weightlifting, at least 2 times a week.  · Take medicines as told by your health care provider.  · Do not use any products that contain nicotine or tobacco, such as cigarettes and e-cigarettes. If you need help quitting, ask your health care provider.  · Work with a counselor or diabetes educator to identify strategies to manage stress and any emotional and social challenges.  Questions to ask a health care provider  · Do I need to meet with a diabetes educator?  · Do I need to meet with a dietitian?  · What number can I call if I have questions?  · When are the best times to check my blood glucose?  Where to find more  information:  · American Diabetes Association: diabetes.org  · Academy of Nutrition and Dietetics: www.eatright.org  · National Institute of Diabetes and Digestive and Kidney Diseases (NIH): www.niddk.nih.gov  Summary  · A healthy meal plan will help you control your blood glucose and maintain a healthy lifestyle.  · Working with a diet and nutrition specialist (dietitian) can help you make a meal plan that is best for you.  · Keep in mind that carbohydrates (carbs) and alcohol have immediate effects on your blood glucose levels. It is important to count carbs and to use alcohol carefully.  This information is not intended to   replace advice given to you by your health care provider. Make sure you discuss any questions you have with your health care provider.  Document Released: 08/03/2005 Document Revised: 06/06/2017 Document Reviewed: 12/11/2016  Elsevier Interactive Patient Education © 2019 [employer]

## 2019-03-19 NOTE — Progress Notes (Signed)
[facility] [place], [place] [address]  Internal MEDICINE  Telephone Visit  Patient Name: Joe Berry  [id number]  [id number]  Date of Service: 03/19/2019  I connected with the patient at 945 by telephone and verified the patients identity using two identifiers.   I discussed the limitations, risks, security and privacy concerns of performing an evaluation and management service by telephone and the availability of in person appointments. I also discussed with the patient that there may be a patient responsible charge related to the service.  The patient expressed understanding and agrees to proceed.    Chief Complaint  Patient presents with  . Telephone Screen  . Diabetes    BLOOD SUGAR THIS MORNING 129 ,  . Telephone Assessment    HPI  Televisit for follow up on DM. Pt reports his blood sugars have been stable.  135-248m/dl.  He denies any recent issues.  He denies any side effects of medications. He has been using his Cpap machine without fail.  He does have difficulty keeping it on all night, but otherwise feels good.      Current Medication: Outpatient Encounter Medications as of 03/19/2019  Medication Sig  . ACCU-CHEK FASTCLIX LANCETS MISC Use to test blood sugars twice daily e11.65  . aspirin EC 81 MG tablet Take 1 tablet (81 mg total) by mouth daily.  . Blood Glucose Monitoring Suppl (ONETOUCH VERIO) w/Device KIT Use kit to test blood sugars . Dx e11.65  . diphenhydrAMINE HCl (ZZZQUIL) 50 MG/30ML LIQD Take 30 mLs by mouth at bedtime as needed (sleep).  . metFORMIN (GLUCOPHAGE) 500 MG tablet Take 1 tablet (500 mg total) by mouth 3 (three) times daily with meals.  . potassium chloride (K-DUR) 10 MEQ tablet Take 1 tablet (10 mEq total) by mouth daily.  . sacubitril-valsartan (ENTRESTO) 24-26 MG Take 1 tablet by mouth 2 (two) times daily.  . furosemide (LASIX) 40 MG tablet Take 1 tablet (40 mg total) by mouth daily.  .Marland Kitchenglucose blood (ONETOUCH VERIO)  test strip Use as instructed , test blood sugars twice a day. e11.65  . metoprolol succinate (TOPROL-XL) 50 MG 24 hr tablet Take 1 tablet (50 mg total) by mouth daily. Take with or immediately following a meal.  . spironolactone (ALDACTONE) 25 MG tablet Take 1 tablet (25 mg total) by mouth daily.  . TRULICITY 1.5 MPF/7.9KWSOPN INJECT UNDER THE SKIN ONCE WEEKLY IN THE ABDOMEN, THIGH, OR UPPER ARM, ANY TIME OF DAY, WITH OR WITHOUT FOOD.   No facility-administered encounter medications on file as of 03/19/2019.     Surgical History: Past Surgical History:  Procedure Laterality Date  . LEFT HEART CATH AND CORONARY ANGIOGRAPHY Left 04/12/2018   Procedure: LEFT HEART CATH AND CORONARY ANGIOGRAPHY;  Surgeon: GMinna Merritts MD;  [facility];  Service: Cardiovascular;  Laterality: Left;    Medical History: Past Medical History:  Diagnosis Date  . Anxiety   . Cataract   . [place]   . [place] disease) ([place]   . Diabetes mellitus without complication ([place]   . Sleep apnea     Family History: Family History  Problem Relation Age of Onset  . Heart disease Mother   . Lung cancer Father   . Heart disease Maternal Grandmother   . Heart Problems Maternal Grandmother   . Heart Problems Brother   . Heart Problems Maternal Grandfather     Social History   Socioeconomic  History  . Marital status: Single    Spouse name: Not on file  . Number of children: Not on file  . Years of education: Not on file  . Highest education level: Not on file  Occupational History  . Not on file  Social Needs  . Financial resource strain: Not on file  . Food insecurity:    Worry: Not on file    Inability: Not on file  . Transportation needs:    Medical: Not on file    Non-medical: Not on file  Tobacco Use  . Smoking status: Former Smoker    Packs/day: 1.50    Years: 40.00    Pack years: 60.00    Types: Cigarettes  .  Smokeless tobacco: Never Used  . Tobacco comment: quit 2008  Substance and Sexual Activity  . Alcohol use: Yes    Frequency: Never    Comment: occasional drink  . Drug use: Never  . Sexual activity: Not on file  Lifestyle  . Physical activity:    Days per week: Not on file    Minutes per session: Not on file  . Stress: Not on file  Relationships  . Social connections:    Talks on phone: Not on file    Gets together: Not on file    Attends religious service: Not on file    Active member of club or organization: Not on file    Attends meetings of clubs or organizations: Not on file    Relationship status: Not on file  . Intimate partner violence:    Fear of current or ex partner: Not on file    Emotionally abused: Not on file    Physically abused: Not on file    Forced sexual activity: Not on file  Other Topics Concern  . Not on file  Social History Narrative  . Not on file      Review of Systems  Constitutional: Negative.  Negative for chills, fatigue and unexpected weight change.  HENT: Negative.  Negative for congestion, rhinorrhea, sneezing and sore throat.   Eyes: Negative for redness.  Respiratory: Negative.  Negative for cough, chest tightness and shortness of breath.   Cardiovascular: Negative.  Negative for chest pain and palpitations.  Gastrointestinal: Negative.  Negative for abdominal pain, constipation, diarrhea, nausea and vomiting.  Endocrine: Negative.   Genitourinary: Negative.  Negative for dysuria and frequency.  Musculoskeletal: Negative.  Negative for arthralgias, back pain, joint swelling and neck pain.  Skin: Negative.  Negative for rash.  Allergic/Immunologic: Negative.   Neurological: Negative.  Negative for tremors and numbness.  Hematological: Negative for adenopathy. Does not bruise/bleed easily.  Psychiatric/Behavioral: Negative.  Negative for behavioral problems, sleep disturbance and suicidal ideas. The patient is not nervous/anxious.      Vital Signs: BP 122/80   Pulse 81   Resp 16   Ht '5\' 8"'  (1.727 m)   Wt 194 lb (88 kg)   BMI 29.50 kg/m    Observation/Objective:  NAD noted.  Well appearing. Speaking in full sentences.    Assessment/Plan: 1. Uncontrolled type 2 diabetes mellitus with hyperglycemia (HCC) Stable, continue to  Take blood sugars as discussed. BS have been running 125-180, max recently 215.   2. Class 1 obesity due to excess calories with serious comorbidity and body mass index (BMI) of 30.0 to 30.9 in adult Obesity Counseling: Risk Assessment: An assessment of behavioral risk factors was made today and includes lack of exercise sedentary lifestyle, lack of portion control  and poor dietary habits.  Risk Modification Advice: She was counseled on portion control guidelines. Restricting daily caloric intake to. . The detrimental long term effects of obesity on her health and ongoing poor compliance was also discussed with the patient.  3. OSA on CPAP Stable, continue to use cpap as discussed.   4. Dilated cardiomyopathy ([place]) Continue to follow up with Cardiology.   General Counseling: dewarren ledbetter understanding of the findings of today's phone visit and agrees with plan of treatment. I have discussed any further diagnostic evaluation that may be needed or ordered today. We also reviewed his medications today. he has been encouraged to call the office with any questions or concerns that should arise related to todays visit.    No orders of the defined types were placed in this encounter.   No orders of the defined types were placed in this encounter.   Time spent: [place] AGNP-C Internal medicine

## 2019-03-22 DIAGNOSIS — G4733 Obstructive sleep apnea (adult) (pediatric): Secondary | ICD-10-CM | POA: Diagnosis not present

## 2019-03-25 ENCOUNTER — Other Ambulatory Visit: Payer: Self-pay | Admitting: Adult Health

## 2019-04-02 ENCOUNTER — Other Ambulatory Visit: Payer: Self-pay

## 2019-04-02 ENCOUNTER — Ambulatory Visit: Payer: BC Managed Care – PPO

## 2019-04-02 ENCOUNTER — Ambulatory Visit (INDEPENDENT_AMBULATORY_CARE_PROVIDER_SITE_OTHER): Payer: BC Managed Care – PPO

## 2019-04-02 VITALS — BP 122/80 | Ht 68.0 in | Wt 194.0 lb

## 2019-04-02 DIAGNOSIS — G4733 Obstructive sleep apnea (adult) (pediatric): Secondary | ICD-10-CM | POA: Diagnosis not present

## 2019-04-02 DIAGNOSIS — E1165 Type 2 diabetes mellitus with hyperglycemia: Secondary | ICD-10-CM | POA: Diagnosis not present

## 2019-04-02 LAB — POCT GLYCOSYLATED HEMOGLOBIN (HGB A1C): Hemoglobin A1C: 7.2 % — AB (ref 4.0–5.6)

## 2019-04-02 NOTE — Progress Notes (Signed)
Pt came for HBA1c 7.2 continue same mead as prescribed as per adam

## 2019-04-02 NOTE — Progress Notes (Signed)
95 percentile pressure 14   95th percentile leak 9.4   apnea index 1.7 /hr  apnea-hypopnea index  2.9 /hr   total days used  >4 hr 42 days  total days used <4 hr 27 days  Total compliance 69 percent  Still struggles some with mask, went over some new mask.

## 2019-04-09 ENCOUNTER — Other Ambulatory Visit: Payer: Self-pay | Admitting: Cardiovascular Disease

## 2019-04-17 ENCOUNTER — Encounter: Payer: Self-pay | Admitting: Adult Health

## 2019-04-17 ENCOUNTER — Other Ambulatory Visit: Payer: Self-pay

## 2019-04-17 ENCOUNTER — Ambulatory Visit: Payer: BC Managed Care – PPO | Admitting: Adult Health

## 2019-04-17 VITALS — BP 106/73 | HR 85 | Resp 16 | Ht 68.0 in | Wt 201.0 lb

## 2019-04-17 DIAGNOSIS — Z6830 Body mass index (BMI) 30.0-30.9, adult: Secondary | ICD-10-CM

## 2019-04-17 DIAGNOSIS — E6609 Other obesity due to excess calories: Secondary | ICD-10-CM | POA: Diagnosis not present

## 2019-04-17 DIAGNOSIS — I420 Dilated cardiomyopathy: Secondary | ICD-10-CM

## 2019-04-17 DIAGNOSIS — Z9989 Dependence on other enabling machines and devices: Secondary | ICD-10-CM | POA: Diagnosis not present

## 2019-04-17 DIAGNOSIS — G4733 Obstructive sleep apnea (adult) (pediatric): Secondary | ICD-10-CM | POA: Diagnosis not present

## 2019-04-17 NOTE — Progress Notes (Signed)
[facility] [place], [place] [address]  Pulmonary Sleep Medicine   Office Visit Note  Patient Name: Joe Berry DOB: 14-Dec-1955 MRN [id number]  Date of Service: 04/17/2019  Complaints/HPI: Pt is here follow up for OSA. Pt reports continued results with his CPAP.  He reports he wears it every night.  Some nights he wakes up because he has pulled it off in his sleep. Denies Chest pain, Shortness of breath, palpitations, headache, or blurred vision. He has had no sinus issues or hemptysis.    ROS  General: (-) fever, (-) chills, (-) night sweats, (-) weakness Skin: (-) rashes, (-) itching,. Eyes: (-) visual changes, (-) redness, (-) itching. Nose and Sinuses: (-) nasal stuffiness or itchiness, (-) postnasal drip, (-) nosebleeds, (-) sinus trouble. Mouth and Throat: (-) sore throat, (-) hoarseness. Neck: (-) swollen glands, (-) enlarged thyroid, (-) neck pain. Respiratory: - cough, (-) bloody sputum, - shortness of breath, - wheezing. Cardiovascular: - ankle swelling, (-) chest pain. Lymphatic: (-) lymph node enlargement. Neurologic: (-) numbness, (-) tingling. Psychiatric: (-) anxiety, (-) depression   Current Medication: Outpatient Encounter Medications as of 04/17/2019  Medication Sig  . ACCU-CHEK FASTCLIX LANCETS MISC Use to test blood sugars twice daily e11.65  . aspirin EC 81 MG tablet Take 1 tablet (81 mg total) by mouth daily.  . Blood Glucose Monitoring Suppl (ONETOUCH VERIO) w/Device KIT Use kit to test blood sugars . Dx e11.65  . diphenhydrAMINE HCl (ZZZQUIL) 50 MG/30ML LIQD Take 30 mLs by mouth at bedtime as needed (sleep).  Marland Kitchen glucose blood (ONETOUCH VERIO) test strip Use as instructed , test blood sugars twice a day. e11.65  . metFORMIN (GLUCOPHAGE) 500 MG tablet TAKE 1 TABLET THREE TIMES A DAY WITH MEALS  . potassium chloride (K-DUR) 10 MEQ tablet TAKE 1 TABLET DAILY  . sacubitril-valsartan (ENTRESTO) 24-26 MG Take 1 tablet by mouth 2 (two)  times daily.  Marland Kitchen spironolactone (ALDACTONE) 25 MG tablet TAKE 1 TABLET DAILY  . TRULICITY 1.5 VQ/2.5ZD SOPN INJECT UNDER THE SKIN ONCE WEEKLY IN THE ABDOMEN, THIGH, OR UPPER ARM, ANY TIME OF DAY, WITH OR WITHOUT FOOD.  . [DISCONTINUED] potassium chloride (K-DUR) 10 MEQ tablet Take 1 tablet (10 mEq total) by mouth daily.  . furosemide (LASIX) 40 MG tablet Take 1 tablet (40 mg total) by mouth daily.  . metoprolol succinate (TOPROL-XL) 50 MG 24 hr tablet Take 1 tablet (50 mg total) by mouth daily. Take with or immediately following a meal.   No facility-administered encounter medications on file as of 04/17/2019.     Surgical History: Past Surgical History:  Procedure Laterality Date  . LEFT HEART CATH AND CORONARY ANGIOGRAPHY Left 04/12/2018   Procedure: LEFT HEART CATH AND CORONARY ANGIOGRAPHY;  Surgeon: Minna Merritts, MD;  Location: [facility];  Service: Cardiovascular;  Laterality: Left;    Medical History: Past Medical History:  Diagnosis Date  . Anxiety   . Cataract   . CHF (congestive heart failure) ([place])   . COPD (chronic obstructive pulmonary disease) ([place])   . Diabetes mellitus without complication ([place])   . Sleep apnea     Family History: Family History  Problem Relation Age of Onset  . Heart disease Mother   . Lung cancer Father   . Heart disease Maternal Grandmother   . Heart Problems Maternal Grandmother   . Heart Problems Brother   . Heart Problems Maternal Grandfather     Social History: Social History   Socioeconomic  History  . Marital status: Single    Spouse name: Not on file  . Number of children: Not on file  . Years of education: Not on file  . Highest education level: Not on file  Occupational History  . Not on file  Social Needs  . Financial resource strain: Not on file  . Food insecurity:    Worry: Not on file    Inability: Not on file  . Transportation needs:    Medical: Not on file    Non-medical: Not on file  Tobacco Use   . Smoking status: Former Smoker    Packs/day: 1.50    Years: 40.00    Pack years: 60.00    Types: Cigarettes  . Smokeless tobacco: Never Used  . Tobacco comment: quit 2008  Substance and Sexual Activity  . Alcohol use: Yes    Frequency: Never    Comment: occasional drink  . Drug use: Never  . Sexual activity: Not on file  Lifestyle  . Physical activity:    Days per week: Not on file    Minutes per session: Not on file  . Stress: Not on file  Relationships  . Social connections:    Talks on phone: Not on file    Gets together: Not on file    Attends religious service: Not on file    Active member of club or organization: Not on file    Attends meetings of clubs or organizations: Not on file    Relationship status: Not on file  . Intimate partner violence:    Fear of current or ex partner: Not on file    Emotionally abused: Not on file    Physically abused: Not on file    Forced sexual activity: Not on file  Other Topics Concern  . Not on file  Social History Narrative  . Not on file    Vital Signs: Blood pressure 106/73, pulse 85, resp. rate 16, height _0  (1.727 m), weight 201 lb (91.2 kg), SpO2 95 %.  Examination: General Appearance: The patient is well-developed, well-nourished, and in no distress. Skin: Gross inspection of skin unremarkable. Head: normocephalic, no gross deformities. Eyes: no gross deformities noted. ENT: ears appear grossly normal no exudates. Neck: Supple. No thyromegaly. No LAD. Respiratory: clear bilaterlaly. Cardiovascular: Normal S1 and S2 without murmur or rub. Extremities: No cyanosis. pulses are equal. Neurologic: Alert and oriented. No involuntary movements.  LABS: Recent Results (from the past 2160 hour(s))  POCT HgB A1C     Status: Abnormal   Collection Time: 04/02/19 10:32 AM  Result Value Ref Range   Hemoglobin A1C 7.2 (A) 4.0 - 5.6 %   HbA1c POC (<> result, manual entry)     HbA1c, POC (prediabetic range)     HbA1c,  POC (controlled diabetic range)      Radiology: No results found.  No results found.  No results found.    Assessment and Plan: Patient Active Problem List   Diagnosis Date Noted  . Pulmonary HTN ([place]) 05/04/2018  . Unstable angina ([place]) 04/04/2018  . Dilated cardiomyopathy ([place]) 04/03/2018  . Coronary artery calcification seen on CAT scan 04/03/2018  . Empyema lung ([place]) 03/12/2018  . Pleural effusion 03/12/2018  . Pleural plaque without asbestos 03/12/2018  . SOB (shortness of breath) 03/12/2018  . Lung mass 03/12/2018    1. OSA on CPAP Continue to use cpap as discussed.    2. Class 1 obesity due to excess calories with serious comorbidity  and body mass index (BMI) of 30.0 to 30.9 in adult Obesity Counseling: Risk Assessment: An assessment of behavioral risk factors was made today and includes lack of exercise sedentary lifestyle, lack of portion control and poor dietary habits.  Risk Modification Advice: She was counseled on portion control guidelines. Restricting daily caloric intake to. . The detrimental long term effects of obesity on her health and ongoing poor compliance was also discussed with the patient.    3. Dilated cardiomyopathy ([place]) Stable, continue to follow up with cardiology.   General Counseling: I have discussed the findings of the evaluation and examination with Louie Casa.  I have also discussed any further diagnostic evaluation thatmay be needed or ordered today. Aloysious verbalizes understanding of the findings of todays visit. We also reviewed his medications today and discussed drug interactions and side effects including but not limited excessive drowsiness and altered mental states. We also discussed that there is always a risk not just to him but also people around him. he has been encouraged to call the office with any questions or concerns that should arise related to todays visit.    Time spent: 20 This patient was seen by Orson Gear AGNP-C in  Collaboration with Dr. Devona Konig as a part of collaborative care agreement.   I have personally obtained a history, examined the patient, evaluated laboratory and imaging results, formulated the assessment and plan and placed orders.    Allyne Gee, MD [facility] Pulmonary and Critical Care Sleep medicine

## 2019-04-22 DIAGNOSIS — G4733 Obstructive sleep apnea (adult) (pediatric): Secondary | ICD-10-CM | POA: Diagnosis not present

## 2019-05-01 ENCOUNTER — Ambulatory Visit: Payer: BLUE CROSS/BLUE SHIELD | Admitting: Adult Health

## 2019-05-22 DIAGNOSIS — G4733 Obstructive sleep apnea (adult) (pediatric): Secondary | ICD-10-CM | POA: Diagnosis not present

## 2019-06-15 ENCOUNTER — Other Ambulatory Visit: Payer: Self-pay | Admitting: Cardiovascular Disease

## 2019-06-22 DIAGNOSIS — G4733 Obstructive sleep apnea (adult) (pediatric): Secondary | ICD-10-CM | POA: Diagnosis not present

## 2019-07-07 ENCOUNTER — Encounter: Payer: Self-pay | Admitting: Adult Health

## 2019-07-07 ENCOUNTER — Ambulatory Visit (INDEPENDENT_AMBULATORY_CARE_PROVIDER_SITE_OTHER): Payer: BC Managed Care – PPO | Admitting: Adult Health

## 2019-07-07 ENCOUNTER — Other Ambulatory Visit: Payer: Self-pay

## 2019-07-07 VITALS — BP 129/82 | HR 84 | Resp 16 | Ht 68.0 in | Wt 201.0 lb

## 2019-07-07 DIAGNOSIS — E1165 Type 2 diabetes mellitus with hyperglycemia: Secondary | ICD-10-CM

## 2019-07-07 DIAGNOSIS — E6609 Other obesity due to excess calories: Secondary | ICD-10-CM

## 2019-07-07 DIAGNOSIS — J449 Chronic obstructive pulmonary disease, unspecified: Secondary | ICD-10-CM | POA: Diagnosis not present

## 2019-07-07 DIAGNOSIS — G4733 Obstructive sleep apnea (adult) (pediatric): Secondary | ICD-10-CM

## 2019-07-07 DIAGNOSIS — Z9989 Dependence on other enabling machines and devices: Secondary | ICD-10-CM

## 2019-07-07 DIAGNOSIS — Z6830 Body mass index (BMI) 30.0-30.9, adult: Secondary | ICD-10-CM

## 2019-07-07 LAB — POCT GLYCOSYLATED HEMOGLOBIN (HGB A1C): Hemoglobin A1C: 7.1 % — AB (ref 4.0–5.6)

## 2019-07-07 NOTE — Progress Notes (Signed)
[facility] [place], [place] [address]  Internal MEDICINE  Office Visit Note  Patient Name: Joe Berry  [id number]  [id number]  Date of Service: 07/07/2019  Chief Complaint  Patient presents with  . Medical Management of Chronic Issues    3 month follow up  . Diabetes  . Anxiety    HPI Pt is here for follow up on DM.  He reports overall he is doing well. He does report some continued issues with is stomach.  He reports its always after he takes his trulicity, however he reports it is manageable because he is pleased with his blood sugar. His A1C is 7.1 today.       Current Medication: Outpatient Encounter Medications as of 07/07/2019  Medication Sig  . ACCU-CHEK FASTCLIX LANCETS MISC Use to test blood sugars twice daily e11.65  . aspirin EC 81 MG tablet Take 1 tablet (81 mg total) by mouth daily.  . Blood Glucose Monitoring Suppl (ONETOUCH VERIO) w/Device KIT Use kit to test blood sugars . Dx e11.65  . diphenhydrAMINE HCl (ZZZQUIL) 50 MG/30ML LIQD Take 30 mLs by mouth at bedtime as needed (sleep).  . ENTRESTO 24-26 MG TAKE 1 TABLET TWICE A DAY  . furosemide (LASIX) 40 MG tablet TAKE 1 TABLET DAILY  . glucose blood (ONETOUCH VERIO) test strip Use as instructed , test blood sugars twice a day. e11.65  . metFORMIN (GLUCOPHAGE) 500 MG tablet TAKE 1 TABLET THREE TIMES A DAY WITH MEALS  . metoprolol succinate (TOPROL-XL) 50 MG 24 hr tablet TAKE 1 TABLET DAILY. TAKE WITH OR IMMEDIATELY FOLLOWING A MEAL  . potassium chloride (K-DUR) 10 MEQ tablet TAKE 1 TABLET DAILY  . spironolactone (ALDACTONE) 25 MG tablet TAKE 1 TABLET DAILY  . TRULICITY 1.5 ZH/0.8MV SOPN INJECT UNDER THE SKIN ONCE WEEKLY IN THE ABDOMEN, THIGH, OR UPPER ARM, ANY TIME OF DAY, WITH OR WITHOUT FOOD.   No facility-administered encounter medications on file as of 07/07/2019.     Surgical History: Past Surgical History:  Procedure Laterality Date  . LEFT HEART CATH AND CORONARY ANGIOGRAPHY  Left 04/12/2018   Procedure: LEFT HEART CATH AND CORONARY ANGIOGRAPHY;  Surgeon: Minna Merritts, MD;  Location: [facility];  Service: Cardiovascular;  Laterality: Left;    Medical History: Past Medical History:  Diagnosis Date  . Anxiety   . Cataract   . CHF (congestive heart failure) ([place])   . COPD (chronic obstructive pulmonary disease) ([place])   . Diabetes mellitus without complication ([place])   . Sleep apnea     Family History: Family History  Problem Relation Age of Onset  . Heart disease Mother   . Lung cancer Father   . Heart disease Maternal Grandmother   . Heart Problems Maternal Grandmother   . Heart Problems Brother   . Heart Problems Maternal Grandfather     Social History   Socioeconomic History  . Marital status: Single    Spouse name: Not on file  . Number of children: Not on file  . Years of education: Not on file  . Highest education level: Not on file  Occupational History  . Not on file  Social Needs  . Financial resource strain: Not on file  . Food insecurity    Worry: Not on file    Inability: Not on file  . Transportation needs    Medical: Not on file    Non-medical: Not on file  Tobacco Use  . Smoking status: Former  Smoker    Packs/day: 1.50    Years: 40.00    Pack years: 60.00    Types: Cigarettes  . Smokeless tobacco: Never Used  . Tobacco comment: quit 2008  Substance and Sexual Activity  . Alcohol use: Yes    Frequency: Never    Comment: occasional drink  . Drug use: Never  . Sexual activity: Not on file  Lifestyle  . Physical activity    Days per week: Not on file    Minutes per session: Not on file  . Stress: Not on file  Relationships  . Social Herbalist on phone: Not on file    Gets together: Not on file    Attends religious service: Not on file    Active member of club or organization: Not on file    Attends meetings of clubs or organizations: Not on file    Relationship status: Not on file  .  Intimate partner violence    Fear of current or ex partner: Not on file    Emotionally abused: Not on file    Physically abused: Not on file    Forced sexual activity: Not on file  Other Topics Concern  . Not on file  Social History Narrative  . Not on file      Review of Systems  Constitutional: Negative.  Negative for chills, fatigue and unexpected weight change.  HENT: Negative.  Negative for congestion, rhinorrhea, sneezing and sore throat.   Eyes: Negative for redness.  Respiratory: Negative.  Negative for cough, chest tightness and shortness of breath.   Cardiovascular: Negative.  Negative for chest pain and palpitations.  Gastrointestinal: Negative.  Negative for abdominal pain, constipation, diarrhea, nausea and vomiting.  Endocrine: Negative.   Genitourinary: Negative.  Negative for dysuria and frequency.  Musculoskeletal: Negative.  Negative for arthralgias, back pain, joint swelling and neck pain.  Skin: Negative.  Negative for rash.  Allergic/Immunologic: Negative.   Neurological: Negative.  Negative for tremors and numbness.  Hematological: Negative for adenopathy. Does not bruise/bleed easily.  Psychiatric/Behavioral: Negative.  Negative for behavioral problems, sleep disturbance and suicidal ideas. The patient is not nervous/anxious.     Vital Signs: BP 129/82   Pulse 84   Resp 16   Ht 5' 8" (1.727 m)   Wt 201 lb (91.2 kg)   SpO2 97%   BMI 30.56 kg/m    Physical Exam Vitals signs and nursing note reviewed.  Constitutional:      General: He is not in acute distress.    Appearance: He is well-developed. He is not diaphoretic.  HENT:     Head: Normocephalic and atraumatic.     Mouth/Throat:     Pharynx: No oropharyngeal exudate.  Eyes:     Pupils: Pupils are equal, round, and reactive to light.  Neck:     Musculoskeletal: Normal range of motion and neck supple.     Thyroid: No thyromegaly.     Vascular: No JVD.     Trachea: No tracheal deviation.   Cardiovascular:     Rate and Rhythm: Normal rate and regular rhythm.     Heart sounds: Normal heart sounds. No murmur. No friction rub. No gallop.   Pulmonary:     Effort: Pulmonary effort is normal. No respiratory distress.     Breath sounds: Normal breath sounds. No wheezing or rales.  Chest:     Chest wall: No tenderness.  Abdominal:     Palpations: Abdomen is soft.  Tenderness: There is no abdominal tenderness. There is no guarding.  Musculoskeletal: Normal range of motion.  Lymphadenopathy:     Cervical: No cervical adenopathy.  Skin:    General: Skin is warm and dry.  Neurological:     Mental Status: He is alert and oriented to person, place, and time.     Cranial Nerves: No cranial nerve deficit.  Psychiatric:        Behavior: Behavior normal.        Thought Content: Thought content normal.        Judgment: Judgment normal.    Assessment/Plan: 1. Uncontrolled type 2 diabetes mellitus with hyperglycemia (HCC) A1c continues to go down, 7.1 today.  Continue present management.  - POCT HgB A1C  2. Class 1 obesity due to excess calories with serious comorbidity and body mass index (BMI) of 30.0 to 30.9 in adult Obesity Counseling: Risk Assessment: An assessment of behavioral risk factors was made today and includes lack of exercise sedentary lifestyle, lack of portion control and poor dietary habits.  Risk Modification Advice: She was counseled on portion control guidelines. Restricting daily caloric intake to. . The detrimental long term effects of obesity on her health and ongoing poor compliance was also discussed with the patient.  3. Chronic obstructive pulmonary disease, unspecified COPD type ([place]) Stable, continue current therapy.   4. OSA on CPAP Continue using cpap as directed.   General Counseling: gonzalo waymire understanding of the findings of todays visit and agrees with plan of treatment. I have discussed any further diagnostic evaluation that may be  needed or ordered today. We also reviewed his medications today. he has been encouraged to call the office with any questions or concerns that should arise related to todays visit.    Orders Placed This Encounter  Procedures  . POCT HgB A1C    No orders of the defined types were placed in this encounter.   Time spent: 15 Minutes   This patient was seen by Orson Gear AGNP-C in Collaboration with Dr Lavera Guise as a part of collaborative care agreement     Kendell Bane AGNP-C Internal medicine

## 2019-07-08 ENCOUNTER — Other Ambulatory Visit: Payer: Self-pay | Admitting: Cardiovascular Disease

## 2019-07-29 ENCOUNTER — Other Ambulatory Visit: Payer: Self-pay | Admitting: Adult Health

## 2019-09-22 ENCOUNTER — Encounter: Payer: Self-pay | Admitting: Internal Medicine

## 2019-09-22 ENCOUNTER — Other Ambulatory Visit: Payer: Self-pay

## 2019-09-22 ENCOUNTER — Ambulatory Visit (INDEPENDENT_AMBULATORY_CARE_PROVIDER_SITE_OTHER): Payer: BC Managed Care – PPO | Admitting: Internal Medicine

## 2019-09-22 VITALS — BP 110/70 | HR 83 | Temp 97.6°F | Resp 16 | Ht 68.0 in | Wt 204.0 lb

## 2019-09-22 DIAGNOSIS — E1165 Type 2 diabetes mellitus with hyperglycemia: Secondary | ICD-10-CM | POA: Diagnosis not present

## 2019-09-22 DIAGNOSIS — Z23 Encounter for immunization: Secondary | ICD-10-CM

## 2019-09-22 DIAGNOSIS — G4733 Obstructive sleep apnea (adult) (pediatric): Secondary | ICD-10-CM

## 2019-09-22 DIAGNOSIS — I420 Dilated cardiomyopathy: Secondary | ICD-10-CM

## 2019-09-22 DIAGNOSIS — R300 Dysuria: Secondary | ICD-10-CM

## 2019-09-22 DIAGNOSIS — Z0001 Encounter for general adult medical examination with abnormal findings: Secondary | ICD-10-CM | POA: Diagnosis not present

## 2019-09-22 DIAGNOSIS — Z9989 Dependence on other enabling machines and devices: Secondary | ICD-10-CM

## 2019-09-22 DIAGNOSIS — Z125 Encounter for screening for malignant neoplasm of prostate: Secondary | ICD-10-CM

## 2019-09-22 DIAGNOSIS — R9389 Abnormal findings on diagnostic imaging of other specified body structures: Secondary | ICD-10-CM

## 2019-09-22 DIAGNOSIS — Z5320 Procedure and treatment not carried out because of patient's decision for unspecified reasons: Secondary | ICD-10-CM

## 2019-09-22 LAB — POCT GLYCOSYLATED HEMOGLOBIN (HGB A1C): Hemoglobin A1C: 7.3 % — AB (ref 4.0–5.6)

## 2019-09-22 NOTE — Progress Notes (Signed)
[facility], [place] [address]  Internal MEDICINE  Office Visit Note  Patient Name: Joe Berry  [id number]  [id number]  Date of Service: 09/24/2019  Chief Complaint  Patient presents with  . Annual Exam  . Diabetes  . Anxiety   HPI Pt is here for routine health maintenance examination. He does have multiple medical problems. He has been feeling better since his blood sugars are under better control. Trying to lose more weight. He has difficulty using his mask for CPAP machine, will like to look into it further. He is seen in the sleep clinic as well. Pt has dilated cardiomyopathy which he sees cardiology, recent echo showed improved EF ( Dr Candis Musa). He is on low dose Entersto. CT scan chest was abnormal and needed further monitoring, will order one today. No Colonoscopy on chart, does not wish to get one due to Covid, however will be ok to get Cologuard   Current Medication: Outpatient Encounter Medications as of 09/22/2019  Medication Sig  . ACCU-CHEK FASTCLIX LANCETS MISC Use to test blood sugars twice daily e11.65  . aspirin EC 81 MG tablet Take 1 tablet (81 mg total) by mouth daily.  . Blood Glucose Monitoring Suppl (ONETOUCH VERIO) w/Device KIT Use kit to test blood sugars . Dx e11.65  . diphenhydrAMINE HCl (ZZZQUIL) 50 MG/30ML LIQD Take 30 mLs by mouth at bedtime as needed (sleep).  . ENTRESTO 24-26 MG TAKE 1 TABLET TWICE A DAY  . furosemide (LASIX) 40 MG tablet TAKE 1 TABLET DAILY  . glucose blood (ONETOUCH VERIO) test strip Use as instructed , test blood sugars twice a day. e11.65  . metFORMIN (GLUCOPHAGE) 500 MG tablet TAKE 1 TABLET THREE TIMES A DAY WITH MEALS  . metoprolol succinate (TOPROL-XL) 50 MG 24 hr tablet TAKE 1 TABLET DAILY. TAKE WITH OR IMMEDIATELY FOLLOWING A MEAL  . potassium chloride (K-DUR) 10 MEQ tablet TAKE 1 TABLET DAILY  . spironolactone (ALDACTONE) 25 MG tablet TAKE 1 TABLET DAILY  . TRULICITY 1.5 MV/6.7MC SOPN INJECT  UNDER THE SKIN ONCE WEEKLY IN THE ABDOMEN, THIGH, OR UPPER ARM, ANY TIME OF DAY, WITH OR WITHOUT FOOD.   No facility-administered encounter medications on file as of 09/22/2019.     Surgical History: Past Surgical History:  Procedure Laterality Date  . LEFT HEART CATH AND CORONARY ANGIOGRAPHY Left 04/12/2018   Procedure: LEFT HEART CATH AND CORONARY ANGIOGRAPHY;  Surgeon: Minna Merritts, MD;  Location: [facility];  Service: Cardiovascular;  Laterality: Left;    Medical History: Past Medical History:  Diagnosis Date  . Anxiety   . Cataract   . CHF (congestive heart failure) ([place])   . COPD (chronic obstructive pulmonary disease) ([place])   . Diabetes mellitus without complication ([place])   . Sleep apnea     Family History: Family History  Problem Relation Age of Onset  . Heart disease Mother   . Lung cancer Father   . Heart disease Maternal Grandmother   . Heart Problems Maternal Grandmother   . Heart Problems Brother   . Heart Problems Maternal Grandfather     Review of Systems  Constitutional: Negative for chills, fatigue and unexpected weight change.  HENT: Positive for postnasal drip. Negative for congestion, rhinorrhea, sneezing and sore throat.   Eyes: Negative for redness.  Respiratory: Negative for cough, chest tightness and shortness of breath.   Cardiovascular: Negative for chest pain and palpitations.  Gastrointestinal: Negative for abdominal pain, constipation, diarrhea, nausea and  vomiting.  Genitourinary: Negative for dysuria and frequency.  Musculoskeletal: Negative for arthralgias, back pain, joint swelling and neck pain.  Skin: Negative for rash.  Neurological: Negative.  Negative for tremors and numbness.  Hematological: Negative for adenopathy. Does not bruise/bleed easily.  Psychiatric/Behavioral: Negative for behavioral problems (Depression), sleep disturbance and suicidal ideas. The patient is not nervous/anxious.      Vital Signs: BP  110/70   Pulse 83   Temp 97.6 F (36.4 C)   Resp 16   Ht _0  (1.727 m)   Wt 204 lb (92.5 kg)   SpO2 98%   BMI 31.02 kg/m    Physical Exam Constitutional:      General: He is not in acute distress.    Appearance: He is well-developed. He is not diaphoretic.  HENT:     Head: Normocephalic and atraumatic.     Mouth/Throat:     Pharynx: No oropharyngeal exudate.  Eyes:     Pupils: Pupils are equal, round, and reactive to light.  Neck:     Musculoskeletal: Normal range of motion and neck supple.     Thyroid: No thyromegaly.     Vascular: No JVD.     Trachea: No tracheal deviation.  Cardiovascular:     Rate and Rhythm: Normal rate and regular rhythm.     Heart sounds: Normal heart sounds. No murmur. No friction rub. No gallop.   Pulmonary:     Effort: Pulmonary effort is normal. No respiratory distress.     Breath sounds: No wheezing or rales.  Chest:     Chest wall: No tenderness.  Abdominal:     General: Bowel sounds are normal.     Palpations: Abdomen is soft.  Musculoskeletal: Normal range of motion.  Lymphadenopathy:     Cervical: No cervical adenopathy.  Skin:    General: Skin is warm and dry.  Neurological:     Mental Status: He is alert and oriented to person, place, and time.     Cranial Nerves: No cranial nerve deficit.  Psychiatric:        Behavior: Behavior normal.        Thought Content: Thought content normal.        Judgment: Judgment normal.    LABS: Recent Results (from the past 2160 hour(s))  POCT HgB A1C     Status: Abnormal   Collection Time: 07/07/19  9:06 AM  Result Value Ref Range   Hemoglobin A1C 7.1 (A) 4.0 - 5.6 %   HbA1c POC (<> result, manual entry)     HbA1c, POC (prediabetic range)     HbA1c, POC (controlled diabetic range)    UA/M w/rflx Culture, Routine     Status: Abnormal   Collection Time: 09/22/19  8:56 AM   Specimen: Urine   URINE  Result Value Ref Range   Specific Gravity, UA 1.022 1.005 - 1.030   pH, UA 5.0 5.0 -  7.5   Color, UA Yellow Yellow   Appearance Ur Clear Clear   Leukocytes,UA Negative Negative   Protein,UA Negative Negative/Trace   Glucose, UA 1+ (A) Negative   Ketones, UA Negative Negative   RBC, UA Negative Negative   Bilirubin, UA Negative Negative   Urobilinogen, Ur 0.2 0.2 - 1.0 mg/dL   Nitrite, UA Negative Negative   Microscopic Examination Comment     Comment: Microscopic follows if indicated.   Microscopic Examination See below:     Comment: Microscopic was indicated and was performed.   Urinalysis  Reflex Comment     Comment: This specimen will not reflex to a Urine Culture.  Microscopic Examination     Status: Abnormal   Collection Time: 09/22/19  8:56 AM   URINE  Result Value Ref Range   WBC, UA 0-5 0 - 5 /hpf   RBC 0-2 0 - 2 /hpf   Epithelial Cells (non renal) None seen 0 - 10 /hpf   Casts Present (A) None seen /lpf   Cast Type Hyaline casts N/A   Crystals Present (A) N/A   Crystal Type Calcium Oxalate N/A   Mucus, UA Present Not Estab.   Bacteria, UA None seen None seen/Few  POCT HgB A1C     Status: Abnormal   Collection Time: 09/22/19  9:11 AM  Result Value Ref Range   Hemoglobin A1C 7.3 (A) 4.0 - 5.6 %   HbA1c POC (<> result, manual entry)     HbA1c, POC (prediabetic range)     HbA1c, POC (controlled diabetic range)      Assessment/Plan: 1. Encounter for general adult medical examination with abnormal findings - All PHM is updated, pt will be ok to get FIT or cologaurd  2. Uncontrolled type 2 diabetes mellitus with hyperglycemia (HCC) - Improved glycemic control, continue metformin and Trulicity ( hg E9B 7.3 today)  3. Dilated cardiomyopathy ([place]) - Per cardiology, recent EF is improved to 35%  4. OSA on CPAP - Pt will need to beseen in sleep clinic, having problem with mask, might need a mouthpiece   5. Abnormal CT of the chest - will need follow up - CT Chest Wo Contrast; Future  6. Special screening for malignant neoplasm of prostate  - PSA  is ordered.   7. Colon cancer screening declined - Cologuard   8. Flu vaccine need - Flu Vaccine MDCK QUAD PF  9. Dysuria - UA/M w/rflx Culture, Routine - PSA - Microalbumin, urine - Microalbumin / creatinine urine ratio  General Counseling: Aaryn verbalizes understanding of the findings of todays visit and agrees with plan of treatment. I have discussed any further diagnostic evaluation that may be needed or ordered today. We also reviewed his medications today. he has been encouraged to call the office with any questions or concerns that should arise related to todays visit.  Orders Placed This Encounter  Procedures  . Microscopic Examination  . CT Chest Wo Contrast  . Flu Vaccine MDCK QUAD PF  . UA/M w/rflx Culture, Routine  . CBC with Differential/Platelet  . Lipid Panel With LDL/HDL Ratio  . TSH  . T4, free  . Comprehensive metabolic panel  . PSA  . Microalbumin, urine  . Microalbumin / creatinine urine ratio  . POCT HgB A1C     Time spent:30 [place], MD   Internal Medicine

## 2019-09-23 LAB — UA/M W/RFLX CULTURE, ROUTINE
Bilirubin, UA: NEGATIVE
Ketones, UA: NEGATIVE
Leukocytes,UA: NEGATIVE
Nitrite, UA: NEGATIVE
Protein,UA: NEGATIVE
RBC, UA: NEGATIVE
Specific Gravity, UA: 1.022 (ref 1.005–1.030)
Urobilinogen, Ur: 0.2 mg/dL (ref 0.2–1.0)
pH, UA: 5 (ref 5.0–7.5)

## 2019-09-23 LAB — MICROSCOPIC EXAMINATION
Bacteria, UA: NONE SEEN
Epithelial Cells (non renal): NONE SEEN /hpf (ref 0–10)

## 2019-09-26 ENCOUNTER — Other Ambulatory Visit: Payer: Self-pay

## 2019-09-26 ENCOUNTER — Telehealth: Payer: Self-pay

## 2019-09-26 DIAGNOSIS — R300 Dysuria: Secondary | ICD-10-CM

## 2019-09-26 DIAGNOSIS — G4733 Obstructive sleep apnea (adult) (pediatric): Secondary | ICD-10-CM

## 2019-09-26 DIAGNOSIS — R9389 Abnormal findings on diagnostic imaging of other specified body structures: Secondary | ICD-10-CM

## 2019-09-26 DIAGNOSIS — I420 Dilated cardiomyopathy: Secondary | ICD-10-CM

## 2019-09-26 DIAGNOSIS — Z0001 Encounter for general adult medical examination with abnormal findings: Secondary | ICD-10-CM

## 2019-09-26 DIAGNOSIS — Z23 Encounter for immunization: Secondary | ICD-10-CM

## 2019-09-26 DIAGNOSIS — Z125 Encounter for screening for malignant neoplasm of prostate: Secondary | ICD-10-CM

## 2019-09-26 DIAGNOSIS — Z5320 Procedure and treatment not carried out because of patient's decision for unspecified reasons: Secondary | ICD-10-CM

## 2019-09-26 DIAGNOSIS — E1165 Type 2 diabetes mellitus with hyperglycemia: Secondary | ICD-10-CM

## 2019-09-26 NOTE — Telephone Encounter (Signed)
Pt advised we send cologuard and send labs to [facility] and also faxed order for cologuard

## 2019-09-29 DIAGNOSIS — G4733 Obstructive sleep apnea (adult) (pediatric): Secondary | ICD-10-CM | POA: Diagnosis not present

## 2019-09-29 DIAGNOSIS — Z23 Encounter for immunization: Secondary | ICD-10-CM | POA: Diagnosis not present

## 2019-09-29 DIAGNOSIS — R9389 Abnormal findings on diagnostic imaging of other specified body structures: Secondary | ICD-10-CM | POA: Diagnosis not present

## 2019-09-29 DIAGNOSIS — E1165 Type 2 diabetes mellitus with hyperglycemia: Secondary | ICD-10-CM | POA: Diagnosis not present

## 2019-09-29 DIAGNOSIS — I420 Dilated cardiomyopathy: Secondary | ICD-10-CM | POA: Diagnosis not present

## 2019-09-29 DIAGNOSIS — R300 Dysuria: Secondary | ICD-10-CM | POA: Diagnosis not present

## 2019-09-29 DIAGNOSIS — Z0001 Encounter for general adult medical examination with abnormal findings: Secondary | ICD-10-CM | POA: Diagnosis not present

## 2019-09-30 LAB — CBC WITH DIFFERENTIAL/PLATELET
Basophils Absolute: 0.1 10*3/uL (ref 0.0–0.2)
Basos: 1 %
EOS (ABSOLUTE): 0.2 10*3/uL (ref 0.0–0.4)
Eos: 2 %
Hematocrit: 44.6 % (ref 37.5–51.0)
Hemoglobin: 15.6 g/dL (ref 13.0–17.7)
Immature Grans (Abs): 0 10*3/uL (ref 0.0–0.1)
Immature Granulocytes: 0 %
Lymphocytes Absolute: 1.8 10*3/uL (ref 0.7–3.1)
Lymphs: 21 %
MCH: 29.9 pg (ref 26.6–33.0)
MCHC: 35 g/dL (ref 31.5–35.7)
MCV: 85 fL (ref 79–97)
Monocytes Absolute: 0.9 10*3/uL (ref 0.1–0.9)
Monocytes: 11 %
Neutrophils Absolute: 5.6 10*3/uL (ref 1.4–7.0)
Neutrophils: 65 %
Platelets: 322 10*3/uL (ref 150–450)
RBC: 5.22 x10E6/uL (ref 4.14–5.80)
RDW: 12.3 % (ref 11.6–15.4)
WBC: 8.6 10*3/uL (ref 3.4–10.8)

## 2019-09-30 LAB — LIPID PANEL WITH LDL/HDL RATIO
Cholesterol, Total: 157 mg/dL (ref 100–199)
HDL: 39 mg/dL — ABNORMAL LOW (ref >39)
LDL Chol Calc (NIH): 92 mg/dL (ref 0–99)
LDL/HDL Ratio: 2.4 ratio (ref 0.0–3.6)
Triglycerides: 147 mg/dL (ref 0–149)
VLDL Cholesterol Cal: 26 mg/dL (ref 5–40)

## 2019-09-30 LAB — T4, FREE: Free T4: 1.23 ng/dL (ref 0.82–1.77)

## 2019-09-30 LAB — TSH: TSH: 1.66 u[IU]/mL (ref 0.450–4.500)

## 2019-10-01 ENCOUNTER — Other Ambulatory Visit: Payer: Self-pay

## 2019-10-01 ENCOUNTER — Ambulatory Visit: Payer: BC Managed Care – PPO

## 2019-10-01 DIAGNOSIS — G4733 Obstructive sleep apnea (adult) (pediatric): Secondary | ICD-10-CM

## 2019-10-01 NOTE — Progress Notes (Signed)
95 percentile pressure 13.2   95th percentile leak 3.5   apnea index 1.6 /hr  apnea-hypopnea index  3.0 /hr   total days used  >4 hr 6 days  total days used <4 hr 39 days  Total compliance 7 percent  Needs new mask, will send for new order.

## 2019-10-09 ENCOUNTER — Ambulatory Visit: Payer: BC Managed Care – PPO

## 2019-10-10 ENCOUNTER — Other Ambulatory Visit: Payer: Self-pay

## 2019-10-10 ENCOUNTER — Ambulatory Visit
Admission: RE | Admit: 2019-10-10 | Discharge: 2019-10-10 | Disposition: A | Discharge status: Home / Self Care | Payer: BC Managed Care – PPO | Source: Ambulatory Visit | Attending: Internal Medicine | Admitting: Internal Medicine

## 2019-10-10 DIAGNOSIS — G4733 Obstructive sleep apnea (adult) (pediatric): Secondary | ICD-10-CM

## 2019-10-10 DIAGNOSIS — Z9989 Dependence on other enabling machines and devices: Secondary | ICD-10-CM | POA: Diagnosis not present

## 2019-10-10 DIAGNOSIS — Z23 Encounter for immunization: Secondary | ICD-10-CM | POA: Insufficient documentation

## 2019-10-10 DIAGNOSIS — Z125 Encounter for screening for malignant neoplasm of prostate: Secondary | ICD-10-CM | POA: Insufficient documentation

## 2019-10-10 DIAGNOSIS — Z1211 Encounter for screening for malignant neoplasm of colon: Secondary | ICD-10-CM | POA: Diagnosis not present

## 2019-10-10 DIAGNOSIS — I420 Dilated cardiomyopathy: Secondary | ICD-10-CM | POA: Insufficient documentation

## 2019-10-10 DIAGNOSIS — Z5320 Procedure and treatment not carried out because of patient's decision for unspecified reasons: Secondary | ICD-10-CM | POA: Diagnosis not present

## 2019-10-10 DIAGNOSIS — R0602 Shortness of breath: Secondary | ICD-10-CM | POA: Diagnosis not present

## 2019-10-10 DIAGNOSIS — R300 Dysuria: Secondary | ICD-10-CM | POA: Diagnosis not present

## 2019-10-10 DIAGNOSIS — Z0001 Encounter for general adult medical examination with abnormal findings: Secondary | ICD-10-CM

## 2019-10-10 DIAGNOSIS — E1165 Type 2 diabetes mellitus with hyperglycemia: Secondary | ICD-10-CM | POA: Insufficient documentation

## 2019-10-10 DIAGNOSIS — R9389 Abnormal findings on diagnostic imaging of other specified body structures: Secondary | ICD-10-CM

## 2019-10-10 DIAGNOSIS — Z1212 Encounter for screening for malignant neoplasm of rectum: Secondary | ICD-10-CM | POA: Diagnosis not present

## 2019-10-11 NOTE — Progress Notes (Signed)
Discuss CT chest, all findings, will need close monitoring of all pathologies

## 2019-10-14 ENCOUNTER — Telehealth: Payer: Self-pay

## 2019-10-14 NOTE — Telephone Encounter (Signed)
Confirmed appointment with patient. klh °

## 2019-10-20 ENCOUNTER — Encounter: Payer: Self-pay | Admitting: Internal Medicine

## 2019-10-20 ENCOUNTER — Ambulatory Visit: Payer: BC Managed Care – PPO | Admitting: Internal Medicine

## 2019-10-20 ENCOUNTER — Other Ambulatory Visit: Payer: Self-pay

## 2019-10-20 VITALS — BP 138/90 | HR 90 | Resp 16 | Ht 68.0 in | Wt 203.0 lb

## 2019-10-20 DIAGNOSIS — J449 Chronic obstructive pulmonary disease, unspecified: Secondary | ICD-10-CM | POA: Diagnosis not present

## 2019-10-20 DIAGNOSIS — I5022 Chronic systolic (congestive) heart failure: Secondary | ICD-10-CM | POA: Diagnosis not present

## 2019-10-20 DIAGNOSIS — R918 Other nonspecific abnormal finding of lung field: Secondary | ICD-10-CM | POA: Diagnosis not present

## 2019-10-20 DIAGNOSIS — G4733 Obstructive sleep apnea (adult) (pediatric): Secondary | ICD-10-CM

## 2019-10-20 NOTE — Patient Instructions (Signed)

## 2019-10-20 NOTE — Progress Notes (Signed)
[facility] [place], [place] [address]  Pulmonary Sleep Medicine   Office Visit Note  Patient Name: Joe Berry DOB: 10/06/1956 MRN [id number]  Date of Service: 10/20/2019  Complaints/HPI: Patient is here for follow-up of CT scan.  He had a CT scan which shows some calcifications in the pleural area and some scarring which is old I reviewed his CT scan from 2019 does not appear to be any new changes since last year.  Patient states that he has been overall doing well not losing weight denies any chest pain denies having any cough no fevers no hemoptysis.  There was a concern of a new nodule and it was recommended that the patient get a follow-up CT in about a year which is reasonable.  Reviewed his chart he has not had a follow-up pulmonary function done in over a year.  Last echocardiogram was done over a year ago.  He does follow cardiology for his cardiac issues.  As far as his sleep is concerned he is using CPAP he does not use oxygen with the CPAP.  States that he recently had Cologuard sent also.  ROS  General: (-) fever, (-) chills, (-) night sweats, (-) weakness Skin: (-) rashes, (-) itching,. Eyes: (-) visual changes, (-) redness, (-) itching. Nose and Sinuses: (-) nasal stuffiness or itchiness Mouth and Throat: (-) sore throat, (-) hoarseness. Neck: (-) swollen glands, (-) enlarged thyroid, (-) neck pain. Respiratory: + cough, (-) bloody sputum, - shortness of breath, - wheezing. Cardiovascular: - ankle swelling, (-) chest pain. Lymphatic: (-) lymph node enlargement. Neurologic: (-) numbness, (-) tingling. Psychiatric: (-) anxiety, (-) depression   Current Medication: Outpatient Encounter Medications as of 10/20/2019  Medication Sig  . ACCU-CHEK FASTCLIX LANCETS MISC Use to test blood sugars twice daily e11.65  . aspirin EC 81 MG tablet Take 1 tablet (81 mg total) by mouth daily.  . Blood Glucose Monitoring Suppl (ONETOUCH VERIO) w/Device KIT  Use kit to test blood sugars . Dx e11.65  . diphenhydrAMINE HCl (ZZZQUIL) 50 MG/30ML LIQD Take 30 mLs by mouth at bedtime as needed (sleep).  . ENTRESTO 24-26 MG TAKE 1 TABLET TWICE A DAY  . furosemide (LASIX) 40 MG tablet TAKE 1 TABLET DAILY  . glucose blood (ONETOUCH VERIO) test strip Use as instructed , test blood sugars twice a day. e11.65  . metFORMIN (GLUCOPHAGE) 500 MG tablet TAKE 1 TABLET THREE TIMES A DAY WITH MEALS  . metoprolol succinate (TOPROL-XL) 50 MG 24 hr tablet TAKE 1 TABLET DAILY. TAKE WITH OR IMMEDIATELY FOLLOWING A MEAL  . potassium chloride (K-DUR) 10 MEQ tablet TAKE 1 TABLET DAILY  . spironolactone (ALDACTONE) 25 MG tablet TAKE 1 TABLET DAILY  . TRULICITY 1.5 UY/4.0HK SOPN INJECT UNDER THE SKIN ONCE WEEKLY IN THE ABDOMEN, THIGH, OR UPPER ARM, ANY TIME OF DAY, WITH OR WITHOUT FOOD.   No facility-administered encounter medications on file as of 10/20/2019.     Surgical History: Past Surgical History:  Procedure Laterality Date  . LEFT HEART CATH AND CORONARY ANGIOGRAPHY Left 04/12/2018   Procedure: LEFT HEART CATH AND CORONARY ANGIOGRAPHY;  Surgeon: Minna Merritts, MD;  Location: [facility];  Service: Cardiovascular;  Laterality: Left;    Medical History: Past Medical History:  Diagnosis Date  . Anxiety   . Cataract   . CHF (congestive heart failure) ([place])   . COPD (chronic obstructive pulmonary disease) ([place])   . Diabetes mellitus without complication ([place])   . Sleep  apnea     Family History: Family History  Problem Relation Age of Onset  . Heart disease Mother   . Lung cancer Father   . Heart disease Maternal Grandmother   . Heart Problems Maternal Grandmother   . Heart Problems Brother   . Heart Problems Maternal Grandfather     Social History: Social History   Socioeconomic History  . Marital status: Single    Spouse name: Not on file  . Number of children: Not on file  . Years of education: Not on file  . Highest education  level: Not on file  Occupational History  . Not on file  Social Needs  . Financial resource strain: Not on file  . Food insecurity    Worry: Not on file    Inability: Not on file  . Transportation needs    Medical: Not on file    Non-medical: Not on file  Tobacco Use  . Smoking status: Former Smoker    Packs/day: 1.50    Years: 40.00    Pack years: 60.00    Types: Cigarettes  . Smokeless tobacco: Never Used  . Tobacco comment: quit 2008  Substance and Sexual Activity  . Alcohol use: Yes    Frequency: Never    Comment: occasional drink  . Drug use: Never  . Sexual activity: Not on file  Lifestyle  . Physical activity    Days per week: Not on file    Minutes per session: Not on file  . Stress: Not on file  Relationships  . Social Herbalist on phone: Not on file    Gets together: Not on file    Attends religious service: Not on file    Active member of club or organization: Not on file    Attends meetings of clubs or organizations: Not on file    Relationship status: Not on file  . Intimate partner violence    Fear of current or ex partner: Not on file    Emotionally abused: Not on file    Physically abused: Not on file    Forced sexual activity: Not on file  Other Topics Concern  . Not on file  Social History Narrative  . Not on file    Vital Signs: Blood pressure 138/90, pulse 90, resp. rate 16, height '5\' 8"'  (1.727 m), weight 203 lb (92.1 kg), SpO2 95 %.  Examination: General Appearance: The patient is well-developed, well-nourished, and in no distress. Skin: Gross inspection of skin unremarkable. Head: normocephalic, no gross deformities. Eyes: no gross deformities noted. ENT: ears appear grossly normal no exudates. Neck: Supple. No thyromegaly. No LAD. Respiratory: few basal crackles noted. Cardiovascular: Normal S1 and S2 without murmur or rub. Extremities: No cyanosis. pulses are equal. Neurologic: Alert and oriented. No involuntary  movements.  LABS: Recent Results (from the past 2160 hour(s))  UA/M w/rflx Culture, Routine     Status: Abnormal   Collection Time: 09/22/19  8:56 AM   Specimen: Urine   URINE  Result Value Ref Range   Specific Gravity, UA 1.022 1.005 - 1.030   pH, UA 5.0 5.0 - 7.5   Color, UA Yellow Yellow   Appearance Ur Clear Clear   Leukocytes,UA Negative Negative   Protein,UA Negative Negative/Trace   Glucose, UA 1+ (A) Negative   Ketones, UA Negative Negative   RBC, UA Negative Negative   Bilirubin, UA Negative Negative   Urobilinogen, Ur 0.2 0.2 - 1.0 mg/dL   Nitrite, UA  Negative Negative   Microscopic Examination Comment     Comment: Microscopic follows if indicated.   Microscopic Examination See below:     Comment: Microscopic was indicated and was performed.   Urinalysis Reflex Comment     Comment: This specimen will not reflex to a Urine Culture.  Microscopic Examination     Status: Abnormal   Collection Time: 09/22/19  8:56 AM   URINE  Result Value Ref Range   WBC, UA 0-5 0 - 5 /hpf   RBC 0-2 0 - 2 /hpf   Epithelial Cells (non renal) None seen 0 - 10 /hpf   Casts Present (A) None seen /lpf   Cast Type Hyaline casts N/A   Crystals Present (A) N/A   Crystal Type Calcium Oxalate N/A   Mucus, UA Present Not Estab.   Bacteria, UA None seen None seen/Few  POCT HgB A1C     Status: Abnormal   Collection Time: 09/22/19  9:11 AM  Result Value Ref Range   Hemoglobin A1C 7.3 (A) 4.0 - 5.6 %   HbA1c POC (<> result, manual entry)     HbA1c, POC (prediabetic range)     HbA1c, POC (controlled diabetic range)    TSH     Status: None   Collection Time: 09/29/19 11:13 AM  Result Value Ref Range   TSH 1.660 0.450 - 4.500 uIU/mL  CBC with Differential/Platelet     Status: None   Collection Time: 09/29/19 11:13 AM  Result Value Ref Range   WBC 8.6 3.4 - 10.8 x10E3/uL   RBC 5.22 4.14 - 5.80 x10E6/uL   Hemoglobin 15.6 13.0 - 17.7 g/dL   Hematocrit 44.6 37.5 - 51.0 %   MCV 85 79 - 97 fL    MCH 29.9 26.6 - 33.0 pg   MCHC 35.0 31.5 - 35.7 g/dL   RDW 12.3 11.6 - 15.4 %   Platelets 322 150 - 450 x10E3/uL   Neutrophils 65 Not Estab. %   Lymphs 21 Not Estab. %   Monocytes 11 Not Estab. %   Eos 2 Not Estab. %   Basos 1 Not Estab. %   Neutrophils Absolute 5.6 1.4 - 7.0 x10E3/uL   Lymphocytes Absolute 1.8 0.7 - 3.1 x10E3/uL   Monocytes Absolute 0.9 0.1 - 0.9 x10E3/uL   EOS (ABSOLUTE) 0.2 0.0 - 0.4 x10E3/uL   Basophils Absolute 0.1 0.0 - 0.2 x10E3/uL   Immature Granulocytes 0 Not Estab. %   Immature Grans (Abs) 0.0 0.0 - 0.1 x10E3/uL  Lipid Panel With LDL/HDL Ratio     Status: Abnormal   Collection Time: 09/29/19 11:13 AM  Result Value Ref Range   Cholesterol, Total 157 100 - 199 mg/dL   Triglycerides 147 0 - 149 mg/dL   HDL 39 (L) >39 mg/dL   VLDL Cholesterol Cal 26 5 - 40 mg/dL   LDL Chol Calc (NIH) 92 0 - 99 mg/dL   LDL/HDL Ratio 2.4 0.0 - 3.6 ratio    Comment:                                     LDL/HDL Ratio                                             Men  Women  1/2 Avg.Risk  1.0    1.5                                   Avg.Risk  3.6    3.2                                2X Avg.Risk  6.2    5.0                                3X Avg.Risk  8.0    6.1   T4, free     Status: None   Collection Time: 09/29/19 11:13 AM  Result Value Ref Range   Free T4 1.23 0.82 - 1.77 ng/dL    Radiology: Ct Chest Wo Contrast  Result Date: 10/10/2019 CLINICAL DATA:  Shortness of breath.  Pulmonary nodule. EXAM: CT CHEST WITHOUT CONTRAST TECHNIQUE: Multidetector CT imaging of the chest was performed following the standard protocol without IV contrast. COMPARISON:  03/19/2018 FINDINGS: Cardiovascular: The heart size is normal. No substantial pericardial effusion. Coronary artery calcification is evident. Atherosclerotic calcification is noted in the wall of the thoracic aorta. Mediastinum/Nodes: No mediastinal lymphadenopathy. No evidence for gross hilar  lymphadenopathy although assessment is limited by the lack of intravenous contrast on today's study. The esophagus has normal imaging features. There is no axillary lymphadenopathy. Lungs/Pleura: Volume loss noted right hemithorax. Left apical pleuroparenchymal scarring is stable. Tiny 2 mm nodules in the left lung apex are unchanged pleural thickening noted on the right with associated right pleural calcification in the lower hemithorax. Centrilobular and paraseptal emphysema evident. Similar appearance of peripheral volume loss/scarring posterior right lower lobe. Loculated fissural fluid seen in the right hemithorax on the previous study has essentially resolved. 4 mm right lower lobe nodule on 97/3 was probably obscured by some atelectasis on the prior study. 4 mm posterior right upper lobe nodule (55/3) identified previously is unchanged in the interval. Upper Abdomen: Unremarkable. Musculoskeletal: No worrisome lytic or sclerotic osseous abnormality. IMPRESSION: 1. Stable 4 mm right upper lobe pulmonary nodule. Another 4 mm nodule seen in the posterior right lower lobe was in an area of atelectasis on the prior study. Given that this particular nodule was not clearly visualized previously, non-contrast chest CT can be considered in 12 months if patient is high-risk. This recommendation follows the consensus statement: Guidelines for Management of Incidental Pulmonary Nodules Detected on CT Images: From the Fleischner Society 2017; Radiology 2017; 284:228-243. 2. Volume loss right hemithorax associated with diffuse smooth pleural thickening and pleural calcification in the lower right chest. Given unilateral involvement, prior empyema or hemothorax would be a likely consideration. 3. Stable scarring posterior right lower lobe with interval resolution of loculated fissural fluid seen on the previous exam. 4.  Aortic Atherosclerois (ICD10-170.0) 5.  Emphysema. ([id number]-T01.9) Electronically Signed   By: Misty Stanley M.D.   On: 10/10/2019 11:15    No results found.  Ct Chest Wo Contrast  Result Date: 10/10/2019 CLINICAL DATA:  Shortness of breath.  Pulmonary nodule. EXAM: CT CHEST WITHOUT CONTRAST TECHNIQUE: Multidetector CT imaging of the chest was performed following the standard protocol without IV contrast. COMPARISON:  03/19/2018 FINDINGS: Cardiovascular: The heart size is normal. No substantial pericardial effusion. Coronary artery calcification is evident. Atherosclerotic calcification is noted in the  wall of the thoracic aorta. Mediastinum/Nodes: No mediastinal lymphadenopathy. No evidence for gross hilar lymphadenopathy although assessment is limited by the lack of intravenous contrast on today's study. The esophagus has normal imaging features. There is no axillary lymphadenopathy. Lungs/Pleura: Volume loss noted right hemithorax. Left apical pleuroparenchymal scarring is stable. Tiny 2 mm nodules in the left lung apex are unchanged pleural thickening noted on the right with associated right pleural calcification in the lower hemithorax. Centrilobular and paraseptal emphysema evident. Similar appearance of peripheral volume loss/scarring posterior right lower lobe. Loculated fissural fluid seen in the right hemithorax on the previous study has essentially resolved. 4 mm right lower lobe nodule on 97/3 was probably obscured by some atelectasis on the prior study. 4 mm posterior right upper lobe nodule (55/3) identified previously is unchanged in the interval. Upper Abdomen: Unremarkable. Musculoskeletal: No worrisome lytic or sclerotic osseous abnormality. IMPRESSION: 1. Stable 4 mm right upper lobe pulmonary nodule. Another 4 mm nodule seen in the posterior right lower lobe was in an area of atelectasis on the prior study. Given that this particular nodule was not clearly visualized previously, non-contrast chest CT can be considered in 12 months if patient is high-risk. This recommendation follows the  consensus statement: Guidelines for Management of Incidental Pulmonary Nodules Detected on CT Images: From the Fleischner Society 2017; Radiology 2017; 284:228-243. 2. Volume loss right hemithorax associated with diffuse smooth pleural thickening and pleural calcification in the lower right chest. Given unilateral involvement, prior empyema or hemothorax would be a likely consideration. 3. Stable scarring posterior right lower lobe with interval resolution of loculated fissural fluid seen on the previous exam. 4.  Aortic Atherosclerois (ICD10-170.0) 5.  Emphysema. ([id number]-J18.9) Electronically Signed   By: Misty Stanley M.D.   On: 10/10/2019 11:15      Assessment and Plan: Patient Active Problem List   Diagnosis Date Noted  . Pulmonary HTN ([place]) 05/04/2018  . Unstable angina ([place]) 04/04/2018  . Dilated cardiomyopathy ([place]) 04/03/2018  . Coronary artery calcification seen on CAT scan 04/03/2018  . Empyema lung ([place]) 03/12/2018  . Pleural effusion 03/12/2018  . Pleural plaque without asbestos 03/12/2018  . SOB (shortness of breath) 03/12/2018  . Lung mass 03/12/2018    1. Pulmonary Nodules the nodules to be appear to be stable.  There was some concern about a new nodule noted there is also stable scarring which has not changed in pleural calcifications which are not new.  The patient is going to get a follow-up CT scan in a year he is agreeable to doing that. 2. OSA on CPAP therapy which will be continued.  He is not currently on oxygen there has been some concern or question about possibility of him desaturating at nighttime but he does not want to pursue that at this time 3. Chronic Systolic Heart Failure as mentioned last echo was done last year he is followed by cardiology it did show an improvement in his ejection fraction and he clinically states that he is doing much better as far as his breathing is concerned.  Cardiac status otherwise has been stable 4. COPD last pulmonary function done  over a year and a half ago follow-up will be scheduled he wants to wait until next year to do it last FEV1 was approximately 61%.  He has not smoked since 2008 5. Murmur there was a murmur noted at the right sternal border he is going to follow-up with cardiology and he should have a follow-up echocardiogram to further investigate  this.  General Counseling: I have discussed the findings of the evaluation and examination with Louie Casa.  I have also discussed any further diagnostic evaluation thatmay be needed or ordered today. Hersel verbalizes understanding of the findings of todays visit. We also reviewed his medications today and discussed drug interactions and side effects including but not limited excessive drowsiness and altered mental states. We also discussed that there is always a risk not just to him but also people around him. he has been encouraged to call the office with any questions or concerns that should arise related to todays visit.  Orders Placed This Encounter  Procedures  . CT CHEST NODULE FOLLOW UP LOW DOSE W/O    Standing Status:   Future    Standing Expiration Date:   12/19/2020    Order Specific Question:   Preferred imaging location?    Answer:   [facility]    Order Specific Question:   Radiology Contrast Protocol - do NOT remove file path    Answer:   [email or web address]  . Pulmonary function test    Standing Status:   Future    Standing Expiration Date:   10/19/2020    Order Specific Question:   Where should this test be performed?    Answer:   [facility]     Time spent: 53mn  I have personally obtained a history, examined the patient, evaluated laboratory and imaging results, formulated the assessment and plan and placed orders.    SAllyne Gee MD FSan Luis Valley Regional Medical CenterPulmonary and Critical Care Sleep medicine

## 2019-10-21 ENCOUNTER — Telehealth: Payer: Self-pay

## 2019-10-21 NOTE — Telephone Encounter (Signed)
Patient has been advised of negative cologuard results. Joe Berry 

## 2019-10-23 ENCOUNTER — Telehealth: Payer: Self-pay

## 2019-10-23 NOTE — Telephone Encounter (Signed)
[facility] ORDER FOR HOME MEDICAL EQUIPMENT SIGNED AND PLACED IN [employer] PATIENT FOLDER. 

## 2019-11-22 NOTE — Progress Notes (Signed)
Cardiology Office Note  Date:  11/24/2019   ID:  Joe Berry, DOB 01-Oct-1956, MRN [id number]  PCP:  Kendell Bane, NP   Chief Complaint  Patient presents with  . office visit    12 mo F/U; Meds verbally reviewed with patient.    HPI:   [name] is a 64 year old gentleman with past medical history of Smoking, quit 2008 Anxiety Coronary calcifications on CT scan Emphysema/COPD Empyema OSA, started CPAP 3 months ago summer 2019 Shortness of breath, Ejection fraction 25-30% Now appears 35 to 40% in 07/2018 Cardiac cath 04/12/2018  Nonobstructive disease with severely reduced LV function EF <20%, global Who presents for follow-up of his dilated nonischemic cardiomyopathy  In general reports he has been doing well  BP high in AM at home Stress today, having to put dog down  322-025 systolic pressure range  Labs reviewed with him LDL 92 HBA1C: 7.3  No regular exercise program  has sleep apnea, tolerating CPAP  Echo 08/14/2018,  Cardiac function improved Was 25% Now  35 to 40%  Denies lower extremity edema, shortness of breath on exertion Continues to take Lasix 40 daily with potassium  EKG personally reviewed by myself on todays visit Shows normal sinus rhythm with rate 88 bpm no significant ST or T wave changes  Other past medical history reviewed right Heart cath (very difficult procedure as unable to measure pressures in the PA and wedge) RA pressures: 25 mm Hg RV pressures: 77/17/mean of 25 LVEDP: 32 mm Hg  CT scan images showing significant coronary calcification all 3 vessels  Echocardiogram edges pulled up with him in the office showing severely depressed LV function moderately dilated LV ejection fraction 25-30% dated 03/19/2018   history of sleep apnea and he is non compliant with CPAP therapy.     PMH:   has a past medical history of Anxiety, Cataract, CHF (congestive heart failure) ([place]), COPD (chronic obstructive pulmonary disease) ([place]),  Diabetes mellitus without complication ([place]), and Sleep apnea.  PSH:    Past Surgical History:  Procedure Laterality Date  . LEFT HEART CATH AND CORONARY ANGIOGRAPHY Left 04/12/2018   Procedure: LEFT HEART CATH AND CORONARY ANGIOGRAPHY;  Surgeon: Minna Merritts, MD;  Location: [facility];  Service: Cardiovascular;  Laterality: Left;    Current Outpatient Medications  Medication Sig Dispense Refill  . ACCU-CHEK FASTCLIX LANCETS MISC Use to test blood sugars twice daily e11.65 102 each 11  . aspirin EC 81 MG tablet Take 1 tablet (81 mg total) by mouth daily. 90 tablet 3  . Blood Glucose Monitoring Suppl (ONETOUCH VERIO) w/Device KIT Use kit to test blood sugars . Dx e11.65 1 kit 0  . diphenhydrAMINE HCl (ZZZQUIL) 50 MG/30ML LIQD Take 30 mLs by mouth at bedtime as needed (sleep).    . ENTRESTO 24-26 MG TAKE 1 TABLET TWICE A DAY 180 tablet 3  . furosemide (LASIX) 40 MG tablet TAKE 1 TABLET DAILY 90 tablet 3  . glucose blood (ONETOUCH VERIO) test strip Use as instructed , test blood sugars twice a day. e11.65 100 each 11  . metFORMIN (GLUCOPHAGE) 500 MG tablet TAKE 1 TABLET THREE TIMES A DAY WITH MEALS (Patient taking differently: Takes 2 tablets in AM and 1 in PM) 270 tablet 3  . metoprolol succinate (TOPROL-XL) 50 MG 24 hr tablet TAKE 1 TABLET DAILY. TAKE WITH OR IMMEDIATELY FOLLOWING A MEAL 90 tablet 3  . potassium chloride (K-DUR) 10 MEQ tablet TAKE 1 TABLET DAILY 90 tablet  3  . spironolactone (ALDACTONE) 25 MG tablet TAKE 1 TABLET DAILY 90 tablet 3  . TRULICITY 1.5 KG/2.5KY SOPN INJECT UNDER THE SKIN ONCE WEEKLY IN THE ABDOMEN, THIGH, OR UPPER ARM, ANY TIME OF DAY, WITH OR WITHOUT FOOD. 6 mL 3   No current facility-administered medications for this visit.     Allergies:   Patient has no known allergies.   Social History:  The patient  reports that he has quit smoking. His smoking use included cigarettes. He has a 60.00 pack-year smoking history. He has never used smokeless  tobacco. He reports current alcohol use. He reports that he does not use drugs.   Family History:   family history includes Heart Problems in his brother, maternal grandfather, and maternal grandmother; Heart disease in his maternal grandmother and mother; Lung cancer in his father.    Review of Systems  Constitutional: Negative.   HENT: Negative.   Respiratory: Negative.   Cardiovascular: Negative.   Gastrointestinal: Negative.   Musculoskeletal: Negative.   Neurological: Negative.   Psychiatric/Behavioral: Negative.   All other systems reviewed and are negative.   PHYSICAL EXAM: VS:  BP (!) 142/76 (BP Location: Left Arm, Patient Position: Sitting, Cuff Size: Normal)   Pulse 88   Ht '5\' 8"'  (1.727 m)   Wt 202 lb 4 oz (91.7 kg)   SpO2 95%   BMI 30.75 kg/m  , BMI Body mass index is 30.75 kg/m. Constitutional:  oriented to person, place, and time. No distress.  HENT:  Head: Grossly normal Eyes:  no discharge. No scleral icterus.  Neck: No JVD, no carotid bruits  Cardiovascular: Regular rate and rhythm, no murmurs appreciated Pulmonary/Chest: Clear to auscultation bilaterally, no wheezes or rails Abdominal: Soft.  no distension.  no tenderness.  Musculoskeletal: Normal range of motion Neurological:  normal muscle tone. Coordination normal. No atrophy Skin: Skin warm and dry Psychiatric: normal affect, pleasant   Recent Labs: 09/29/2019: Hemoglobin 15.6; Platelets 322; TSH 1.660    Lipid Panel Lab Results  Component Value Date   CHOL 157 09/29/2019   HDL 39 (L) 09/29/2019   LDLCALC 92 09/29/2019   TRIG 147 09/29/2019      Wt Readings from Last 3 Encounters:  11/24/19 202 lb 4 oz (91.7 kg)  10/20/19 203 lb (92.1 kg)  09/22/19 204 lb (92.5 kg)     ASSESSMENT AND PLAN:  Dilated cardiomyopathy (HCC) -  Stable, euvolemic Recommend we increase the Entresto up to 49/51 mg p.o. twice daily   Hyperlipidemia Start Crestor 10 mg daily  Empyema lung (HCC) Stopped  smoking many years ago CT scan with COPD,  Reports his breathing is stable  SOB (shortness of breath) Recommend exercise program with weight loss  Coronary artery calcification with stable angina CT scan with diffuse heavy calcification LAD, circumflex and RCA Coronary artery disease seen on cardiac catheterization We will start Crestor 10 mg daily, goal LDL less than 70   Total encounter time more than 25 minutes  Greater than 50% was spent in counseling and coordination of care with the patient  Disposition:   F/U 12 months     No orders of the defined types were placed in this encounter.    Signed, Esmond Plants, M.D., Ph.D. 11/24/2019  [facility] [place], [place]

## 2019-11-24 ENCOUNTER — Other Ambulatory Visit: Payer: Self-pay

## 2019-11-24 ENCOUNTER — Ambulatory Visit (INDEPENDENT_AMBULATORY_CARE_PROVIDER_SITE_OTHER): Payer: BC Managed Care – PPO | Admitting: Cardiovascular Disease

## 2019-11-24 ENCOUNTER — Encounter: Payer: Self-pay | Admitting: Cardiovascular Disease

## 2019-11-24 VITALS — BP 142/76 | HR 88 | Ht 68.0 in | Wt 202.2 lb

## 2019-11-24 DIAGNOSIS — J869 Pyothorax without fistula: Secondary | ICD-10-CM | POA: Diagnosis not present

## 2019-11-24 DIAGNOSIS — I2510 Atherosclerotic heart disease of native coronary artery without angina pectoris: Secondary | ICD-10-CM

## 2019-11-24 DIAGNOSIS — I420 Dilated cardiomyopathy: Secondary | ICD-10-CM

## 2019-11-24 DIAGNOSIS — R0602 Shortness of breath: Secondary | ICD-10-CM | POA: Diagnosis not present

## 2019-11-24 DIAGNOSIS — I272 Pulmonary hypertension, unspecified: Secondary | ICD-10-CM

## 2019-11-24 MED ORDER — ENTRESTO 49-51 MG PO TABS: 1.0000 | ORAL_TABLET | Freq: Two times a day (BID) | ORAL | 3 refills | Status: DC

## 2019-11-24 MED ORDER — ROSUVASTATIN CALCIUM 10 MG PO TABS: 10.0000 mg | ORAL_TABLET | Freq: Every day | ORAL | 3 refills | Status: DC

## 2019-11-24 NOTE — Patient Instructions (Addendum)
Medication Instructions:  Please start crestor 10 mg daily for cholesterol  Please increase the entresto up to 49/51 twice a day   If you need a refill on your cardiac medications before your next appointment, please call your pharmacy.    Lab work: No new labs needed   If you have labs (blood work) drawn today and your tests are completely normal, you will receive your results only by: Marland Kitchen MyChart Message (if you have MyChart) OR . A paper copy in the mail If you have any lab test that is abnormal or we need to change your treatment, we will call you to review the results.   Testing/Procedures: No new testing needed   Follow-Up: At [facility], you and your health needs are our priority.  As part of our continuing mission to provide you with exceptional heart care, we have created designated Provider Care Teams.  These Care Teams include your primary Cardiologist (physician) and Advanced Practice Providers (APPs -  Physician Assistants and Nurse Practitioners) who all work together to provide you with the care you need, when you need it.  . You will need a follow up appointment in 6 months   . Providers on your designated Care Team:   . Nicolasa Ducking, NP . Eula Listen, PA-C . Marisue Ivan, PA-C  Any Other Special Instructions Will Be Listed Below (If Applicable).  For educational health videos Log in to : [email or web address] Or : [email or web address], password : [email or web address]

## 2020-01-15 ENCOUNTER — Telehealth: Payer: Self-pay

## 2020-01-15 NOTE — Telephone Encounter (Signed)
Confirmed appointment on 01/20/2020 and screened for covid. [facility] 

## 2020-01-20 ENCOUNTER — Ambulatory Visit: Payer: BC Managed Care – PPO | Admitting: Adult Health

## 2020-01-20 ENCOUNTER — Other Ambulatory Visit: Payer: Self-pay

## 2020-01-20 ENCOUNTER — Encounter: Payer: Self-pay | Admitting: Adult Health

## 2020-01-20 VITALS — BP 129/78 | HR 75 | Temp 97.7°F | Resp 16 | Ht 68.0 in | Wt 203.0 lb

## 2020-01-20 DIAGNOSIS — E1165 Type 2 diabetes mellitus with hyperglycemia: Secondary | ICD-10-CM | POA: Diagnosis not present

## 2020-01-20 DIAGNOSIS — E7849 Other hyperlipidemia: Secondary | ICD-10-CM

## 2020-01-20 DIAGNOSIS — G4733 Obstructive sleep apnea (adult) (pediatric): Secondary | ICD-10-CM

## 2020-01-20 DIAGNOSIS — J449 Chronic obstructive pulmonary disease, unspecified: Secondary | ICD-10-CM

## 2020-01-20 DIAGNOSIS — Z9989 Dependence on other enabling machines and devices: Secondary | ICD-10-CM

## 2020-01-20 DIAGNOSIS — I10 Essential (primary) hypertension: Secondary | ICD-10-CM | POA: Diagnosis not present

## 2020-01-20 LAB — POCT GLYCOSYLATED HEMOGLOBIN (HGB A1C): Hemoglobin A1C: 8 % — AB (ref 4.0–5.6)

## 2020-01-20 NOTE — Progress Notes (Signed)
[facility] [place], [place] [address]  Internal MEDICINE  Office Visit Note  Patient Name: Joe Berry  [id number]  [id number]  Date of Service: 01/20/2020  Chief Complaint  Patient presents with  . Follow-up    check cholestrol  . Diabetes  . Hypertension  . Sleep Apnea    HPI  Pt is here for follow up.  He reports he has been doing well overall.  He was started on Crestor by Dr. Rockey Situ his cardiologist.  He needs a repeat Lipid panel before next visit. His Blood pressure is well controlled at this time.  He Denies Chest pain, Shortness of breath, palpitations, headache, or blurred vision. He is being treated for OSA.  He wears his cpap most every night, although he does not keep it on all night.  He needs a new mask, and has had trouble getting in touch with [facility].  His blood sugar has been increasing some, and he reports he has been snacking on bad foods for awhile.     Current Medication: Outpatient Encounter Medications as of 01/20/2020  Medication Sig  . ACCU-CHEK FASTCLIX LANCETS MISC Use to test blood sugars twice daily e11.65  . aspirin EC 81 MG tablet Take 1 tablet (81 mg total) by mouth daily.  . Blood Glucose Monitoring Suppl (ONETOUCH VERIO) w/Device KIT Use kit to test blood sugars . Dx e11.65  . diphenhydrAMINE HCl (ZZZQUIL) 50 MG/30ML LIQD Take 30 mLs by mouth at bedtime as needed (sleep).  . furosemide (LASIX) 40 MG tablet TAKE 1 TABLET DAILY  . glucose blood (ONETOUCH VERIO) test strip Use as instructed , test blood sugars twice a day. e11.65  . metFORMIN (GLUCOPHAGE) 500 MG tablet TAKE 1 TABLET THREE TIMES A DAY WITH MEALS (Patient taking differently: Takes 2 tablets in AM and 1 in PM)  . metoprolol succinate (TOPROL-XL) 50 MG 24 hr tablet TAKE 1 TABLET DAILY. TAKE WITH OR IMMEDIATELY FOLLOWING A MEAL  . potassium chloride (K-DUR) 10 MEQ tablet TAKE 1 TABLET DAILY  . rosuvastatin (CRESTOR) 10 MG tablet Take 1 tablet (10 mg  total) by mouth daily.  . sacubitril-valsartan (ENTRESTO) 49-51 MG Take 1 tablet by mouth 2 (two) times daily.  Marland Kitchen spironolactone (ALDACTONE) 25 MG tablet TAKE 1 TABLET DAILY  . TRULICITY 1.5 MY/1.1ZN SOPN INJECT UNDER THE SKIN ONCE WEEKLY IN THE ABDOMEN, THIGH, OR UPPER ARM, ANY TIME OF DAY, WITH OR WITHOUT FOOD.   No facility-administered encounter medications on file as of 01/20/2020.    Surgical History: Past Surgical History:  Procedure Laterality Date  . LEFT HEART CATH AND CORONARY ANGIOGRAPHY Left 04/12/2018   Procedure: LEFT HEART CATH AND CORONARY ANGIOGRAPHY;  Surgeon: Minna Merritts, MD;  Location: [facility];  Service: Cardiovascular;  Laterality: Left;    Medical History: Past Medical History:  Diagnosis Date  . Anxiety   . Cataract   . CHF (congestive heart failure) ([place])   . COPD (chronic obstructive pulmonary disease) ([place])   . Diabetes mellitus without complication ([place])   . Sleep apnea     Family History: Family History  Problem Relation Age of Onset  . Heart disease Mother   . Lung cancer Father   . Heart disease Maternal Grandmother   . Heart Problems Maternal Grandmother   . Heart Problems Brother   . Heart Problems Maternal Grandfather     Social History   Socioeconomic History  . Marital status: Single  Spouse name: Not on file  . Number of children: Not on file  . Years of education: Not on file  . Highest education level: Not on file  Occupational History  . Not on file  Tobacco Use  . Smoking status: Former Smoker    Packs/day: 1.50    Years: 40.00    Pack years: 60.00    Types: Cigarettes  . Smokeless tobacco: Never Used  . Tobacco comment: quit 2008  Substance and Sexual Activity  . Alcohol use: Yes    Comment: occasional drink  . Drug use: Never  . Sexual activity: Not on file  Other Topics Concern  . Not on file  Social History Narrative  . Not on file   Social Determinants of Health   Financial Resource  Strain:   . Difficulty of Paying Living Expenses: Not on file  Food Insecurity:   . Worried About Charity fundraiser in the Last Year: Not on file  . Ran Out of Food in the Last Year: Not on file  Transportation Needs:   . Lack of Transportation (Medical): Not on file  . Lack of Transportation (Non-Medical): Not on file  Physical Activity:   . Days of Exercise per Week: Not on file  . Minutes of Exercise per Session: Not on file  Stress:   . Feeling of Stress : Not on file  Social Connections:   . Frequency of Communication with Friends and Family: Not on file  . Frequency of Social Gatherings with Friends and Family: Not on file  . Attends Religious Services: Not on file  . Active Member of Clubs or Organizations: Not on file  . Attends Archivist Meetings: Not on file  . Marital Status: Not on file  Intimate Partner Violence:   . Fear of Current or Ex-Partner: Not on file  . Emotionally Abused: Not on file  . Physically Abused: Not on file  . Sexually Abused: Not on file      Review of Systems  Constitutional: Negative.  Negative for chills, fatigue and unexpected weight change.  HENT: Negative.  Negative for congestion, rhinorrhea, sneezing and sore throat.   Eyes: Negative for redness.  Respiratory: Negative.  Negative for cough, chest tightness and shortness of breath.   Cardiovascular: Negative.  Negative for chest pain and palpitations.  Gastrointestinal: Negative.  Negative for abdominal pain, constipation, diarrhea, nausea and vomiting.  Endocrine: Negative.   Genitourinary: Negative.  Negative for dysuria and frequency.  Musculoskeletal: Negative.  Negative for arthralgias, back pain, joint swelling and neck pain.  Skin: Negative.  Negative for rash.  Allergic/Immunologic: Negative.   Neurological: Negative.  Negative for tremors and numbness.  Hematological: Negative for adenopathy. Does not bruise/bleed easily.  Psychiatric/Behavioral: Negative.   Negative for behavioral problems, sleep disturbance and suicidal ideas. The patient is not nervous/anxious.     Vital Signs: BP 129/78   Pulse 75   Temp 97.7 F (36.5 C)   Resp 16   Ht _0  (1.727 m)   Wt 203 lb (92.1 kg)   SpO2 95%   BMI 30.87 kg/m    Physical Exam Vitals and nursing note reviewed.  Constitutional:      General: He is not in acute distress.    Appearance: He is well-developed. He is not diaphoretic.  HENT:     Head: Normocephalic and atraumatic.     Mouth/Throat:     Pharynx: No oropharyngeal exudate.  Eyes:     Pupils:  Pupils are equal, round, and reactive to light.  Neck:     Thyroid: No thyromegaly.     Vascular: No JVD.     Trachea: No tracheal deviation.  Cardiovascular:     Rate and Rhythm: Normal rate and regular rhythm.     Heart sounds: Normal heart sounds. No murmur. No friction rub. No gallop.   Pulmonary:     Effort: Pulmonary effort is normal. No respiratory distress.     Breath sounds: Normal breath sounds. No wheezing or rales.  Chest:     Chest wall: No tenderness.  Abdominal:     Palpations: Abdomen is soft.     Tenderness: There is no abdominal tenderness. There is no guarding.  Musculoskeletal:        General: Normal range of motion.     Cervical back: Normal range of motion and neck supple.  Lymphadenopathy:     Cervical: No cervical adenopathy.  Skin:    General: Skin is warm and dry.  Neurological:     Mental Status: He is alert and oriented to person, place, and time.     Cranial Nerves: No cranial nerve deficit.  Psychiatric:        Behavior: Behavior normal.        Thought Content: Thought content normal.        Judgment: Judgment normal.    Assessment/Plan: 1. Uncontrolled type 2 diabetes mellitus with hyperglycemia (HCC) A1C is 8.0 today, discussed ongoing dietary compliance, and exercise.  Pt verbalized understanding, declined to change medications at this time.  - POCT HgB A1C  2. Essential  hypertension Stable, continue present therapy.   3. Chronic obstructive pulmonary disease, unspecified COPD type ([place]) Good symptom management.   4. OSA on CPAP Encouraged continued compliance with cpap,   5. Other hyperlipidemia Have lipid panel checked before next visit.  - Lipid Panel With LDL/HDL Ratio.   General Counseling: liston thum understanding of the findings of todays visit and agrees with plan of treatment. I have discussed any further diagnostic evaluation that may be needed or ordered today. We also reviewed his medications today. he has been encouraged to call the office with any questions or concerns that should arise related to todays visit.    Orders Placed This Encounter  Procedures  . POCT HgB A1C    No orders of the defined types were placed in this encounter.   Time spent: 25 Minutes   This patient was seen by Orson Gear AGNP-C in Collaboration with Dr Lavera Guise as a part of collaborative care agreement     Kendell Bane AGNP-C Internal medicine

## 2020-02-20 DIAGNOSIS — U071 COVID-19: Secondary | ICD-10-CM | POA: Diagnosis not present

## 2020-02-20 DIAGNOSIS — Z20822 Contact with and (suspected) exposure to covid-19: Secondary | ICD-10-CM | POA: Diagnosis not present

## 2020-02-24 ENCOUNTER — Ambulatory Visit: Payer: BC Managed Care – PPO | Admitting: Adult Health

## 2020-02-24 ENCOUNTER — Encounter: Payer: Self-pay | Admitting: Adult Health

## 2020-02-24 VITALS — BP 102/79 | HR 90 | Temp 98.5°F | Ht 68.0 in | Wt 201.0 lb

## 2020-02-24 DIAGNOSIS — G4733 Obstructive sleep apnea (adult) (pediatric): Secondary | ICD-10-CM

## 2020-02-24 DIAGNOSIS — U071 COVID-19: Secondary | ICD-10-CM | POA: Diagnosis not present

## 2020-02-24 DIAGNOSIS — I10 Essential (primary) hypertension: Secondary | ICD-10-CM | POA: Diagnosis not present

## 2020-02-24 DIAGNOSIS — R05 Cough: Secondary | ICD-10-CM | POA: Diagnosis not present

## 2020-02-24 DIAGNOSIS — Z9989 Dependence on other enabling machines and devices: Secondary | ICD-10-CM

## 2020-02-24 DIAGNOSIS — R059 Cough, unspecified: Secondary | ICD-10-CM

## 2020-02-24 MED ORDER — AZITHROMYCIN 250 MG PO TABS: ORAL_TABLET | ORAL | 0 refills | Status: DC

## 2020-02-24 NOTE — Progress Notes (Signed)
[facility] [place], [place] [address]  Internal MEDICINE  Telephone Visit  Patient Name: Joe Berry  [id number]  [id number]  Date of Service: 02/24/2020  I connected with the patient at 1041by video and verified the patients identity using two identifiers.   I discussed the limitations, risks, security and privacy concerns of performing an evaluation and management service by telephone and the availability of in person appointments. I also discussed with the patient that there may be a patient responsible charge related to the service.  The patient expressed understanding and agrees to proceed.    Chief Complaint  Patient presents with  . Telephone Screen    last thursday tested positive for covid   . Telephone Assessment  . Cough  . Fatigue  . Sinusitis  . Nasal Congestion    HPI  PT seen via video.  He reports testing positive for covid last Thursday. The day before he was having headache, and feeling bad.  His girlfriend tested positive also. He continues to have fatigue, congestion, and fever.  He is taking tylenol which is helping intermittently with his aches and pains, and the fever. He does feel like he is better today, than yesterday.    Current Medication: Outpatient Encounter Medications as of 02/24/2020  Medication Sig  . ACCU-CHEK FASTCLIX LANCETS MISC Use to test blood sugars twice daily e11.65  . aspirin EC 81 MG tablet Take 1 tablet (81 mg total) by mouth daily.  . Blood Glucose Monitoring Suppl (ONETOUCH VERIO) w/Device KIT Use kit to test blood sugars . Dx e11.65  . diphenhydrAMINE HCl (ZZZQUIL) 50 MG/30ML LIQD Take 30 mLs by mouth at bedtime as needed (sleep).  . furosemide (LASIX) 40 MG tablet TAKE 1 TABLET DAILY  . glucose blood (ONETOUCH VERIO) test strip Use as instructed , test blood sugars twice a day. e11.65  . metFORMIN (GLUCOPHAGE) 500 MG tablet TAKE 1 TABLET THREE TIMES A DAY WITH MEALS (Patient taking differently: Takes 2  tablets in AM and 1 in PM)  . metoprolol succinate (TOPROL-XL) 50 MG 24 hr tablet TAKE 1 TABLET DAILY. TAKE WITH OR IMMEDIATELY FOLLOWING A MEAL  . potassium chloride (K-DUR) 10 MEQ tablet TAKE 1 TABLET DAILY  . sacubitril-valsartan (ENTRESTO) 49-51 MG Take 1 tablet by mouth 2 (two) times daily.  Marland Kitchen spironolactone (ALDACTONE) 25 MG tablet TAKE 1 TABLET DAILY  . TRULICITY 1.5 OE/3.2ZY SOPN INJECT UNDER THE SKIN ONCE WEEKLY IN THE ABDOMEN, THIGH, OR UPPER ARM, ANY TIME OF DAY, WITH OR WITHOUT FOOD.  Marland Kitchen azithromycin (ZITHROMAX) 250 MG tablet Take as directed  . rosuvastatin (CRESTOR) 10 MG tablet Take 1 tablet (10 mg total) by mouth daily.   No facility-administered encounter medications on file as of 02/24/2020.    Surgical History: Past Surgical History:  Procedure Laterality Date  . LEFT HEART CATH AND CORONARY ANGIOGRAPHY Left 04/12/2018   Procedure: LEFT HEART CATH AND CORONARY ANGIOGRAPHY;  Surgeon: Minna Merritts, MD;  Location: [facility];  Service: Cardiovascular;  Laterality: Left;    Medical History: Past Medical History:  Diagnosis Date  . Anxiety   . Cataract   . CHF (congestive heart failure) ([place])   . COPD (chronic obstructive pulmonary disease) ([place])   . Diabetes mellitus without complication ([place])   . Sleep apnea     Family History: Family History  Problem Relation Age of Onset  . Heart disease Mother   . Lung cancer Father   . Heart disease  Maternal Grandmother   . Heart Problems Maternal Grandmother   . Heart Problems Brother   . Heart Problems Maternal Grandfather     Social History   Socioeconomic History  . Marital status: Single    Spouse name: Not on file  . Number of children: Not on file  . Years of education: Not on file  . Highest education level: Not on file  Occupational History  . Not on file  Tobacco Use  . Smoking status: Former Smoker    Packs/day: 1.50    Years: 40.00    Pack years: 60.00    Types: Cigarettes  .  Smokeless tobacco: Never Used  . Tobacco comment: quit 2008  Substance and Sexual Activity  . Alcohol use: Yes    Comment: occasional drink  . Drug use: Never  . Sexual activity: Not on file  Other Topics Concern  . Not on file  Social History Narrative  . Not on file   Social Determinants of Health   Financial Resource Strain:   . Difficulty of Paying Living Expenses:   Food Insecurity:   . Worried About Charity fundraiser in the Last Year:   . Arboriculturist in the Last Year:   Transportation Needs:   . Film/video editor (Medical):   Marland Kitchen Lack of Transportation (Non-Medical):   Physical Activity:   . Days of Exercise per Week:   . Minutes of Exercise per Session:   Stress:   . Feeling of Stress :   Social Connections:   . Frequency of Communication with Friends and Family:   . Frequency of Social Gatherings with Friends and Family:   . Attends Religious Services:   . Active Member of Clubs or Organizations:   . Attends Archivist Meetings:   Marland Kitchen Marital Status:   Intimate Partner Violence:   . Fear of Current or Ex-Partner:   . Emotionally Abused:   Marland Kitchen Physically Abused:   . Sexually Abused:       Review of Systems  Constitutional: Positive for chills and fever. Negative for fatigue and unexpected weight change.  HENT: Negative.  Negative for congestion, rhinorrhea, sneezing and sore throat.   Eyes: Negative for redness.  Respiratory: Positive for cough. Negative for chest tightness and shortness of breath.   Cardiovascular: Negative.  Negative for chest pain and palpitations.  Gastrointestinal: Negative.  Negative for abdominal pain, constipation, diarrhea, nausea and vomiting.  Endocrine: Negative.   Genitourinary: Negative.  Negative for dysuria and frequency.  Musculoskeletal: Negative.  Negative for arthralgias, back pain, joint swelling and neck pain.  Skin: Negative.  Negative for rash.  Allergic/Immunologic: Negative.   Neurological:  Negative.  Negative for tremors and numbness.  Hematological: Negative for adenopathy. Does not bruise/bleed easily.  Psychiatric/Behavioral: Negative.  Negative for behavioral problems, sleep disturbance and suicidal ideas. The patient is not nervous/anxious.     Vital Signs: BP 102/79   Pulse 90   Temp 98.5 F (36.9 C)   Ht '5\' 8"'  (1.727 m)   Wt 201 lb (91.2 kg)   BMI 30.56 kg/m    Observation/Objective:  Well appearing, NAD noted.   Assessment/Plan: 1. COVID-19 Advised patient to take entire course of antibiotics as prescribed with food. Pt should return to clinic in 7-10 days if symptoms fail to improve or new symptoms develop.  - azithromycin (ZITHROMAX) 250 MG tablet; Take as directed  Dispense: 6 tablet; Refill: 0  2. Cough Continue to treat - azithromycin (  ZITHROMAX) 250 MG tablet; Take as directed  Dispense: 6 tablet; Refill: 0  3. OSA on CPAP Continued compliance with cpap is encouraged.     4. Essential hypertension Stable, continue current medications.   General Counseling: markeise mathews understanding of the findings of today's phone visit and agrees with plan of treatment. I have discussed any further diagnostic evaluation that may be needed or ordered today. We also reviewed his medications today. he has been encouraged to call the office with any questions or concerns that should arise related to todays visit.    No orders of the defined types were placed in this encounter.   Meds ordered this encounter  Medications  . azithromycin (ZITHROMAX) 250 MG tablet    Sig: Take as directed    Dispense:  6 tablet    Refill:  0    Time spent: [place] AGNP-C Internal medicine

## 2020-02-27 ENCOUNTER — Telehealth: Payer: Self-pay

## 2020-02-27 NOTE — Telephone Encounter (Signed)
Pt called stating that his coughing has worsened,but has no fever. Pt states he has been taking mucinex and this has not helped pt with his cough. Per adam recommended that pt take otc cough syrup to ease cough because mucinex is going to cause him to cough up stuff. I advised pt to give [place] a call back if the otc cough syrup did not work.

## 2020-03-20 ENCOUNTER — Other Ambulatory Visit: Payer: Self-pay | Admitting: Adult Health

## 2020-03-31 ENCOUNTER — Ambulatory Visit: Payer: BC Managed Care – PPO

## 2020-03-31 ENCOUNTER — Other Ambulatory Visit: Payer: Self-pay

## 2020-03-31 DIAGNOSIS — G4733 Obstructive sleep apnea (adult) (pediatric): Secondary | ICD-10-CM

## 2020-03-31 NOTE — Progress Notes (Signed)
95 percentile pressure 12   95th percentile leak 0.3   apnea index 1.0 /hr  apnea-hypopnea index  2.0 /hr   total days used  >4 hr 0 days  total days used <4 hr 67 days  Total compliance 0 percent  He trying to get back on cpap but needs new supplies. Will put in for new supplies with mask fit

## 2020-04-01 DIAGNOSIS — E7849 Other hyperlipidemia: Secondary | ICD-10-CM | POA: Diagnosis not present

## 2020-04-02 LAB — LIPID PANEL WITH LDL/HDL RATIO
Cholesterol, Total: 99 mg/dL — ABNORMAL LOW (ref 100–199)
HDL: 38 mg/dL — ABNORMAL LOW (ref >39)
LDL Chol Calc (NIH): 37 mg/dL (ref 0–99)
LDL/HDL Ratio: 1 ratio (ref 0.0–3.6)
Triglycerides: 134 mg/dL (ref 0–149)
VLDL Cholesterol Cal: 24 mg/dL (ref 5–40)

## 2020-04-14 ENCOUNTER — Ambulatory Visit: Payer: BC Managed Care – PPO | Admitting: Internal Medicine

## 2020-04-16 ENCOUNTER — Telehealth: Payer: Self-pay

## 2020-04-16 NOTE — Telephone Encounter (Signed)
Confirmed appointment on 04/21/2020 and screened for covid. kl[facility] 

## 2020-04-21 ENCOUNTER — Other Ambulatory Visit: Payer: Self-pay

## 2020-04-21 ENCOUNTER — Ambulatory Visit: Payer: BC Managed Care – PPO | Admitting: Adult Health

## 2020-04-21 VITALS — BP 115/71 | HR 80 | Temp 97.3°F | Resp 16 | Ht 68.0 in | Wt 208.0 lb

## 2020-04-21 DIAGNOSIS — E7849 Other hyperlipidemia: Secondary | ICD-10-CM

## 2020-04-21 DIAGNOSIS — Z9989 Dependence on other enabling machines and devices: Secondary | ICD-10-CM

## 2020-04-21 DIAGNOSIS — I10 Essential (primary) hypertension: Secondary | ICD-10-CM | POA: Diagnosis not present

## 2020-04-21 DIAGNOSIS — G4733 Obstructive sleep apnea (adult) (pediatric): Secondary | ICD-10-CM | POA: Diagnosis not present

## 2020-04-21 DIAGNOSIS — E1165 Type 2 diabetes mellitus with hyperglycemia: Secondary | ICD-10-CM

## 2020-04-21 LAB — POCT GLYCOSYLATED HEMOGLOBIN (HGB A1C): Hemoglobin A1C: 8.1 % — AB (ref 4.0–5.6)

## 2020-04-21 MED ORDER — GLIMEPIRIDE 2 MG PO TABS: 2.0000 mg | ORAL_TABLET | Freq: Every day | ORAL | 0 refills | Status: DC

## 2020-04-21 NOTE — Progress Notes (Signed)
[facility] [place], [place] [address]  Internal MEDICINE  Office Visit Note  Patient Name: Joe Berry  [id number]  [id number]  Date of Service: 04/21/2020  Chief Complaint  Patient presents with  . Follow-up  . Diabetes    HPI  Pt is here for follow up on DM. His A1C is 8.1.  He reports his diet has been going ok.  He has eaten some sweets but nothing consistent. He is walking some. He currently takes metformin 1500 mg daily. He is also taking Trulicity 2.8JG weekly. His blood sugars have been his normal around 130-160m/dl. He is starting to walk more, now that his work schedule has changed.       Current Medication: Outpatient Encounter Medications as of 04/21/2020  Medication Sig  . ACCU-CHEK FASTCLIX LANCETS MISC Use to test blood sugars twice daily e11.65  . aspirin EC 81 MG tablet Take 1 tablet (81 mg total) by mouth daily.  .Marland Kitchenazithromycin (ZITHROMAX) 250 MG tablet Take as directed  . Blood Glucose Monitoring Suppl (ONETOUCH VERIO) w/Device KIT Use kit to test blood sugars . Dx e11.65  . diphenhydrAMINE HCl (ZZZQUIL) 50 MG/30ML LIQD Take 30 mLs by mouth at bedtime as needed (sleep).  . furosemide (LASIX) 40 MG tablet TAKE 1 TABLET DAILY  . glucose blood (ONETOUCH VERIO) test strip Use as instructed , test blood sugars twice a day. e11.65  . metFORMIN (GLUCOPHAGE) 500 MG tablet TAKE 1 TABLET THREE TIMES A DAY WITH MEALS  . metoprolol succinate (TOPROL-XL) 50 MG 24 hr tablet TAKE 1 TABLET DAILY. TAKE WITH OR IMMEDIATELY FOLLOWING A MEAL  . potassium chloride (K-DUR) 10 MEQ tablet TAKE 1 TABLET DAILY  . sacubitril-valsartan (ENTRESTO) 49-51 MG Take 1 tablet by mouth 2 (two) times daily.  .Marland Kitchenspironolactone (ALDACTONE) 25 MG tablet TAKE 1 TABLET DAILY  . TRULICITY 1.5 MOT/1.5BWSOPN INJECT UNDER THE SKIN ONCE WEEKLY IN THE ABDOMEN, THIGH, OR UPPER ARM, ANY TIME OF DAY, WITH OR WITHOUT FOOD.  .Marland Kitchenglimepiride (AMARYL) 2 MG tablet Take 1 tablet (2 mg total) by  mouth daily before breakfast.  . rosuvastatin (CRESTOR) 10 MG tablet Take 1 tablet (10 mg total) by mouth daily.   No facility-administered encounter medications on file as of 04/21/2020.    Surgical History: Past Surgical History:  Procedure Laterality Date  . LEFT HEART CATH AND CORONARY ANGIOGRAPHY Left 04/12/2018   Procedure: LEFT HEART CATH AND CORONARY ANGIOGRAPHY;  Surgeon: GMinna Merritts MD;  [facility];  Service: Cardiovascular;  Laterality: Left;    Medical History: Past Medical History:  Diagnosis Date  . Anxiety   . Cataract   . CHF (congestive heart failure) ([place]   . COPD (chronic obstructive pulmonary disease) ([place]   . Diabetes mellitus without complication ([place]   . Sleep apnea     Family History: Family History  Problem Relation Age of Onset  . Heart disease Mother   . Lung cancer Father   . Heart disease Maternal Grandmother   . Heart Problems Maternal Grandmother   . Heart Problems Brother   . Heart Problems Maternal Grandfather     Social History   Socioeconomic History  . Marital status: Single    Spouse name: Not on file  . Number of children: Not on file  . Years of education: Not on file  . Highest education level: Not on file  Occupational History  . Not on file  Tobacco Use  .  Smoking status: Former Smoker    Packs/day: 1.50    Years: 40.00    Pack years: 60.00    Types: Cigarettes  . Smokeless tobacco: Never Used  . Tobacco comment: quit 2008  Substance and Sexual Activity  . Alcohol use: Yes    Comment: occasional drink  . Drug use: Never  . Sexual activity: Not on file  Other Topics Concern  . Not on file  Social History Narrative  . Not on file   Social Determinants of Health   Financial Resource Strain:   . Difficulty of Paying Living Expenses:   Food Insecurity:   . Worried About Charity fundraiser in the Last Year:   . Arboriculturist in the Last Year:   Transportation Needs:   . Lexicographer (Medical):   Marland Kitchen Lack of Transportation (Non-Medical):   Physical Activity:   . Days of Exercise per Week:   . Minutes of Exercise per Session:   Stress:   . Feeling of Stress :   Social Connections:   . Frequency of Communication with Friends and Family:   . Frequency of Social Gatherings with Friends and Family:   . Attends Religious Services:   . Active Member of Clubs or Organizations:   . Attends Archivist Meetings:   Marland Kitchen Marital Status:   Intimate Partner Violence:   . Fear of Current or Ex-Partner:   . Emotionally Abused:   Marland Kitchen Physically Abused:   . Sexually Abused:       Review of Systems  Constitutional: Negative.  Negative for chills, fatigue and unexpected weight change.  HENT: Negative.  Negative for congestion, rhinorrhea, sneezing and sore throat.   Eyes: Negative for redness.  Respiratory: Negative.  Negative for cough, chest tightness and shortness of breath.   Cardiovascular: Negative.  Negative for chest pain and palpitations.  Gastrointestinal: Negative.  Negative for abdominal pain, constipation, diarrhea, nausea and vomiting.  Endocrine: Negative.   Genitourinary: Negative.  Negative for dysuria and frequency.  Musculoskeletal: Negative.  Negative for arthralgias, back pain, joint swelling and neck pain.  Skin: Negative.  Negative for rash.  Allergic/Immunologic: Negative.   Neurological: Negative.  Negative for tremors and numbness.  Hematological: Negative for adenopathy. Does not bruise/bleed easily.  Psychiatric/Behavioral: Negative.  Negative for behavioral problems, sleep disturbance and suicidal ideas. The patient is not nervous/anxious.     Vital Signs: BP 115/71   Pulse 80   Temp (!) 97.3 F (36.3 C)   Resp 16   Ht '5\' 8"'  (1.727 m)   Wt 208 lb (94.3 kg)   SpO2 97%   BMI 31.63 kg/m    Physical Exam Vitals and nursing note reviewed.  Constitutional:      General: He is not in acute distress.    Appearance: He is  well-developed. He is not diaphoretic.  HENT:     Head: Normocephalic and atraumatic.     Mouth/Throat:     Pharynx: No oropharyngeal exudate.  Eyes:     Pupils: Pupils are equal, round, and reactive to light.  Neck:     Thyroid: No thyromegaly.     Vascular: No JVD.     Trachea: No tracheal deviation.  Cardiovascular:     Rate and Rhythm: Normal rate and regular rhythm.     Heart sounds: Normal heart sounds. No murmur. No friction rub. No gallop.   Pulmonary:     Effort: Pulmonary effort is normal. No respiratory distress.  Breath sounds: Normal breath sounds. No wheezing or rales.  Chest:     Chest wall: No tenderness.  Abdominal:     Palpations: Abdomen is soft.     Tenderness: There is no abdominal tenderness. There is no guarding.  Musculoskeletal:        General: Normal range of motion.     Cervical back: Normal range of motion and neck supple.  Lymphadenopathy:     Cervical: No cervical adenopathy.  Skin:    General: Skin is warm and dry.  Neurological:     Mental Status: He is alert and oriented to person, place, and time.     Cranial Nerves: No cranial nerve deficit.  Psychiatric:        Behavior: Behavior normal.        Thought Content: Thought content normal.        Judgment: Judgment normal.    Assessment/Plan: 1. Uncontrolled type 2 diabetes mellitus with hyperglycemia (HCC) Reduce metformin to 578m po BID.  Start Amaryl 2 mg daily.  - POCT HgB A1C  2. Essential hypertension Controlled, continue bp.   3. OSA on CPAP Continue to use cpap.  4. Other hyperlipidemia Stable, continue to take crestor.   General Counseling: Rgerrell tabetunderstanding of the findings of todays visit and agrees with plan of treatment. I have discussed any further diagnostic evaluation that may be needed or ordered today. We also reviewed his medications today. he has been encouraged to call the office with any questions or concerns that should arise related to todays  visit.    Orders Placed This Encounter  Procedures  . POCT HgB A1C    Meds ordered this encounter  Medications  . glimepiride (AMARYL) 2 MG tablet    Sig: Take 1 tablet (2 mg total) by mouth daily before breakfast.    Dispense:  90 tablet    Refill:  0    Time spent: 30 Minutes   This patient was seen by AOrson GearAGNP-C in Collaboration with Dr FLavera Guiseas a part of collaborative care agreement     AKendell BaneAGNP-C Internal medicine

## 2020-04-25 ENCOUNTER — Other Ambulatory Visit: Payer: Self-pay | Admitting: Cardiovascular Disease

## 2020-05-18 ENCOUNTER — Other Ambulatory Visit: Payer: Self-pay | Admitting: Cardiovascular Disease

## 2020-06-01 IMAGING — CT CT CHEST W/O CM
1 series · 14 of 34 positions shown, 18 images · non-contrast
Comparison: 03/19/2018

CLINICAL DATA: Shortness of breath.  Pulmonary nodule.

EXAM:
CT CHEST WITHOUT CONTRAST
TECHNIQUE: Multidetector CT imaging of the chest was performed following the
standard protocol without IV contrast.

[Series 2: thorax · axial · 0.74mm/px · z∈[-339,-75]mm · 14 of 156 slices shown, 18 images]
[im 12/156  mediastinal]
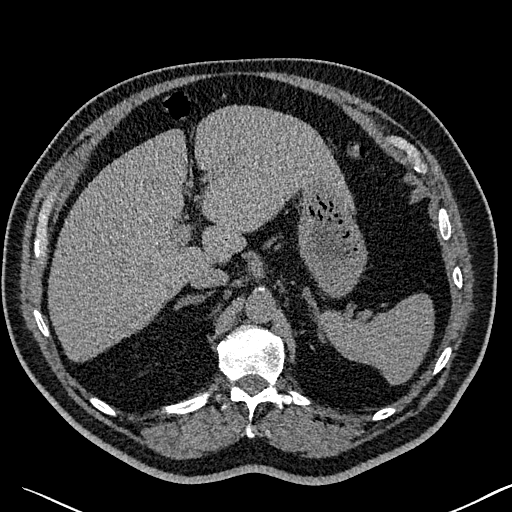
[im 12/156  lung]
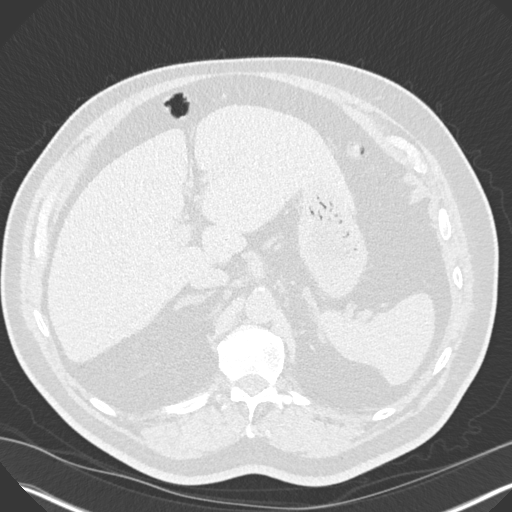
[im 23/156  lung]
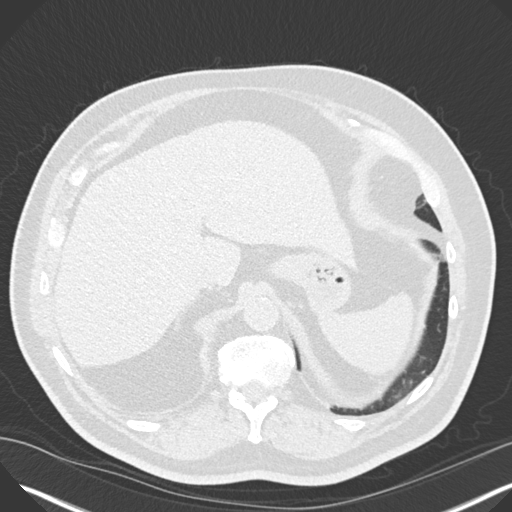
[im 32/156  lung]
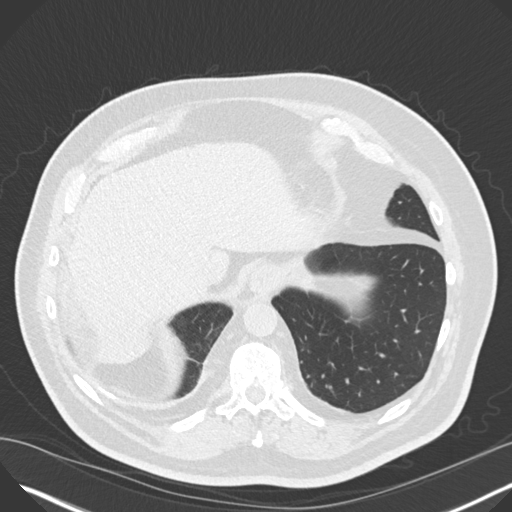
[im 46/156  lung]
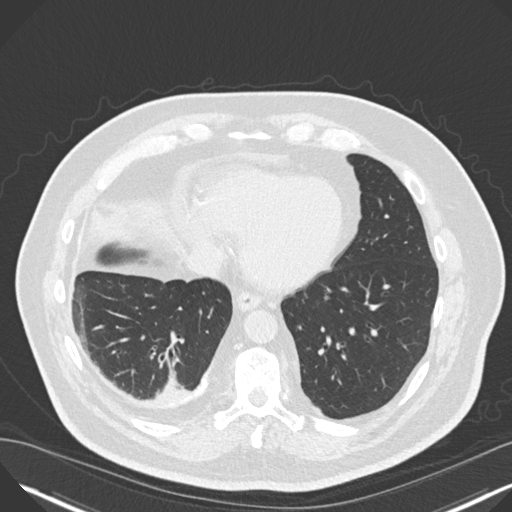
[im 58/156  mediastinal]
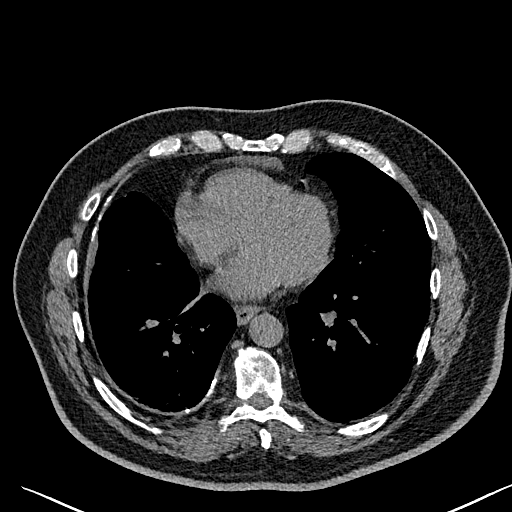
[im 58/156  lung]
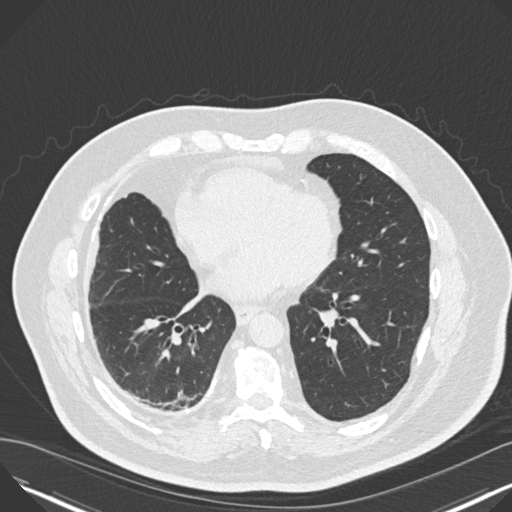
[im 64/156  lung]
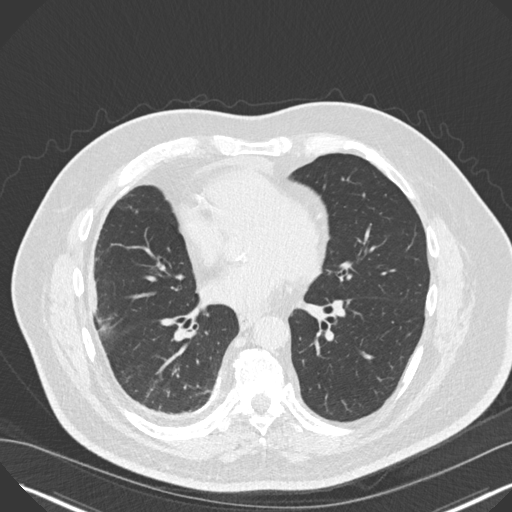
[im 74/156  lung]
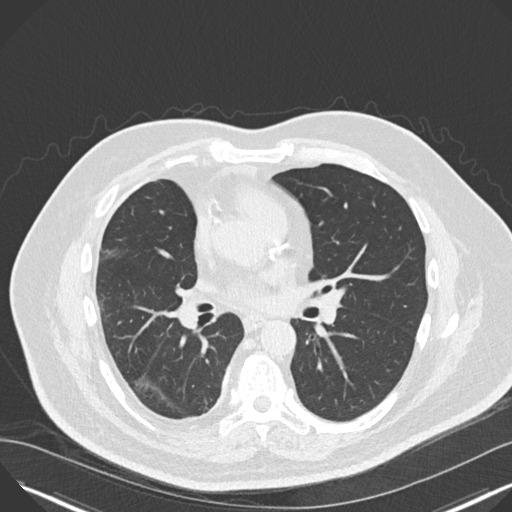
[im 83/156  lung]
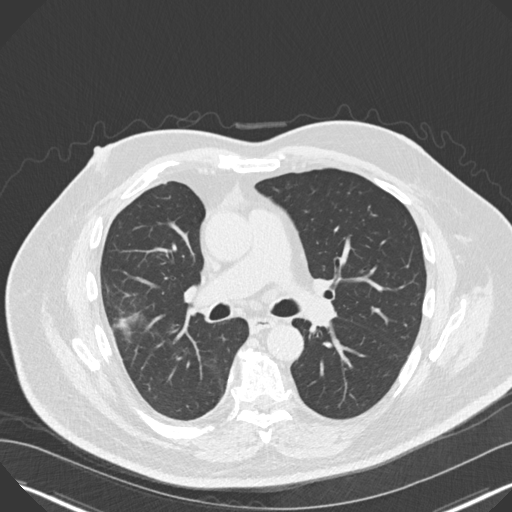
[im 92/156  mediastinal]
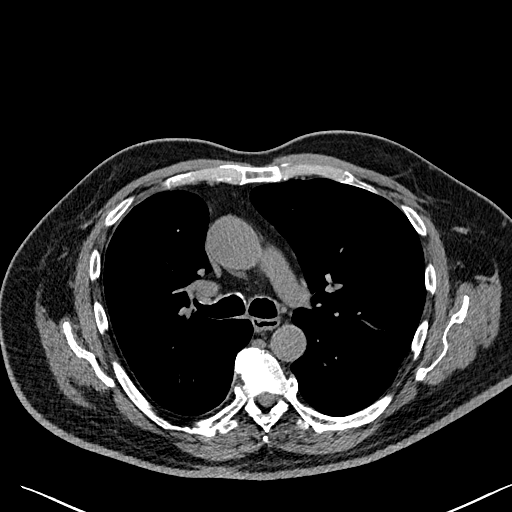
[im 92/156  lung]
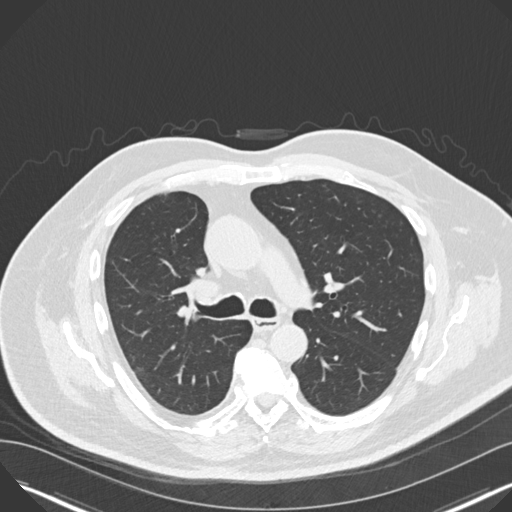
[im 98/156  lung]
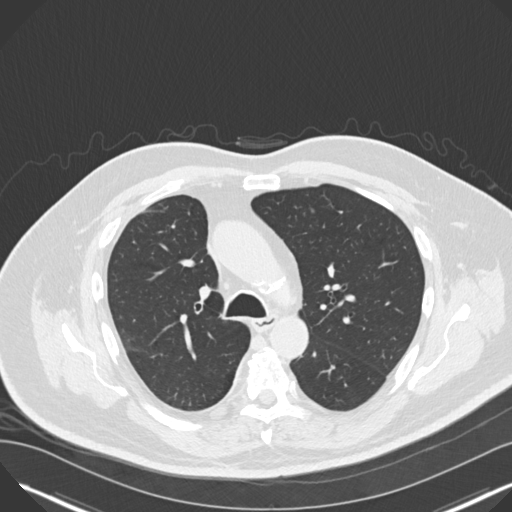
[im 115/156  lung]
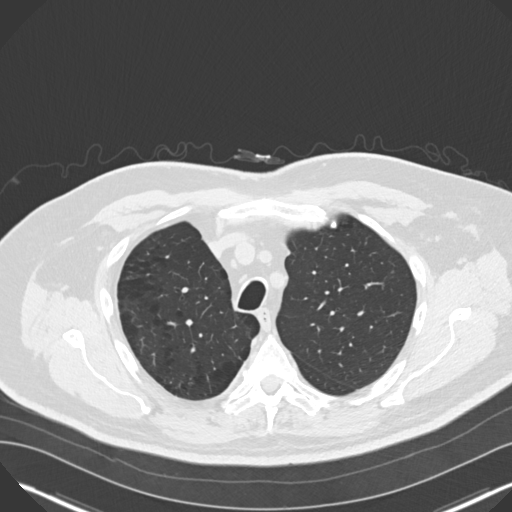
[im 125/156  lung]
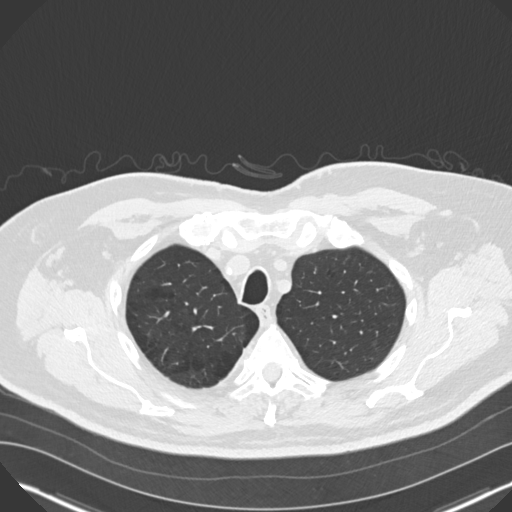
[im 133/156  mediastinal]
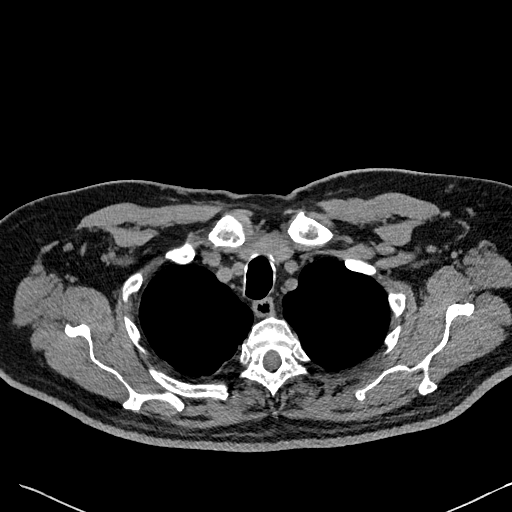
[im 133/156  lung]
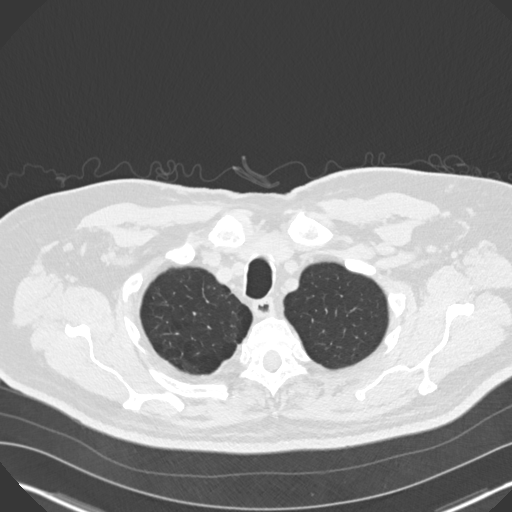
[im 144/156  lung]
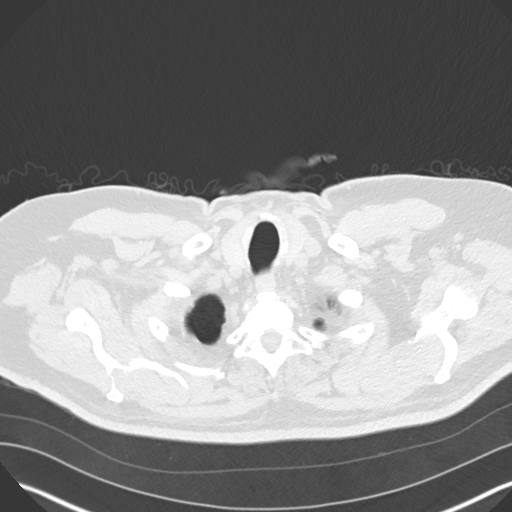

[14 of 34 positions shown; findings below may reference images not displayed]

FINDINGS: Cardiovascular: The heart size is normal. No substantial pericardial
effusion. Coronary artery calcification is evident. Atherosclerotic
calcification is noted in the wall of the thoracic aorta.

Mediastinum/Nodes: No mediastinal lymphadenopathy. No evidence for
gross hilar lymphadenopathy although assessment is limited by the
lack of intravenous contrast on today's study. The esophagus has
normal imaging features. There is no axillary lymphadenopathy.

Lungs/Pleura: Volume loss noted right hemithorax. Left apical
pleuroparenchymal scarring is stable. Tiny 2 mm nodules in the left
lung apex are unchanged pleural thickening noted on the right with
associated right pleural calcification in the lower hemithorax.
Centrilobular and paraseptal emphysema evident. Similar appearance
of peripheral volume loss/scarring posterior right lower lobe.
Loculated fissural fluid seen in the right hemithorax on the
previous study has essentially resolved. 4 mm right lower lobe
nodule on 97/3 was probably obscured by some atelectasis on the
prior study. 4 mm posterior right upper lobe nodule (55/3)
identified previously is unchanged in the interval.

Upper Abdomen: Unremarkable.

Musculoskeletal: No worrisome lytic or sclerotic osseous
abnormality.
IMPRESSION: 1. Stable 4 mm right upper lobe pulmonary nodule. Another 4 mm
nodule seen in the posterior right lower lobe was in an area of
atelectasis on the prior study. Given that this particular nodule
was not clearly visualized previously, non-contrast chest CT can be
considered in 12 months if patient is high-risk. This recommendation
follows the consensus statement: Guidelines for Management of
Incidental Pulmonary Nodules Detected on CT Images: From the
2. Volume loss right hemithorax associated with diffuse smooth
pleural thickening and pleural calcification in the lower right
chest. Given unilateral involvement, prior empyema or hemothorax
would be a likely consideration.
3. Stable scarring posterior right lower lobe with interval
resolution of loculated fissural fluid seen on the previous exam.
4.  Aortic Atherosclerois (7C0UR-170.0)
5.  Emphysema. (7C0UR-WTM.T)

## 2020-06-01 NOTE — Progress Notes (Signed)
Cardiology Office Note  Date:  06/02/2020   ID:  Joe Berry, DOB 1956-08-17, MRN [id number]  PCP:  Kendell Bane, NP   Chief Complaint  Patient presents with  . office visit    Pt has no complaints. Meds verbally reviewed w/ pt.    HPI:   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 No regular exercise program Into metal detection, fun hobby  Blood pressure well controlled at home BP 315 systolic at home  In general reports he has been doing well Some mild fatigue at home No chest pain or shortness of breath concerning for angina  Recent echocardiograms discussed with him showing improved ejection fraction last visualized 2019 He denies any leg edema, abdominal distention, weight gain concerning for fluid retention  Chart indicating 10 pound weight gain this year  Lab work reviewed  HBA1C 8.1 up from 7 Total chol 99, LDL 37  History of sleep apnea, tolerating CPAP Tolerating it Lasix 40 daily with potassium  EKG personally reviewed by myself on todays visit Shows normal sinus rhythm rate 80 bpm no significant ST-T wave changes  Other past medical history reviewed right Heart cath (very difficult procedure as unable to measure pressures in the PA and wedge) RA pressures: 25 mm Hg RV pressures: 77/17/mean of 25 LVEDP: 32 mm Hg  CT scan images showing significant coronary calcification all 3 vessels  Echocardiogram edges pulled up with him in the office showing severely depressed LV function moderately dilated LV  ejection fraction 25-30% dated 03/19/2018   history of sleep apnea and he is non compliant with CPAP therapy.     PMH:   has a past medical history of Anxiety, Cataract, CHF (congestive heart failure) ([place]), COPD (chronic obstructive pulmonary disease) ([place]), Diabetes mellitus without complication ([place]), and Sleep apnea.  PSH:    Past Surgical History:  Procedure Laterality Date  . LEFT HEART CATH AND CORONARY ANGIOGRAPHY Left 04/12/2018   Procedure: LEFT HEART CATH AND CORONARY ANGIOGRAPHY;  Surgeon: Minna Merritts, MD;  Location: [place] [facility];  Service: Cardiovascular;  Laterality: Left;    Current Outpatient Medications  Medication Sig Dispense Refill  . ACCU-CHEK FASTCLIX LANCETS MISC Use to test blood sugars twice daily e11.65 102 each 11  . aspirin EC 81 MG tablet Take 1 tablet (81 mg total) by mouth daily. 90 tablet 3  . azithromycin (ZITHROMAX) 250 MG tablet Take as directed 6 tablet 0  . Blood Glucose Monitoring Suppl (ONETOUCH VERIO) w/Device KIT Use kit to test blood sugars . Dx e11.65 1 kit 0  . diphenhydrAMINE HCl (ZZZQUIL) 50 MG/30ML LIQD Take 30 mLs by mouth at bedtime as needed (sleep).    . furosemide (LASIX) 40 MG tablet TAKE 1 TABLET DAILY 90 tablet 0  . glimepiride (AMARYL) 2 MG tablet Take 1 tablet (2 mg total) by mouth daily before breakfast. 90 tablet 0  . glucose blood (ONETOUCH VERIO) test strip Use as instructed , test blood sugars twice a day. e11.65 100 each 11  . metFORMIN (GLUCOPHAGE) 500 MG tablet TAKE 1 TABLET THREE TIMES A DAY WITH MEALS 270  tablet 3  . metoprolol succinate (TOPROL-XL) 50 MG 24 hr tablet TAKE 1 TABLET DAILY. TAKE WITH OR IMMEDIATELY FOLLOWING A MEAL 90 tablet 0  . potassium chloride (K-DUR) 10 MEQ tablet TAKE 1 TABLET DAILY 90 tablet 3  . rosuvastatin (CRESTOR) 10 MG tablet Take 1 tablet (10 mg total) by mouth daily. 90 tablet 3  . sacubitril-valsartan (ENTRESTO) 49-51 MG Take 1 tablet by mouth 2 (two) times daily. 180 tablet 3   . spironolactone (ALDACTONE) 25 MG tablet TAKE 1 TABLET DAILY 90 tablet 3  . TRULICITY 1.5 XH/3.7JI SOPN INJECT UNDER THE SKIN ONCE WEEKLY IN THE ABDOMEN, THIGH, OR UPPER ARM, ANY TIME OF DAY, WITH OR WITHOUT FOOD. 6 mL 3   No current facility-administered medications for this visit.    Allergies:   Patient has no known allergies.   Social History:  The patient  reports that he has quit smoking. His smoking use included cigarettes. He has a 60.00 pack-year smoking history. He has never used smokeless tobacco. He reports current alcohol use. He reports that he does not use drugs.   Family History:   family history includes Heart Problems in his brother, maternal grandfather, and maternal grandmother; Heart disease in his maternal grandmother and mother; Lung cancer in his father.    Review of Systems  Constitutional: Negative.   HENT: Negative.   Respiratory: Negative.   Cardiovascular: Negative.   Gastrointestinal: Negative.   Musculoskeletal: Negative.   Neurological: Negative.   Psychiatric/Behavioral: Negative.   All other systems reviewed and are negative.   PHYSICAL EXAM: VS:  BP 110/70 (BP Location: Left Arm, Patient Position: Sitting, Cuff Size: Normal)   Pulse 80   Ht '5\' 8"'  (1.727 m)   Wt 211 lb 8 oz (95.9 kg)   SpO2 96%   BMI 32.16 kg/m  , BMI Body mass index is 32.16 kg/m. Constitutional:  oriented to person, place, and time. No distress.  HENT:  Head: Grossly normal Eyes:  no discharge. No scleral icterus.  Neck: No JVD, no carotid bruits  Cardiovascular: Regular rate and rhythm, no murmurs appreciated Pulmonary/Chest: Clear to auscultation bilaterally, no wheezes or rails Abdominal: Soft.  no distension.  no tenderness.  Musculoskeletal: Normal range of motion Neurological:  normal muscle tone. Coordination normal. No atrophy Skin: Skin warm and dry Psychiatric: normal affect, pleasant   Recent Labs: 09/29/2019: Hemoglobin 15.6; Platelets 322; TSH 1.660     Lipid Panel Lab Results  Component Value Date   CHOL 99 (L) 04/01/2020   HDL 38 (L) 04/01/2020   LDLCALC 37 04/01/2020   TRIG 134 04/01/2020      Wt Readings from Last 3 Encounters:  06/02/20 211 lb 8 oz (95.9 kg)  04/21/20 208 lb (94.3 kg)  02/24/20 201 lb (91.2 kg)     ASSESSMENT AND PLAN:  Dilated cardiomyopathy ([place]) -  Continue Entresto, metoprolol, Lasix daily with spironolactone Blood pressure low but stable, no near syncope/orthostasis symptoms Weight gain likely from dietary issues, less exercise   Hyperlipidemia Cholesterol at goal, no medication changes made Numbers discussed  Empyema lung (HCC) Prior CT scan showing COPD Breathing stable, no changes  SOB (shortness of breath) Recommended weight loss program, exercise  Coronary artery calcification with stable angina CT scan with diffuse heavy calcification LAD, circumflex and RCA Coronary artery disease seen on cardiac catheterization Stressed importance of aggressive cholesterol management  Cardiomyopathy Nonischemic Ejection fraction initially 20% up to 35% on repeat study 2019 Continue Entresto spironolactone metoprolol Lasix  Total encounter time more than 25 minutes  Greater than 50% was spent in counseling and coordination of care with the patient    Orders Placed This Encounter  Procedures  . EKG 12-Lead     Signed, Esmond Plants, M.D., Ph.D. 06/02/2020  [place], [place]

## 2020-06-02 ENCOUNTER — Ambulatory Visit: Payer: BC Managed Care – PPO | Admitting: Cardiovascular Disease

## 2020-06-02 ENCOUNTER — Other Ambulatory Visit: Payer: Self-pay

## 2020-06-02 ENCOUNTER — Encounter: Payer: Self-pay | Admitting: Cardiovascular Disease

## 2020-06-02 VITALS — BP 110/70 | HR 80 | Ht 68.0 in | Wt 211.5 lb

## 2020-06-02 DIAGNOSIS — I2510 Atherosclerotic heart disease of native coronary artery without angina pectoris: Secondary | ICD-10-CM

## 2020-06-02 DIAGNOSIS — I420 Dilated cardiomyopathy: Secondary | ICD-10-CM | POA: Diagnosis not present

## 2020-06-02 DIAGNOSIS — R0602 Shortness of breath: Secondary | ICD-10-CM | POA: Diagnosis not present

## 2020-06-02 DIAGNOSIS — J869 Pyothorax without fistula: Secondary | ICD-10-CM | POA: Diagnosis not present

## 2020-06-02 DIAGNOSIS — I272 Pulmonary hypertension, unspecified: Secondary | ICD-10-CM

## 2020-06-02 NOTE — Patient Instructions (Signed)

## 2020-07-01 ENCOUNTER — Other Ambulatory Visit: Payer: Self-pay | Admitting: Adult Health

## 2020-07-02 ENCOUNTER — Other Ambulatory Visit: Payer: Self-pay | Admitting: Adult Health

## 2020-07-02 DIAGNOSIS — E1165 Type 2 diabetes mellitus with hyperglycemia: Secondary | ICD-10-CM

## 2020-07-05 ENCOUNTER — Other Ambulatory Visit: Payer: Self-pay | Admitting: Cardiovascular Disease

## 2020-07-05 NOTE — Telephone Encounter (Signed)
Patient will need an appointment for further refills, Thanks !  

## 2020-07-05 NOTE — Telephone Encounter (Signed)
Patient was in office 7/14 with an AVS that states a 12 month fu

## 2020-07-27 ENCOUNTER — Telehealth: Payer: Self-pay

## 2020-07-27 NOTE — Telephone Encounter (Signed)
Confirmed and screened for OV on 9/8 

## 2020-07-28 ENCOUNTER — Ambulatory Visit: Payer: BC Managed Care – PPO | Admitting: Hospice and Palliative Medicine

## 2020-07-28 ENCOUNTER — Encounter: Payer: Self-pay | Admitting: Hospice and Palliative Medicine

## 2020-07-28 ENCOUNTER — Other Ambulatory Visit: Payer: Self-pay

## 2020-07-28 VITALS — BP 119/80 | HR 79 | Temp 98.4°F | Resp 16 | Ht 68.0 in | Wt 214.2 lb

## 2020-07-28 DIAGNOSIS — J449 Chronic obstructive pulmonary disease, unspecified: Secondary | ICD-10-CM

## 2020-07-28 DIAGNOSIS — I700 Atherosclerosis of aorta: Secondary | ICD-10-CM

## 2020-07-28 DIAGNOSIS — E1165 Type 2 diabetes mellitus with hyperglycemia: Secondary | ICD-10-CM

## 2020-07-28 DIAGNOSIS — I10 Essential (primary) hypertension: Secondary | ICD-10-CM | POA: Diagnosis not present

## 2020-07-28 DIAGNOSIS — I5022 Chronic systolic (congestive) heart failure: Secondary | ICD-10-CM

## 2020-07-28 LAB — POCT GLYCOSYLATED HEMOGLOBIN (HGB A1C): Hemoglobin A1C: 7.2 % — AB (ref 4.0–5.6)

## 2020-07-28 NOTE — Progress Notes (Signed)
[facility] [place], [place] [address]  Internal MEDICINE  Office Visit Note  Patient Name: Joe Berry  [id number]  [id number]  Date of Service: 07/29/2020  Chief Complaint  Patient presents with  . Follow-up  . Diabetes  . Sleep Apnea  . Quality Metric Gaps    hepC, TDAP, colonoscopy HIV screening    HPI Patient is here today for routine follow-up. DM-He has been focusing on his diet and has noticed an improvement in his blood sugars when he checks them at home He has not yet had a diabetic eye exam this year, he is in the middle of changing insurance companies and next month plans to set up an appointment [place] will get diabetic eye exam once he is on medicare Discussed that he was due to have a colonoscopy this year. He has been putting it off due to [place]. Again he would like to discuss this again once his insurance changes. Was seen by his cardiologist last month-says it was a good visit, again worried about the price of his medications when his insurance changes   Current Medication: Outpatient Encounter Medications as of 07/28/2020  Medication Sig  . ACCU-CHEK FASTCLIX LANCETS MISC Use to test blood sugars twice daily e11.65  . aspirin EC 81 MG tablet Take 1 tablet (81 mg total) by mouth daily.  Marland Kitchen azithromycin (ZITHROMAX) 250 MG tablet Take as directed  . Blood Glucose Monitoring Suppl (ONETOUCH VERIO) w/Device KIT Use kit to test blood sugars . Dx e11.65  . diphenhydrAMINE HCl (ZZZQUIL) 50 MG/30ML LIQD Take 30 mLs by mouth at bedtime as needed (sleep).  . furosemide (LASIX) 40 MG tablet TAKE 1 TABLET DAILY  . glimepiride (AMARYL) 2 MG tablet TAKE 1 TABLET DAILY BEFORE BREAKFAST  . glucose blood (ONETOUCH VERIO) test strip Use as instructed , test blood sugars twice a day. e11.65  . metFORMIN (GLUCOPHAGE) 500 MG tablet TAKE 1 TABLET THREE TIMES A DAY WITH MEALS  . metoprolol succinate (TOPROL-XL) 50 MG 24 hr tablet TAKE 1  TABLET DAILY. TAKE WITH OR IMMEDIATELY FOLLOWING A MEAL  . potassium chloride (KLOR-CON) 10 MEQ tablet TAKE 1 TABLET DAILY  . sacubitril-valsartan (ENTRESTO) 49-51 MG Take 1 tablet by mouth 2 (two) times daily.  Marland Kitchen spironolactone (ALDACTONE) 25 MG tablet TAKE 1 TABLET DAILY  . TRULICITY 1.5 BU/3.8GT SOPN INJECT UNDER THE SKIN ONCE WEEKLY IN THE ABDOMEN, THIGH, OR UPPER ARM, ANY TIME OF DAY, WITH OR WITHOUT FOOD.  Marland Kitchen rosuvastatin (CRESTOR) 10 MG tablet Take 1 tablet (10 mg total) by mouth daily.   No facility-administered encounter medications on file as of 07/28/2020.    Surgical History: Past Surgical History:  Procedure Laterality Date  . LEFT HEART CATH AND CORONARY ANGIOGRAPHY Left 04/12/2018   Procedure: LEFT HEART CATH AND CORONARY ANGIOGRAPHY;  Surgeon: Minna Merritts, MD;  Location: [facility];  Service: Cardiovascular;  Laterality: Left;    Medical History: Past Medical History:  Diagnosis Date  . Anxiety   . Cataract   . CHF (congestive heart failure) ([place])   . COPD (chronic obstructive pulmonary disease) ([place])   . Diabetes mellitus without complication ([place])   . Sleep apnea     Family History: Family History  Problem Relation Age of Onset  . Heart disease Mother   . Lung cancer Father   . Heart disease Maternal Grandmother   . Heart Problems Maternal Grandmother   . Heart Problems Brother   .  Heart Problems Maternal Grandfather     Social History   Socioeconomic History  . Marital status: Single    Spouse name: Not on file  . Number of children: Not on file  . Years of education: Not on file  . Highest education level: Not on file  Occupational History  . Not on file  Tobacco Use  . Smoking status: Former Smoker    Packs/day: 1.50    Years: 40.00    Pack years: 60.00    Types: Cigarettes  . Smokeless tobacco: Never Used  . Tobacco comment: quit 2008  Vaping Use  . Vaping Use: Never used  Substance and Sexual Activity  . Alcohol use: Yes     Comment: occasional drink  . Drug use: Never  . Sexual activity: Not on file  Other Topics Concern  . Not on file  Social History Narrative  . Not on file   Social Determinants of Health   Financial Resource Strain:   . Difficulty of Paying Living Expenses: Not on file  Food Insecurity:   . Worried About Charity fundraiser in the Last Year: Not on file  . Ran Out of Food in the Last Year: Not on file  Transportation Needs:   . Lack of Transportation (Medical): Not on file  . Lack of Transportation (Non-Medical): Not on file  Physical Activity:   . Days of Exercise per Week: Not on file  . Minutes of Exercise per Session: Not on file  Stress:   . Feeling of Stress : Not on file  Social Connections:   . Frequency of Communication with Friends and Family: Not on file  . Frequency of Social Gatherings with Friends and Family: Not on file  . Attends Religious Services: Not on file  . Active Member of Clubs or Organizations: Not on file  . Attends Archivist Meetings: Not on file  . Marital Status: Not on file  Intimate Partner Violence:   . Fear of Current or Ex-Partner: Not on file  . Emotionally Abused: Not on file  . Physically Abused: Not on file  . Sexually Abused: Not on file   Review of Systems  Constitutional: Negative for activity change, chills, fatigue and unexpected weight change.  HENT: Negative for congestion, postnasal drip, rhinorrhea, sinus pressure, sneezing and sore throat.   Eyes: Negative for photophobia and visual disturbance.  Respiratory: Negative for cough, chest tightness and shortness of breath.   Cardiovascular: Negative for chest pain, palpitations and leg swelling.  Gastrointestinal: Negative for abdominal pain, constipation, diarrhea, nausea and vomiting.  Genitourinary: Negative for dysuria and frequency.  Musculoskeletal: Negative for arthralgias, back pain, joint swelling and neck pain.  Skin: Negative for rash.  Neurological:  Negative for dizziness, tremors, light-headedness, numbness and headaches.  Hematological: Negative for adenopathy. Does not bruise/bleed easily.  Psychiatric/Behavioral: Negative for behavioral problems (Depression), sleep disturbance and suicidal ideas. The patient is not nervous/anxious.     Vital Signs: BP 119/80   Pulse 79   Temp 98.4 F (36.9 C)   Resp 16   Ht '5\' 8"'  (1.727 m)   Wt 214 lb 3.2 oz (97.2 kg)   SpO2 96%   BMI 32.57 kg/m    Physical Exam Constitutional:      Appearance: Normal appearance.  HENT:     Mouth/Throat:     Mouth: Mucous membranes are moist.     Pharynx: Oropharynx is clear.  Cardiovascular:     Rate and Rhythm: Normal  rate and regular rhythm.     Pulses: Normal pulses.     Heart sounds: Normal heart sounds.  Pulmonary:     Effort: Pulmonary effort is normal.     Breath sounds: Normal breath sounds.  Abdominal:     General: Abdomen is flat.     Palpations: Abdomen is soft.  Musculoskeletal:        General: Normal range of motion.     Cervical back: Normal range of motion.  Skin:    General: Skin is warm.  Neurological:     General: No focal deficit present.     Mental Status: He is alert and oriented to person, place, and time. Mental status is at baseline.  Psychiatric:        Mood and Affect: Mood normal.        Behavior: Behavior normal.        Thought Content: Thought content normal.    Assessment/Plan: 1. Uncontrolled type 2 diabetes mellitus with hyperglycemia (HCC) A1C 7.2 today, improved since last check. At last visit, metformin dose was decreased and was started on glimepiride. Will continue with this treatment and continue monitoring. Encouraged to monitor his blood sugar levels routinely at home, treatment may need to be adjusted on levels. - POCT HgB A1C  2. Essential hypertension Stable at this time, continue with current therapy and continue with routine monitoring.  3. Chronic obstructive pulmonary disease,  unspecified COPD type ([place]) Stable at this time, not requiring daily inhaler at this time. Scheduled for pulmonary follow-up in November following PFT testing. Will review results at that follow-up. Will also need a follow-up chest CT for pulmonary nodules found on CT in 2020.  4. Chronic systolic heart failure ([place]) Stable at this time, followed by cardiology. Discussed repeating his echo once he changes insurance companies, last echo 2019.  5. Aortic atherosclerosis Continue with statin therapy, will address carotid US at next visit. Continue with statin therapy at this time, will need strict control and monitoring of lipids.  Discussed the need for diabetic eye exam, colonoscopy, echo as well as carotid US. He is interested in this testing but would like to hold off until he changes insurance companies. Will address these topics again at his CPE in December.  General Counseling: eleuterio dollar understanding of the findings of todays visit and agrees with plan of treatment. I have discussed any further diagnostic evaluation that may be needed or ordered today. We also reviewed his medications today. he has been encouraged to call the office with any questions or concerns that should arise related to todays visit.    Orders Placed This Encounter  Procedures  . POCT HgB A1C    Time spent: 30 Minutes Time spent includes review of chart, medications, test results and follow-up plan with the patient.  This patient was seen by Theodoro Grist AGNP-C in Collaboration with Dr Lavera Guise as a part of collaborative care agreement     Tanna Furry. Traye Bates AGNP-C Internal medicine

## 2020-07-29 ENCOUNTER — Encounter: Payer: Self-pay | Admitting: Hospice and Palliative Medicine

## 2020-08-16 ENCOUNTER — Other Ambulatory Visit: Payer: Self-pay | Admitting: Cardiovascular Disease

## 2020-08-17 ENCOUNTER — Other Ambulatory Visit: Payer: Self-pay | Admitting: Cardiovascular Disease

## 2020-09-29 ENCOUNTER — Other Ambulatory Visit: Payer: Self-pay

## 2020-09-29 ENCOUNTER — Ambulatory Visit (INDEPENDENT_AMBULATORY_CARE_PROVIDER_SITE_OTHER): Payer: Medicare Other

## 2020-09-29 DIAGNOSIS — G4733 Obstructive sleep apnea (adult) (pediatric): Secondary | ICD-10-CM

## 2020-09-29 NOTE — Progress Notes (Signed)
95 percentile pressure 13.4   95th percentile leak 4.9   apnea index 1.5 /hr  apnea-hypopnea index  2.7 /hr   total days used  >4 hr 24 days  total days used <4 hr 6 days  Total compliance 80 percent  He is doing great no problems or questions at this time

## 2020-10-06 ENCOUNTER — Telehealth: Payer: Self-pay

## 2020-10-06 ENCOUNTER — Other Ambulatory Visit: Payer: Self-pay

## 2020-10-06 ENCOUNTER — Other Ambulatory Visit: Payer: Self-pay | Admitting: Hospice and Palliative Medicine

## 2020-10-06 ENCOUNTER — Ambulatory Visit (INDEPENDENT_AMBULATORY_CARE_PROVIDER_SITE_OTHER): Payer: Medicare Other | Admitting: Internal Medicine

## 2020-10-06 DIAGNOSIS — R0602 Shortness of breath: Secondary | ICD-10-CM

## 2020-10-06 DIAGNOSIS — J449 Chronic obstructive pulmonary disease, unspecified: Secondary | ICD-10-CM

## 2020-10-06 DIAGNOSIS — Z125 Encounter for screening for malignant neoplasm of prostate: Secondary | ICD-10-CM

## 2020-10-06 DIAGNOSIS — R5383 Other fatigue: Secondary | ICD-10-CM

## 2020-10-06 DIAGNOSIS — Z13220 Encounter for screening for lipoid disorders: Secondary | ICD-10-CM

## 2020-10-06 DIAGNOSIS — Z0001 Encounter for general adult medical examination with abnormal findings: Secondary | ICD-10-CM

## 2020-10-06 LAB — PULMONARY FUNCTION TEST

## 2020-10-07 ENCOUNTER — Other Ambulatory Visit: Payer: Self-pay

## 2020-10-07 ENCOUNTER — Ambulatory Visit
Admission: RE | Admit: 2020-10-07 | Discharge: 2020-10-07 | Disposition: A | Discharge status: Home / Self Care | Payer: Medicare Other | Source: Ambulatory Visit | Attending: Internal Medicine | Admitting: Internal Medicine

## 2020-10-07 DIAGNOSIS — R918 Other nonspecific abnormal finding of lung field: Secondary | ICD-10-CM

## 2020-10-07 NOTE — Telephone Encounter (Signed)
Labs have been ordered and pt notified. Joe Berry

## 2020-10-09 NOTE — Procedures (Signed)
[facility] [address] [place], [address]  DATE OF SERVICE: October 06, 2020  Complete Pulmonary Function Testing Interpretation:  FINDINGS:  Forced vital capacity is mildly decreased.  FEV1 is 1.84 L which is 58% of predicted and is moderately decreased.  Postbronchodilator no significant change in FEV1 clinical improvement may still occur in the absence of spirometric improvement.  FEV1 FVC ratio is mildly decreased.  Total lung capacity is mildly decreased residual volume is normal residual into lung capacity ratio is increased.  FRC is mildly decreased.  DLCO is mildly decreased.  DLCO is normal corrected for alveolar volume.  IMPRESSION:  This pulmonary function study is most consistent with a moderate obstructive lung disease.  There may be a component of mild restrictive lung disease clinical correlation suggested  Yevonne Pax, MD [facility] Pulmonary Critical Care Medicine Sleep Medicine

## 2020-10-18 ENCOUNTER — Ambulatory Visit (INDEPENDENT_AMBULATORY_CARE_PROVIDER_SITE_OTHER): Payer: Medicare Other | Admitting: Internal Medicine

## 2020-10-18 ENCOUNTER — Telehealth: Payer: Self-pay

## 2020-10-18 ENCOUNTER — Encounter: Payer: Self-pay | Admitting: Internal Medicine

## 2020-10-18 ENCOUNTER — Other Ambulatory Visit: Payer: Self-pay

## 2020-10-18 VITALS — BP 110/72 | HR 85 | Temp 98.1°F | Resp 16 | Ht 68.0 in | Wt 213.8 lb

## 2020-10-18 DIAGNOSIS — I2721 Secondary pulmonary arterial hypertension: Secondary | ICD-10-CM | POA: Diagnosis not present

## 2020-10-18 DIAGNOSIS — J4520 Mild intermittent asthma, uncomplicated: Secondary | ICD-10-CM

## 2020-10-18 DIAGNOSIS — G4733 Obstructive sleep apnea (adult) (pediatric): Secondary | ICD-10-CM

## 2020-10-18 DIAGNOSIS — I2510 Atherosclerotic heart disease of native coronary artery without angina pectoris: Secondary | ICD-10-CM

## 2020-10-18 DIAGNOSIS — I5022 Chronic systolic (congestive) heart failure: Secondary | ICD-10-CM

## 2020-10-18 NOTE — Telephone Encounter (Signed)
Pt assistant paperwork  Completed and faxed to [phone]

## 2020-10-18 NOTE — Patient Instructions (Signed)
Echocardiogram An echocardiogram is a procedure that uses painless sound waves (ultrasound) to produce an image of the heart. Images from an echocardiogram can provide important information about:  Signs of coronary artery disease (CAD).  Aneurysm detection. An aneurysm is a weak or damaged part of an artery wall that bulges out from the normal force of blood pumping through the body.  Heart size and shape. Changes in the size or shape of the heart can be associated with certain conditions, including heart failure, aneurysm, and CAD.  Heart muscle function.  Heart valve function.  Signs of a past heart attack.  Fluid buildup around the heart.  Thickening of the heart muscle.  A tumor or infectious growth around the heart valves. Tell a health care provider about:  Any allergies you have.  All medicines you are taking, including vitamins, herbs, eye drops, creams, and over-the-counter medicines.  Any blood disorders you have.  Any surgeries you have had.  Any medical conditions you have.  Whether you are pregnant or may be pregnant. What are the risks? Generally, this is a safe procedure. However, problems may occur, including:  Allergic reaction to dye (contrast) that may be used during the procedure. What happens before the procedure? No specific preparation is needed. You may eat and drink normally. What happens during the procedure?   An IV tube may be inserted into one of your veins.  You may receive contrast through this tube. A contrast is an injection that improves the quality of the pictures from your heart.  A gel will be applied to your chest.  A wand-like tool (transducer) will be moved over your chest. The gel will help to transmit the sound waves from the transducer.  The sound waves will harmlessly bounce off of your heart to allow the heart images to be captured in real-time motion. The images will be recorded on a computer. The procedure may vary  among health care providers and hospitals. What happens after the procedure?  You may return to your normal, everyday life, including diet, activities, and medicines, unless your health care provider tells you not to do that. Summary  An echocardiogram is a procedure that uses painless sound waves (ultrasound) to produce an image of the heart.  Images from an echocardiogram can provide important information about the size and shape of your heart, heart muscle function, heart valve function, and fluid buildup around your heart.  You do not need to do anything to prepare before this procedure. You may eat and drink normally.  After the echocardiogram is completed, you may return to your normal, everyday life, unless your health care provider tells you not to do that. This information is not intended to replace advice given to you by your health care provider. Make sure you discuss any questions you have with your health care provider. Document Revised: 02/27/2019 Document Reviewed: 12/09/2016 Elsevier Patient Education  2020 [employer]  

## 2020-10-18 NOTE — Progress Notes (Signed)
[facility] [place], [place] [address]  Pulmonary Sleep Medicine   Office Visit Note  Patient Name: Joe Berry DOB: 06/03/1956 MRN [id number]  Date of Service: 10/18/2020  Complaints/HPI: CT chest PFT Cardiomyopathy EF around 35-40% patient here for follow-up of CT of the chest pulmonary functions and also with congestive cardiomyopathy.  Patient has been beta blockers as well as diuretics for the heart failure.  As far as her CT of the chest is concerned the results were reviewed and discussed with him in detail today.  Scan basically appears to be stable from prior comparisons and will need ongoing low-dose verification and follow-up.  Pulmonary function studies discussed where the FEV1 was 58% of predicted moderately decreased.  He does appear to be doing okay with current medical management.  ROS  General: (-) fever, (-) chills, (-) night sweats, (-) weakness Skin: (-) rashes, (-) itching,. Eyes: (-) visual changes, (-) redness, (-) itching. Nose and Sinuses: (-) nasal stuffiness or itchiness, (-) postnasal drip, (-) nosebleeds, (-) sinus trouble. Mouth and Throat: (-) sore throat, (-) hoarseness. Neck: (-) swollen glands, (-) enlarged thyroid, (-) neck pain. Respiratory: - cough, (-) bloody sputum, + shortness of breath, - wheezing. Cardiovascular: - ankle swelling, (-) chest pain. Lymphatic: (-) lymph node enlargement. Neurologic: (-) numbness, (-) tingling. Psychiatric: (-) anxiety, (-) depression   Current Medication: Outpatient Encounter Medications as of 10/18/2020  Medication Sig  . ACCU-CHEK FASTCLIX LANCETS MISC Use to test blood sugars twice daily e11.65  . aspirin EC 81 MG tablet Take 1 tablet (81 mg total) by mouth daily.  . diphenhydrAMINE HCl (ZZZQUIL) 50 MG/30ML LIQD Take 30 mLs by mouth at bedtime as needed (sleep).  . furosemide (LASIX) 40 MG tablet TAKE 1 TABLET DAILY  . glimepiride (AMARYL) 2 MG tablet TAKE 1 TABLET DAILY BEFORE  BREAKFAST  . metFORMIN (GLUCOPHAGE) 500 MG tablet TAKE 1 TABLET THREE TIMES A DAY WITH MEALS  . metoprolol succinate (TOPROL-XL) 50 MG 24 hr tablet TAKE 1 TABLET DAILY. TAKE WITH OR IMMEDIATELY FOLLOWING A MEAL  . potassium chloride (KLOR-CON) 10 MEQ tablet TAKE 1 TABLET DAILY  . sacubitril-valsartan (ENTRESTO) 49-51 MG Take 1 tablet by mouth 2 (two) times daily.  Marland Kitchen spironolactone (ALDACTONE) 25 MG tablet TAKE 1 TABLET DAILY  . TRULICITY 1.5 XB/2.6OM SOPN INJECT UNDER THE SKIN ONCE WEEKLY IN THE ABDOMEN, THIGH, OR UPPER ARM, ANY TIME OF DAY, WITH OR WITHOUT FOOD.  Marland Kitchen rosuvastatin (CRESTOR) 10 MG tablet Take 1 tablet (10 mg total) by mouth daily.  . [DISCONTINUED] azithromycin (ZITHROMAX) 250 MG tablet Take as directed (Patient not taking: Reported on 10/18/2020)  . [DISCONTINUED] Blood Glucose Monitoring Suppl (ONETOUCH VERIO) w/Device KIT Use kit to test blood sugars . Dx e11.65 (Patient not taking: Reported on 10/18/2020)  . [DISCONTINUED] glucose blood (ONETOUCH VERIO) test strip Use as instructed , test blood sugars twice a day. e11.65 (Patient not taking: Reported on 10/18/2020)   No facility-administered encounter medications on file as of 10/18/2020.    Surgical History: Past Surgical History:  Procedure Laterality Date  . LEFT HEART CATH AND CORONARY ANGIOGRAPHY Left 04/12/2018   Procedure: LEFT HEART CATH AND CORONARY ANGIOGRAPHY;  Surgeon: Minna Merritts, MD;  Location: [facility];  Service: Cardiovascular;  Laterality: Left;    Medical History: Past Medical History:  Diagnosis Date  . Anxiety   . Cataract   . CHF (congestive heart failure) ([place])   . COPD (chronic obstructive pulmonary disease) ([place])   .  Diabetes mellitus without complication ([place])   . Sleep apnea     Family History: Family History  Problem Relation Age of Onset  . Heart disease Mother   . Lung cancer Father   . Heart disease Maternal Grandmother   . Heart Problems Maternal Grandmother   .  Heart Problems Brother   . Heart Problems Maternal Grandfather     Social History: Social History   Socioeconomic History  . Marital status: Single    Spouse name: Not on file  . Number of children: Not on file  . Years of education: Not on file  . Highest education level: Not on file  Occupational History  . Not on file  Tobacco Use  . Smoking status: Former Smoker    Packs/day: 1.50    Years: 40.00    Pack years: 60.00    Types: Cigarettes  . Smokeless tobacco: Never Used  . Tobacco comment: quit 2008  Vaping Use  . Vaping Use: Never used  Substance and Sexual Activity  . Alcohol use: Yes    Alcohol/week: 1.0 standard drink    Types: 1 Shots of liquor per week    Comment: occasional drink  . Drug use: Never  . Sexual activity: Not on file  Other Topics Concern  . Not on file  Social History Narrative  . Not on file   Social Determinants of Health   Financial Resource Strain:   . Difficulty of Paying Living Expenses: Not on file  Food Insecurity:   . Worried About Charity fundraiser in the Last Year: Not on file  . Ran Out of Food in the Last Year: Not on file  Transportation Needs:   . Lack of Transportation (Medical): Not on file  . Lack of Transportation (Non-Medical): Not on file  Physical Activity:   . Days of Exercise per Week: Not on file  . Minutes of Exercise per Session: Not on file  Stress:   . Feeling of Stress : Not on file  Social Connections:   . Frequency of Communication with Friends and Family: Not on file  . Frequency of Social Gatherings with Friends and Family: Not on file  . Attends Religious Services: Not on file  . Active Member of Clubs or Organizations: Not on file  . Attends Archivist Meetings: Not on file  . Marital Status: Not on file  Intimate Partner Violence:   . Fear of Current or Ex-Partner: Not on file  . Emotionally Abused: Not on file  . Physically Abused: Not on file  . Sexually Abused: Not on file     Vital Signs: Blood pressure 110/72, pulse 85, temperature 98.1 F (36.7 C), resp. rate 16, height _0  (1.727 m), weight 213 lb 12.8 oz (97 kg), SpO2 98 %.  Examination: General Appearance: The patient is well-developed, well-nourished, and in no distress. Skin: Gross inspection of skin unremarkable. Head: normocephalic, no gross deformities. Eyes: no gross deformities noted. ENT: ears appear grossly normal no exudates. Neck: Supple. No thyromegaly. No LAD. Respiratory: no rhonchi noted at this time. Cardiovascular: Normal S1 and S2 without murmur or rub. Extremities: No cyanosis. pulses are equal. Neurologic: Alert and oriented. No involuntary movements.  LABS: Recent Results (from the past 2160 hour(s))  POCT HgB A1C     Status: Abnormal   Collection Time: 07/28/20  9:05 AM  Result Value Ref Range   Hemoglobin A1C 7.2 (A) 4.0 - 5.6 %   HbA1c POC (<> result,  manual entry)     HbA1c, POC (prediabetic range)     HbA1c, POC (controlled diabetic range)      Radiology: CT CHEST WO CONTRAST  Result Date: 10/08/2020 CLINICAL DATA:  F/u lung nodule per pt. NKi. No complaints or problem today. EXAM: CT CHEST WITHOUT CONTRAST TECHNIQUE: Multidetector CT imaging of the chest was performed following the standard protocol without IV contrast. COMPARISON:  10/10/2019, 03/19/2018 and 02/06/2018 FINDINGS: Cardiovascular: Heart is normal in size and configuration. Trace pericardial effusion similar to the prior CT. Three-vessel coronary artery calcifications. Great vessels are normal in caliber. Mild aortic atherosclerotic calcifications. Mediastinum/Nodes: No enlarged mediastinal or axillary lymph nodes. Thyroid gland, trachea, and esophagus demonstrate no significant findings. Lungs/Pleura: Pleural thickening and calcification with underlying increased subpleural fat along the right mid to lower hemithorax, most evident at the lung base, stable compared to the prior exams. Triangular shaped  peribronchovascular opacity, with a wide pleural base lies along the posterior right lower lobe extending to the posterior costophrenic sulcus, unchanged from the prior studies and consistent with scarring. Additional milder areas of linear scarring or atelectasis noted in the right lower lobe, right middle lobe inferior, lateral right upper lobe, stable from the most recent prior exam. Stable left apical pleuroparenchymal scarring. Two small nodules currently measuring 3 mm, right upper lobe, image 64, series 2, and right lower lobe, image 106, series 2, stable from the prior study. Previously described tiny left apical nodules are not well-defined on the current exam. No new nodules. Stable paraseptal and centrilobular emphysema most evident in the right upper lobe. No pleural effusion or pneumothorax. Upper Abdomen: No acute abnormality. Musculoskeletal: No fracture or acute findings.  No bone lesion. IMPRESSION: 1. Stable appearance from the most recent prior chest CT. 2. 2 small benign nodules, right upper lobe and right lower lobe currently measuring 3 mm, without change from the previous exam. Right upper lobe nodule stable since 02/06/2018. Right lower lobe nodule stable since 10/10/2019, and likely earlier but previously obscured by now resolved lung atelectasis and fissural fluid. No specific imaging follow-up recommended for these nodules. 3. No new nodules. 4. Stable pleural thickening and calcification the right, associated with right hemithorax volume loss and areas of lung scarring, consistent with the sequela previous empyema or hemothorax. 5. Stable pleuroparenchymal scarring at the left apex. 6. Mild centrilobular and paraseptal emphysema. 7. Mild aortic atherosclerosis and 3 vessel coronary artery calcifications. Aortic Atherosclerosis (ICD10-I70.0) and Emphysema (ICD10-J43.9). Electronically Signed   By: Lajean Manes M.D.   On: 10/08/2020 09:41    No results found.  CT CHEST WO  CONTRAST  Result Date: 10/08/2020 CLINICAL DATA:  F/u lung nodule per pt. NKi. No complaints or problem today. EXAM: CT CHEST WITHOUT CONTRAST TECHNIQUE: Multidetector CT imaging of the chest was performed following the standard protocol without IV contrast. COMPARISON:  10/10/2019, 03/19/2018 and 02/06/2018 FINDINGS: Cardiovascular: Heart is normal in size and configuration. Trace pericardial effusion similar to the prior CT. Three-vessel coronary artery calcifications. Great vessels are normal in caliber. Mild aortic atherosclerotic calcifications. Mediastinum/Nodes: No enlarged mediastinal or axillary lymph nodes. Thyroid gland, trachea, and esophagus demonstrate no significant findings. Lungs/Pleura: Pleural thickening and calcification with underlying increased subpleural fat along the right mid to lower hemithorax, most evident at the lung base, stable compared to the prior exams. Triangular shaped peribronchovascular opacity, with a wide pleural base lies along the posterior right lower lobe extending to the posterior costophrenic sulcus, unchanged from the prior studies and consistent with  scarring. Additional milder areas of linear scarring or atelectasis noted in the right lower lobe, right middle lobe inferior, lateral right upper lobe, stable from the most recent prior exam. Stable left apical pleuroparenchymal scarring. Two small nodules currently measuring 3 mm, right upper lobe, image 64, series 2, and right lower lobe, image 106, series 2, stable from the prior study. Previously described tiny left apical nodules are not well-defined on the current exam. No new nodules. Stable paraseptal and centrilobular emphysema most evident in the right upper lobe. No pleural effusion or pneumothorax. Upper Abdomen: No acute abnormality. Musculoskeletal: No fracture or acute findings.  No bone lesion. IMPRESSION: 1. Stable appearance from the most recent prior chest CT. 2. 2 small benign nodules, right upper  lobe and right lower lobe currently measuring 3 mm, without change from the previous exam. Right upper lobe nodule stable since 02/06/2018. Right lower lobe nodule stable since 10/10/2019, and likely earlier but previously obscured by now resolved lung atelectasis and fissural fluid. No specific imaging follow-up recommended for these nodules. 3. No new nodules. 4. Stable pleural thickening and calcification the right, associated with right hemithorax volume loss and areas of lung scarring, consistent with the sequela previous empyema or hemothorax. 5. Stable pleuroparenchymal scarring at the left apex. 6. Mild centrilobular and paraseptal emphysema. 7. Mild aortic atherosclerosis and 3 vessel coronary artery calcifications. Aortic Atherosclerosis (ICD10-I70.0) and Emphysema (ICD10-J43.9). Electronically Signed   By: Lajean Manes M.D.   On: 10/08/2020 09:41      Assessment and Plan: Patient Active Problem List   Diagnosis Date Noted  . Pulmonary HTN ([place]) 05/04/2018  . Unstable angina ([place]) 04/04/2018  . Dilated cardiomyopathy ([place]) 04/03/2018  . Coronary artery calcification seen on CAT scan 04/03/2018  . Empyema lung ([place]) 03/12/2018  . Pleural effusion 03/12/2018  . Pleural plaque without asbestos 03/12/2018  . SOB (shortness of breath) 03/12/2018  . Lung mass 03/12/2018    1. OSA patient has been doing well with continue with current management.  He could benefit from losing weight his BMI was 32.5 and suggested that he try to do exercise as tolerated. 2. CHF systolic patient has reduced systolic function.  I would like to get a follow-up echocardiogram if patient's EF is below 4045% we may need to reassess his PAP therapy 3. PAH secondary to multiple factors including sleep apnea heart failure we will continue to monitor 4. Obesity work on diet exercise weight loss 5. Pulmonary nodule results of CT as discussed above.  General Counseling: I have discussed the findings of the evaluation  and examination with Joe Berry.  I have also discussed any further diagnostic evaluation thatmay be needed or ordered today. Joe Berry verbalizes understanding of the findings of todays visit. We also reviewed his medications today and discussed drug interactions and side effects including but not limited excessive drowsiness and altered mental states. We also discussed that there is always a risk not just to him but also people around him. he has been encouraged to call the office with any questions or concerns that should arise related to todays visit.  Orders Placed This Encounter  Procedures  . ECHOCARDIOGRAM COMPLETE    Standing Status:   Future    Standing Expiration Date:   10/18/2021    Order Specific Question:   Where should this test be performed    Answer:   External    Order Specific Question:   Perflutren DEFINITY (image enhancing agent) should be administered unless hypersensitivity or allergy  exist    Answer:   Administer Perflutren    Order Specific Question:   Is a special reader required? (athlete or structural heart)    Answer:   No    Order Specific Question:   Reason for exam-Echo    Answer:   CHF-Acute Systolic  887.57 / V72.82     Time spent: 54mn  I have personally obtained a history, examined the patient, evaluated laboratory and imaging results, formulated the assessment and plan and placed orders.    SAllyne Gee MD FTexas Regional Eye Center Asc LLCPulmonary and Critical Care Sleep medicine

## 2020-10-20 LAB — COMPREHENSIVE METABOLIC PANEL
ALT: 23 IU/L (ref 0–44)
AST: 17 IU/L (ref 0–40)
Albumin/Globulin Ratio: 1.5 (ref 1.2–2.2)
Albumin: 4.4 g/dL (ref 3.8–4.8)
Alkaline Phosphatase: 33 IU/L — ABNORMAL LOW (ref 44–121)
BUN/Creatinine Ratio: 22 (ref 10–24)
BUN: 20 mg/dL (ref 8–27)
Bilirubin Total: 0.5 mg/dL (ref 0.0–1.2)
CO2: 26 mmol/L (ref 20–29)
Calcium: 9.4 mg/dL (ref 8.6–10.2)
Chloride: 96 mmol/L (ref 96–106)
Creatinine, Ser: 0.89 mg/dL (ref 0.76–1.27)
GFR calc Af Amer: 104 mL/min/{1.73_m2} (ref >59)
GFR calc non Af Amer: 90 mL/min/{1.73_m2} (ref >59)
Globulin, Total: 2.9 g/dL (ref 1.5–4.5)
Glucose: 122 mg/dL — ABNORMAL HIGH (ref 65–99)
Potassium: 5 mmol/L (ref 3.5–5.2)
Sodium: 137 mmol/L (ref 134–144)
Total Protein: 7.3 g/dL (ref 6.0–8.5)

## 2020-10-20 LAB — CBC WITH DIFFERENTIAL/PLATELET
Basophils Absolute: 0.1 10*3/uL (ref 0.0–0.2)
Basos: 1 %
EOS (ABSOLUTE): 0.2 10*3/uL (ref 0.0–0.4)
Eos: 3 %
Hematocrit: 43.2 % (ref 37.5–51.0)
Hemoglobin: 14.6 g/dL (ref 13.0–17.7)
Immature Grans (Abs): 0 10*3/uL (ref 0.0–0.1)
Immature Granulocytes: 0 %
Lymphocytes Absolute: 1.7 10*3/uL (ref 0.7–3.1)
Lymphs: 22 %
MCH: 29.1 pg (ref 26.6–33.0)
MCHC: 33.8 g/dL (ref 31.5–35.7)
MCV: 86 fL (ref 79–97)
Monocytes Absolute: 1 10*3/uL — ABNORMAL HIGH (ref 0.1–0.9)
Monocytes: 12 %
Neutrophils Absolute: 5 10*3/uL (ref 1.4–7.0)
Neutrophils: 62 %
Platelets: 276 10*3/uL (ref 150–450)
RBC: 5.02 x10E6/uL (ref 4.14–5.80)
RDW: 12.6 % (ref 11.6–15.4)
WBC: 8 10*3/uL (ref 3.4–10.8)

## 2020-10-20 LAB — TSH+FREE T4
Free T4: 1.16 ng/dL (ref 0.82–1.77)
TSH: 1.97 u[IU]/mL (ref 0.450–4.500)

## 2020-10-20 LAB — LIPID PANEL WITH LDL/HDL RATIO
Cholesterol, Total: 88 mg/dL — ABNORMAL LOW (ref 100–199)
HDL: 35 mg/dL — ABNORMAL LOW (ref >39)
LDL Chol Calc (NIH): 36 mg/dL (ref 0–99)
LDL/HDL Ratio: 1 ratio (ref 0.0–3.6)
Triglycerides: 85 mg/dL (ref 0–149)
VLDL Cholesterol Cal: 17 mg/dL (ref 5–40)

## 2020-10-20 LAB — PSA: Prostate Specific Ag, Serum: 0.9 ng/mL (ref 0.0–4.0)

## 2020-10-20 NOTE — Progress Notes (Signed)
Labs reviewed, will discuss at next follow-up visit.

## 2020-10-25 ENCOUNTER — Other Ambulatory Visit: Payer: Self-pay

## 2020-10-25 ENCOUNTER — Encounter: Payer: Self-pay | Admitting: Hospice and Palliative Medicine

## 2020-10-25 ENCOUNTER — Ambulatory Visit (INDEPENDENT_AMBULATORY_CARE_PROVIDER_SITE_OTHER): Payer: Medicare Other | Admitting: Hospice and Palliative Medicine

## 2020-10-25 VITALS — BP 118/68 | HR 95 | Temp 98.2°F | Resp 16 | Ht 68.0 in | Wt 216.0 lb

## 2020-10-25 DIAGNOSIS — I420 Dilated cardiomyopathy: Secondary | ICD-10-CM

## 2020-10-25 DIAGNOSIS — Z0001 Encounter for general adult medical examination with abnormal findings: Secondary | ICD-10-CM | POA: Diagnosis not present

## 2020-10-25 DIAGNOSIS — E1165 Type 2 diabetes mellitus with hyperglycemia: Secondary | ICD-10-CM

## 2020-10-25 DIAGNOSIS — R300 Dysuria: Secondary | ICD-10-CM | POA: Diagnosis not present

## 2020-10-25 LAB — POCT UA - MICROALBUMIN
Albumin/Creatinine Ratio, Urine, POC: <30
Creatinine, POC: 100 mg/dL
Microalbumin Ur, POC: 30 mg/L

## 2020-10-25 LAB — POCT GLYCOSYLATED HEMOGLOBIN (HGB A1C): Hemoglobin A1C: 6.8 % — AB (ref 4.0–5.6)

## 2020-10-25 NOTE — Progress Notes (Signed)
[facility] [place], [place] [address]  Internal MEDICINE  Office Visit Note  Patient Name: Joe Berry  [id number]  [id number]  Date of Service: 10/26/2020  Chief Complaint  Patient presents with  . Annual Exam    pt doesnt want colonoscopy because he did the cologaurd   . Diabetes  . Sleep Apnea  . Anxiety  . COPD  . policy update form    received  . Quality Metric Gaps    colonoscopy, PNA     HPI Pt is here for routine health maintenance examination Overall things have been going well-he enjoyed spending the holidays with his partners family and grandchildren Has been seen by pulmonology, OSA, COPD and [place] Reviewed his recent labs--normal, lipid panel below normal limits--extensive cardiac history Has not yet had eye exam for this year-does not have visual coverage at this time, will be getting Medicare starting next year and plans to take care of eye exam at that time Followed by cardiology for dilated nonischemic cardiomyopathy, improvement of EF since starting Entresto PHM: [facility] 09/2019-Normal, repeat in 2 years   Current Medication: Outpatient Encounter Medications as of 10/25/2020  Medication Sig  . ACCU-CHEK FASTCLIX LANCETS MISC Use to test blood sugars twice daily e11.65  . aspirin EC 81 MG tablet Take 1 tablet (81 mg total) by mouth daily.  . diphenhydrAMINE HCl (ZZZQUIL) 50 MG/30ML LIQD Take 30 mLs by mouth at bedtime as needed (sleep).  . furosemide (LASIX) 40 MG tablet TAKE 1 TABLET DAILY  . glimepiride (AMARYL) 2 MG tablet TAKE 1 TABLET DAILY BEFORE BREAKFAST  . metFORMIN (GLUCOPHAGE) 500 MG tablet TAKE 1 TABLET THREE TIMES A DAY WITH MEALS  . metoprolol succinate (TOPROL-XL) 50 MG 24 hr tablet TAKE 1 TABLET DAILY. TAKE WITH OR IMMEDIATELY FOLLOWING A MEAL  . potassium chloride (KLOR-CON) 10 MEQ tablet TAKE 1 TABLET DAILY  . sacubitril-valsartan (ENTRESTO) 49-51 MG Take 1 tablet by mouth 2 (two) times daily.  Marland Kitchen  spironolactone (ALDACTONE) 25 MG tablet TAKE 1 TABLET DAILY  . TRULICITY 1.5 HT/9.7FS SOPN INJECT UNDER THE SKIN ONCE WEEKLY IN THE ABDOMEN, THIGH, OR UPPER ARM, ANY TIME OF DAY, WITH OR WITHOUT FOOD.  Marland Kitchen rosuvastatin (CRESTOR) 10 MG tablet Take 1 tablet (10 mg total) by mouth daily.   No facility-administered encounter medications on file as of 10/25/2020.    Surgical History: Past Surgical History:  Procedure Laterality Date  . LEFT HEART CATH AND CORONARY ANGIOGRAPHY Left 04/12/2018   Procedure: LEFT HEART CATH AND CORONARY ANGIOGRAPHY;  Surgeon: Minna Merritts, MD;  Location: [facility];  Service: Cardiovascular;  Laterality: Left;    Medical History: Past Medical History:  Diagnosis Date  . Anxiety   . Cataract   . CHF (congestive heart failure) ([place])   . COPD (chronic obstructive pulmonary disease) ([place])   . Diabetes mellitus without complication ([place])   . Sleep apnea     Family History: Family History  Problem Relation Age of Onset  . Heart disease Mother   . Lung cancer Father   . Heart disease Maternal Grandmother   . Heart Problems Maternal Grandmother   . Heart Problems Brother   . Heart Problems Maternal Grandfather     Review of Systems  Constitutional: Negative for chills, fatigue and unexpected weight change.  HENT: Negative for congestion, postnasal drip, rhinorrhea, sneezing and sore throat.   Eyes: Negative for photophobia, redness and visual disturbance.  Respiratory: Negative for cough, chest tightness  and shortness of breath.   Cardiovascular: Negative for chest pain, palpitations and leg swelling.  Gastrointestinal: Negative for abdominal pain, constipation, diarrhea, nausea and vomiting.  Genitourinary: Negative for dysuria and frequency.  Musculoskeletal: Negative for arthralgias, back pain, joint swelling and neck pain.  Skin: Negative for rash.  Neurological: Negative for tremors and numbness.  Hematological: Negative for adenopathy.  Does not bruise/bleed easily.  Psychiatric/Behavioral: Negative for behavioral problems (Depression), sleep disturbance and suicidal ideas. The patient is not nervous/anxious.      Vital Signs: BP 118/68   Pulse 95   Temp 98.2 F (36.8 C)   Resp 16   Ht '5\' 8"'  (1.727 m)   Wt 216 lb (98 kg)   SpO2 97%   BMI 32.84 kg/m    Physical Exam Vitals reviewed.  Constitutional:      Appearance: Normal appearance. He is obese.  Cardiovascular:     Rate and Rhythm: Normal rate and regular rhythm.     Pulses: Normal pulses.     Heart sounds: Normal heart sounds.  Pulmonary:     Effort: Pulmonary effort is normal.     Breath sounds: Normal breath sounds.  Abdominal:     General: Abdomen is flat.  Musculoskeletal:        General: Normal range of motion.     Cervical back: Normal range of motion.  Skin:    General: Skin is warm.  Neurological:     General: No focal deficit present.     Mental Status: He is alert and oriented to person, place, and time. Mental status is at baseline.  Psychiatric:        Mood and Affect: Mood normal.        Behavior: Behavior normal.        Thought Content: Thought content normal.        Judgment: Judgment normal.    LABS: Recent Results (from the past 2160 hour(s))  CBC w/Diff/Platelet     Status: Abnormal   Collection Time: 10/19/20  8:11 AM  Result Value Ref Range   WBC 8.0 3.4 - 10.8 x10E3/uL   RBC 5.02 4.14 - 5.80 x10E6/uL   Hemoglobin 14.6 13.0 - 17.7 g/dL   Hematocrit 43.2 37.5 - 51.0 %   MCV 86 79 - 97 fL   MCH 29.1 26.6 - 33.0 pg   MCHC 33.8 31 - 35 g/dL   RDW 12.6 11.6 - 15.4 %   Platelets 276 150 - 450 x10E3/uL   Neutrophils 62 Not Estab. %   Lymphs 22 Not Estab. %   Monocytes 12 Not Estab. %   Eos 3 Not Estab. %   Basos 1 Not Estab. %   Neutrophils Absolute 5.0 1.40 - 7.00 x10E3/uL   Lymphocytes Absolute 1.7 0 - 3 x10E3/uL   Monocytes Absolute 1.0 (H) 0 - 0 x10E3/uL   EOS (ABSOLUTE) 0.2 0.0 - 0.4 x10E3/uL   Basophils  Absolute 0.1 0 - 0 x10E3/uL   Immature Granulocytes 0 Not Estab. %   Immature Grans (Abs) 0.0 0.0 - 0.1 x10E3/uL  Comprehensive metabolic panel     Status: Abnormal   Collection Time: 10/19/20  8:11 AM  Result Value Ref Range   Glucose 122 (H) 65 - 99 mg/dL   BUN 20 8 - 27 mg/dL   Creatinine, Ser 0.89 0.76 - 1.27 mg/dL   GFR calc non Af Amer 90 >59 mL/min/1.73   GFR calc Af Amer 104 >59 mL/min/1.73    Comment: **In  accordance with recommendations from the NKF-ASN Task force,**   [facility] is in the process of updating its eGFR calculation to the   2021 CKD-EPI creatinine equation that estimates kidney function   without a race variable.    BUN/Creatinine Ratio 22 10 - 24   Sodium 137 134 - 144 mmol/L   Potassium 5.0 3.5 - 5.2 mmol/L   Chloride 96 96 - 106 mmol/L   CO2 26 20 - 29 mmol/L   Calcium 9.4 8.6 - 10.2 mg/dL   Total Protein 7.3 6.0 - 8.5 g/dL   Albumin 4.4 3.8 - 4.8 g/dL   Globulin, Total 2.9 1.5 - 4.5 g/dL   Albumin/Globulin Ratio 1.5 1.2 - 2.2   Bilirubin Total 0.5 0.0 - 1.2 mg/dL   Alkaline Phosphatase 33 (L) 44 - 121 IU/L    Comment:               **Please note reference interval change**   AST 17 0 - 40 IU/L   ALT 23 0 - 44 IU/L  Lipid Panel With LDL/HDL Ratio     Status: Abnormal   Collection Time: 10/19/20  8:11 AM  Result Value Ref Range   Cholesterol, Total 88 (L) 100 - 199 mg/dL   Triglycerides 85 0 - 149 mg/dL   HDL 35 (L) >39 mg/dL   VLDL Cholesterol Cal 17 5 - 40 mg/dL   LDL Chol Calc (NIH) 36 0 - 99 mg/dL   LDL/HDL Ratio 1.0 0.0 - 3.6 ratio    Comment:                                     LDL/HDL Ratio                                             Men  Women                               1/2 Avg.Risk  1.0    1.5                                   Avg.Risk  3.6    3.2                                2X Avg.Risk  6.2    5.0                                3X Avg.Risk  8.0    6.1   TSH + free T4     Status: None   Collection Time: 10/19/20  8:11 AM  Result  Value Ref Range   TSH 1.970 0.450 - 4.500 uIU/mL   Free T4 1.16 0.82 - 1.77 ng/dL  PSA     Status: None   Collection Time: 10/19/20  8:11 AM  Result Value Ref Range   Prostate Specific Ag, Serum 0.9 0.0 - 4.0 ng/mL    Comment: Roche ECLIA methodology. According to the American Urological Association, Serum PSA should decrease and remain at  undetectable levels after radical prostatectomy. The AUA defines biochemical recurrence as an initial PSA value 0.2 ng/mL or greater followed by a subsequent confirmatory PSA value 0.2 ng/mL or greater. Values obtained with different assay methods or kits cannot be used interchangeably. Results cannot be interpreted as absolute evidence of the presence or absence of malignant disease.   POCT HgB A1C     Status: Abnormal   Collection Time: 10/25/20  9:28 AM  Result Value Ref Range   Hemoglobin A1C 6.8 (A) 4.0 - 5.6 %   HbA1c POC (<> result, manual entry)     HbA1c, POC (prediabetic range)     HbA1c, POC (controlled diabetic range)    POCT UA - Microalbumin     Status: None   Collection Time: 10/25/20  9:28 AM  Result Value Ref Range   Microalbumin Ur, POC 30 mg/L   Creatinine, POC 100 mg/dL   Albumin/Creatinine Ratio, Urine, POC <30   UA/M w/rflx Culture, Routine     Status: Abnormal   Collection Time: 10/25/20 12:17 PM   Specimen: Urine   Urine  Result Value Ref Range   Specific Gravity, UA 1.020 1.005 - 1.030   pH, UA 6.5 5.0 - 7.5   Color, UA Yellow Yellow   Appearance Ur Clear Clear   Leukocytes,UA Negative Negative   Protein,UA Negative Negative/Trace   Glucose, UA 2+ (A) Negative   Ketones, UA Negative Negative   RBC, UA Negative Negative   Bilirubin, UA Negative Negative   Urobilinogen, Ur 1.0 0.2 - 1.0 mg/dL   Nitrite, UA Negative Negative   Microscopic Examination Comment     Comment: Microscopic follows if indicated.   Microscopic Examination See below:     Comment: Microscopic was indicated and was performed.    Urinalysis Reflex Comment     Comment: This specimen will not reflex to a Urine Culture.  Microscopic Examination     Status: None   Collection Time: 10/25/20 12:17 PM   Urine  Result Value Ref Range   WBC, UA None seen 0 - 5 /hpf   RBC None seen 0 - 2 /hpf   Epithelial Cells (non renal) None seen 0 - 10 /hpf   Casts None seen None seen /lpf   Bacteria, UA None seen None seen/Few   Assessment/Plan: 1. Encounter for routine adult health examination with abnormal findings Well appearing 64 year old male, reviewed annual labs-remain stable Up to date on PHM--will follow-up on eye exam in the New Year  2. Uncontrolled type 2 diabetes mellitus with hyperglycemia (HCC) A1C 6.8 today, continues to improve on current therapy Microalbumin 30--continue to monitor kidney functions - POCT HgB A1C - POCT UA - Microalbumin  3. Dilated cardiomyopathy ([place]) Continue with current therapy and routine follow-up with cardiology  4. Dysuria - UA/M w/rflx Culture, Routine - Microscopic Examination  General Counseling: Trai verbalizes understanding of the findings of todays visit and agrees with plan of treatment. I have discussed any further diagnostic evaluation that may be needed or ordered today. We also reviewed his medications today. he has been encouraged to call the office with any questions or concerns that should arise related to todays visit.    Counseling:    Orders Placed This Encounter  Procedures  . Microscopic Examination  . UA/M w/rflx Culture, Routine  . POCT HgB A1C  . POCT UA - Microalbumin      Total time spent: 30 Minutes  Time spent includes review of chart, medications, test  results, and follow up plan with the patient.   This patient was seen by Casey Burkitt AGNP-C Collaboration with Dr Lavera Guise as a part of collaborative care agreement   Tanna Furry. [facility] Internal Medicine

## 2020-10-25 NOTE — Patient Instructions (Signed)
Diabetes Mellitus and Foot Care Foot care is an important part of your health, especially when you have diabetes. Diabetes may cause you to have problems because of poor blood flow (circulation) to your feet and legs, which can cause your skin to:  Become thinner and drier.  Break more easily.  Heal more slowly.  Peel and crack. You may also have nerve damage (neuropathy) in your legs and feet, causing decreased feeling in them. This means that you may not notice minor injuries to your feet that could lead to more serious problems. Noticing and addressing any potential problems early is the best way to prevent future foot problems. How to care for your feet Foot hygiene  Wash your feet daily with warm water and mild soap. Do not use hot water. Then, pat your feet and the areas between your toes until they are completely dry. Do not soak your feet as this can dry your skin.  Trim your toenails straight across. Do not dig under them or around the cuticle. File the edges of your nails with an emery board or nail file.  Apply a moisturizing lotion or petroleum jelly to the skin on your feet and to dry, brittle toenails. Use lotion that does not contain alcohol and is unscented. Do not apply lotion between your toes. Shoes and socks  Wear clean socks or stockings every day. Make sure they are not too tight. Do not wear knee-high stockings since they may decrease blood flow to your legs.  Wear shoes that fit properly and have enough cushioning. Always look in your shoes before you put them on to be sure there are no objects inside.  To break in new shoes, wear them for just a few hours a day. This prevents injuries on your feet. Wounds, scrapes, corns, and calluses  Check your feet daily for blisters, cuts, bruises, sores, and redness. If you cannot see the bottom of your feet, use a mirror or ask someone for help.  Do not cut corns or calluses or try to remove them with medicine.  If you  find a minor scrape, cut, or break in the skin on your feet, keep it and the skin around it clean and dry. You may clean these areas with mild soap and water. Do not clean the area with peroxide, alcohol, or iodine.  If you have a wound, scrape, corn, or callus on your foot, look at it several times a day to make sure it is healing and not infected. Check for: ? Redness, swelling, or pain. ? Fluid or blood. ? Warmth. ? Pus or a bad smell. General instructions  Do not cross your legs. This may decrease blood flow to your feet.  Do not use heating pads or hot water bottles on your feet. They may burn your skin. If you have lost feeling in your feet or legs, you may not know this is happening until it is too late.  Protect your feet from hot and cold by wearing shoes, such as at the beach or on hot pavement.  Schedule a complete foot exam at least once a year (annually) or more often if you have foot problems. If you have foot problems, report any cuts, sores, or bruises to your health care provider immediately. Contact a health care provider if:  You have a medical condition that increases your risk of infection and you have any cuts, sores, or bruises on your feet.  You have an injury that is not   healing.  You have redness on your legs or feet.  You feel burning or tingling in your legs or feet.  You have pain or cramps in your legs and feet.  Your legs or feet are numb.  Your feet always feel cold.  You have pain around a toenail. Get help right away if:  You have a wound, scrape, corn, or callus on your foot and: ? You have pain, swelling, or redness that gets worse. ? You have fluid or blood coming from the wound, scrape, corn, or callus. ? Your wound, scrape, corn, or callus feels warm to the touch. ? You have pus or a bad smell coming from the wound, scrape, corn, or callus. ? You have a fever. ? You have a red line going up your leg. Summary  Check your feet every day  for cuts, sores, red spots, swelling, and blisters.  Moisturize feet and legs daily.  Wear shoes that fit properly and have enough cushioning.  If you have foot problems, report any cuts, sores, or bruises to your health care provider immediately.  Schedule a complete foot exam at least once a year (annually) or more often if you have foot problems. This information is not intended to replace advice given to you by your health care provider. Make sure you discuss any questions you have with your health care provider. Document Revised: 07/30/2019 Document Reviewed: 12/08/2016 Elsevier Patient Education  2020 Elsevier Inc.  

## 2020-10-26 ENCOUNTER — Encounter: Payer: Self-pay | Admitting: Hospice and Palliative Medicine

## 2020-10-26 LAB — UA/M W/RFLX CULTURE, ROUTINE
Bilirubin, UA: NEGATIVE
Ketones, UA: NEGATIVE
Leukocytes,UA: NEGATIVE
Nitrite, UA: NEGATIVE
Protein,UA: NEGATIVE
RBC, UA: NEGATIVE
Specific Gravity, UA: 1.02 (ref 1.005–1.030)
Urobilinogen, Ur: 1 mg/dL (ref 0.2–1.0)
pH, UA: 6.5 (ref 5.0–7.5)

## 2020-10-26 LAB — MICROSCOPIC EXAMINATION
Bacteria, UA: NONE SEEN
Casts: NONE SEEN /lpf
Epithelial Cells (non renal): NONE SEEN /hpf (ref 0–10)
RBC, Urine: NONE SEEN /hpf (ref 0–2)
WBC, UA: NONE SEEN /hpf (ref 0–5)

## 2020-11-01 ENCOUNTER — Other Ambulatory Visit: Payer: Self-pay | Admitting: Cardiovascular Disease

## 2020-11-01 NOTE — Telephone Encounter (Signed)
Rx request sent to pharmacy.  

## 2020-11-24 ENCOUNTER — Other Ambulatory Visit: Payer: Self-pay

## 2020-11-24 MED ORDER — ROSUVASTATIN CALCIUM 10 MG PO TABS
10.0000 mg | ORAL_TABLET | Freq: Every day | ORAL | 1 refills | Status: DC
Start: 2020-11-24 — End: 2021-04-29

## 2020-11-24 MED ORDER — METOPROLOL SUCCINATE ER 50 MG PO TB24
ORAL_TABLET | ORAL | 1 refills | Status: DC
Start: 2020-11-24 — End: 2021-04-29

## 2020-11-24 MED ORDER — POTASSIUM CHLORIDE CRYS ER 10 MEQ PO TBCR
10.0000 meq | EXTENDED_RELEASE_TABLET | Freq: Every day | ORAL | 1 refills | Status: DC
Start: 2020-11-24 — End: 2021-09-26

## 2020-11-25 ENCOUNTER — Other Ambulatory Visit: Payer: Self-pay

## 2020-11-25 DIAGNOSIS — E1165 Type 2 diabetes mellitus with hyperglycemia: Secondary | ICD-10-CM

## 2020-11-25 MED ORDER — METFORMIN HCL 500 MG PO TABS
ORAL_TABLET | ORAL | 1 refills | Status: DC
Start: 2020-11-25 — End: 2020-11-26

## 2020-11-25 MED ORDER — GLIMEPIRIDE 2 MG PO TABS: 2.0000 mg | ORAL_TABLET | Freq: Every day | ORAL | 3 refills | Status: DC

## 2020-11-26 ENCOUNTER — Other Ambulatory Visit: Payer: Self-pay

## 2020-11-26 DIAGNOSIS — E1165 Type 2 diabetes mellitus with hyperglycemia: Secondary | ICD-10-CM

## 2020-11-26 MED ORDER — METFORMIN HCL 500 MG PO TABS
ORAL_TABLET | ORAL | 1 refills | Status: DC
Start: 2020-11-26 — End: 2021-05-27

## 2020-11-26 MED ORDER — GLIMEPIRIDE 2 MG PO TABS: 2.0000 mg | ORAL_TABLET | Freq: Every day | ORAL | 1 refills | Status: DC

## 2020-11-29 ENCOUNTER — Telehealth: Payer: Self-pay

## 2020-11-29 NOTE — Telephone Encounter (Signed)
Confirmed and screened for 12-01-20 [place].Toni Amend

## 2020-12-01 ENCOUNTER — Other Ambulatory Visit: Payer: Self-pay

## 2020-12-01 ENCOUNTER — Ambulatory Visit: Payer: Medicare Other

## 2020-12-01 DIAGNOSIS — I5022 Chronic systolic (congestive) heart failure: Secondary | ICD-10-CM

## 2020-12-01 DIAGNOSIS — I509 Heart failure, unspecified: Secondary | ICD-10-CM | POA: Diagnosis not present

## 2020-12-03 ENCOUNTER — Other Ambulatory Visit: Payer: Medicare Other

## 2020-12-17 LAB — COLOGUARD

## 2020-12-22 ENCOUNTER — Telehealth: Payer: Self-pay

## 2020-12-22 NOTE — Telephone Encounter (Signed)
Spoke to [employer], resubmitted codes for the lipid panel and prostate-specific Ag, they will resubmit to the insurance and it can take 2-6 weeks before pt will get an updated statement allowing him to know if those tests are now covered by the insurance. Spoke to pt, informed him of what is going on and when he is expected to receive updated statement.

## 2021-01-07 ENCOUNTER — Telehealth: Payer: Self-pay | Admitting: Cardiovascular Disease

## 2021-01-07 ENCOUNTER — Other Ambulatory Visit: Payer: Self-pay | Admitting: *Deleted

## 2021-01-07 MED ORDER — FUROSEMIDE 40 MG PO TABS: 40.0000 mg | ORAL_TABLET | Freq: Every day | ORAL | 2 refills | Status: DC

## 2021-01-07 MED ORDER — SPIRONOLACTONE 25 MG PO TABS: 25.0000 mg | ORAL_TABLET | Freq: Every day | ORAL | 3 refills | Status: DC

## 2021-01-07 NOTE — Telephone Encounter (Signed)
Return pt's phone call, was able to send in pt's medication of spirolactone to [facility] [place] in [facility] [place] through [place]. Printed form for PA with [employer], pt will pick up form Monday. Once completed and all requirements are met, will have Dr. Rockey Situ sign and then fax to [employer]. Nothing further at this time, pt PA form is up front for pt pick up. Pt will call back with any other concerns.

## 2021-01-07 NOTE — Telephone Encounter (Signed)
Patient calling in stating that he has changed pharmacy company this year. All future medications need to be sent to [facility]   *STAT* If patient is at the pharmacy, call can be transferred to refill team.   1. Which medications need to be refilled? (please list name of each medication and dose if known) spirolactone  2. Which pharmacy/location (including street and city if local pharmacy) is medication to be sent to?[facility]  3. Do they need a 30 day or 90 day supply? 90  Patient is also in need of patient assistance for entresto. Patient was fine while covered by [employer] but due to the medicare donut hole, patient needs help  Please advise

## 2021-01-24 ENCOUNTER — Encounter: Payer: Self-pay | Admitting: Hospice and Palliative Medicine

## 2021-01-24 ENCOUNTER — Ambulatory Visit (INDEPENDENT_AMBULATORY_CARE_PROVIDER_SITE_OTHER): Payer: Medicare Other | Admitting: Hospice and Palliative Medicine

## 2021-01-24 ENCOUNTER — Other Ambulatory Visit: Payer: Self-pay

## 2021-01-24 VITALS — BP 128/76 | HR 78 | Ht 68.0 in | Wt 218.4 lb

## 2021-01-24 DIAGNOSIS — K219 Gastro-esophageal reflux disease without esophagitis: Secondary | ICD-10-CM | POA: Diagnosis not present

## 2021-01-24 DIAGNOSIS — I420 Dilated cardiomyopathy: Secondary | ICD-10-CM

## 2021-01-24 DIAGNOSIS — E1165 Type 2 diabetes mellitus with hyperglycemia: Secondary | ICD-10-CM | POA: Diagnosis not present

## 2021-01-24 DIAGNOSIS — G4733 Obstructive sleep apnea (adult) (pediatric): Secondary | ICD-10-CM | POA: Diagnosis not present

## 2021-01-24 DIAGNOSIS — I10 Essential (primary) hypertension: Secondary | ICD-10-CM

## 2021-01-24 LAB — POCT GLYCOSYLATED HEMOGLOBIN (HGB A1C): Hemoglobin A1C: 6.9 % — AB (ref 4.0–5.6)

## 2021-01-24 MED ORDER — OMEPRAZOLE 20 MG PO CPDR: 20.0000 mg | DELAYED_RELEASE_CAPSULE | Freq: Every day | ORAL | 1 refills | Status: DC

## 2021-01-24 NOTE — Progress Notes (Signed)
[facility] [address] [place], [place] [address]  Internal MEDICINE  Office Visit Note  Patient Name: Joe Berry  [id number]  [id number]  Date of Service: 01/24/2021  Chief Complaint  Patient presents with  . Diabetes  . Anxiety  . COPD    HPI Patient is here for routine follow-up GERD-has been taking 2 OTC pepcid everyday for several months, Pepcid has been controlling symptoms well, knows trigger foods but has not been avoiding them as he should DM-Concerned A1C will be elevated today as he has been slack on healthy eating since the holidays  Wears CPAP as often as he can--unable to sleep in bed due to wearing CPAP due to  Increased movement and breaking seal multiple times through the night  Current Medication: Outpatient Encounter Medications as of 01/24/2021  Medication Sig  . omeprazole (PRILOSEC) 20 MG capsule Take 1 capsule (20 mg total) by mouth daily.  Marland Kitchen ACCU-CHEK FASTCLIX LANCETS MISC Use to test blood sugars twice daily e11.65  . aspirin EC 81 MG tablet Take 1 tablet (81 mg total) by mouth daily.  . diphenhydrAMINE HCl (ZZZQUIL) 50 MG/30ML LIQD Take 30 mLs by mouth at bedtime as needed (sleep).  . ENTRESTO 49-51 MG TAKE 1 TABLET TWICE A DAY (DOSE INCREASE)  . furosemide (LASIX) 40 MG tablet Take 1 tablet (40 mg total) by mouth daily.  Marland Kitchen glimepiride (AMARYL) 2 MG tablet Take 1 tablet (2 mg total) by mouth daily before breakfast.  . metFORMIN (GLUCOPHAGE) 500 MG tablet TAKE 1 TABLET THREE TIMES A DAY WITH MEALS  . metoprolol succinate (TOPROL-XL) 50 MG 24 hr tablet TAKE 1 TABLET DAILY. TAKE WITH OR IMMEDIATELY FOLLOWING A MEAL  . potassium chloride (KLOR-CON) 10 MEQ tablet Take 1 tablet (10 mEq total) by mouth daily.  . rosuvastatin (CRESTOR) 10 MG tablet Take 1 tablet (10 mg total) by mouth daily.  Marland Kitchen spironolactone (ALDACTONE) 25 MG tablet Take 1 tablet (25 mg total) by mouth daily.  . TRULICITY 1.5 MG/0.5ML SOPN INJECT UNDER THE SKIN ONCE WEEKLY IN THE  ABDOMEN, THIGH, OR UPPER ARM, ANY TIME OF DAY, WITH OR WITHOUT FOOD.   No facility-administered encounter medications on file as of 01/24/2021.    Surgical History: Past Surgical History:  Procedure Laterality Date  . LEFT HEART CATH AND CORONARY ANGIOGRAPHY Left 04/12/2018   Procedure: LEFT HEART CATH AND CORONARY ANGIOGRAPHY;  Surgeon: Antonieta Iba, MD;  Location: [facility] INVASIVE CV LAB;  Service: Cardiovascular;  Laterality: Left;    Medical History: Past Medical History:  Diagnosis Date  . Anxiety   . Cataract   . CHF (congestive heart failure) (HCC)   . COPD (chronic obstructive pulmonary disease) (HCC)   . Diabetes mellitus without complication (HCC)   . Sleep apnea     Family History: Family History  Problem Relation Age of Onset  . Heart disease Mother   . Lung cancer Father   . Heart disease Maternal Grandmother   . Heart Problems Maternal Grandmother   . Heart Problems Brother   . Heart Problems Maternal Grandfather     Social History   Socioeconomic History  . Marital status: Single    Spouse name: Not on file  . Number of children: Not on file  . Years of education: Not on file  . Highest education level: Not on file  Occupational History  . Not on file  Tobacco Use  . Smoking status: Former Smoker    Packs/day: 1.50  Years: 40.00    Pack years: 60.00    Types: Cigarettes  . Smokeless tobacco: Never Used  . Tobacco comment: quit 2008  Vaping Use  . Vaping Use: Never used  Substance and Sexual Activity  . Alcohol use: Yes    Alcohol/week: 1.0 standard drink    Types: 1 Shots of liquor per week    Comment: occasional drink  . Drug use: Never  . Sexual activity: Not on file  Other Topics Concern  . Not on file  Social History Narrative  . Not on file   Social Determinants of Health   Financial Resource Strain: Not on file  Food Insecurity: Not on file  Transportation Needs: Not on file  Physical Activity: Not on file  Stress: Not on  file  Social Connections: Not on file  Intimate Partner Violence: Not on file      Review of Systems  Constitutional: Negative for chills, fatigue and unexpected weight change.  HENT: Negative for congestion, postnasal drip, rhinorrhea, sneezing and sore throat.   Eyes: Negative for redness.  Respiratory: Negative for cough, chest tightness and shortness of breath.   Cardiovascular: Negative for chest pain and palpitations.  Gastrointestinal: Negative for abdominal pain, constipation, diarrhea, nausea and vomiting.  Genitourinary: Negative for dysuria and frequency.  Musculoskeletal: Negative for arthralgias, back pain, joint swelling and neck pain.  Skin: Negative for rash.  Neurological: Negative for tremors and numbness.  Hematological: Negative for adenopathy. Does not bruise/bleed easily.  Psychiatric/Behavioral: Negative for behavioral problems (Depression), sleep disturbance and suicidal ideas. The patient is not nervous/anxious.     Vital Signs: BP 128/76   Pulse 78   Wt 218 lb 6.4 oz (99.1 kg)   SpO2 97%   BMI 33.21 kg/m    Physical Exam Vitals reviewed.  Constitutional:      Appearance: Normal appearance. He is normal weight.  Cardiovascular:     Rate and Rhythm: Normal rate and regular rhythm.     Pulses: Normal pulses.     Heart sounds: Normal heart sounds.  Pulmonary:     Effort: Pulmonary effort is normal.     Breath sounds: Normal breath sounds.  Abdominal:     General: Abdomen is flat.     Palpations: Abdomen is soft.  Musculoskeletal:        General: Normal range of motion.     Cervical back: Normal range of motion.  Skin:    General: Skin is warm.  Neurological:     General: No focal deficit present.     Mental Status: He is alert and oriented to person, place, and time. Mental status is at baseline.  Psychiatric:        Mood and Affect: Mood normal.        Behavior: Behavior normal.        Thought Content: Thought content normal.         Judgment: Judgment normal.    Assessment/Plan: 1. Uncontrolled type 2 diabetes mellitus with hyperglycemia (HCC) A1C 6.9--continue with current regimen, emphasized importance of adhering to low carb and low sugar diet Plans to call eye doctor to schedule appointment now that he has Medicare coverage - POCT HgB A1C  2. Gastroesophageal reflux disease without esophagitis Start omeprazole daily Discussed avoid trigger foods, avoiding lying flat up to 30 minutes after eating, increasing water intake and avoiding excessive caffeine or carbonation May consider UGI/upper endoscopy if symptoms persist  - omeprazole (PRILOSEC) 20 MG capsule; Take 1 capsule (20  mg total) by mouth daily.  Dispense: 90 capsule; Refill: 1  3. OSA (obstructive sleep apnea) Encouraged nightly compliance with CPAP, followed by pulmonology  4. Dilated cardiomyopathy (HCC) Followed by cardiology, controlled  5. Essential hypertension BP and HR well controlled, continue to monitor  General Counseling: lyrik buresh understanding of the findings of todays visit and agrees with plan of treatment. I have discussed any further diagnostic evaluation that may be needed or ordered today. We also reviewed his medications today. he has been encouraged to call the office with any questions or concerns that should arise related to todays visit.    Orders Placed This Encounter  Procedures  . POCT HgB A1C    Meds ordered this encounter  Medications  . omeprazole (PRILOSEC) 20 MG capsule    Sig: Take 1 capsule (20 mg total) by mouth daily.    Dispense:  90 capsule    Refill:  1    Time spent: 30 Minutes Time spent includes review of chart, medications, test results and follow-up plan with the patient.  This patient was seen by Leeanne Deed AGNP-C in Collaboration with Dr Lyndon Code as a part of collaborative care agreement     Lubertha Basque. Francies Inch AGNP-C Internal medicine

## 2021-01-25 NOTE — Telephone Encounter (Signed)
Mr. Buckles PA for Sherryll Burger was faxed to [employer]. Gr. Mariah Milling has signed application, pt's med list and insurance info faxed along with application.

## 2021-01-25 NOTE — Telephone Encounter (Signed)
Patient dropped off PAF forms Placed in nurse box

## 2021-02-21 ENCOUNTER — Ambulatory Visit (INDEPENDENT_AMBULATORY_CARE_PROVIDER_SITE_OTHER): Payer: Medicare Other | Admitting: Internal Medicine

## 2021-02-21 ENCOUNTER — Other Ambulatory Visit: Payer: Self-pay

## 2021-02-21 ENCOUNTER — Encounter: Payer: Self-pay | Admitting: Internal Medicine

## 2021-02-21 VITALS — BP 120/70 | HR 83 | Temp 97.9°F | Resp 16 | Ht 68.0 in | Wt 218.0 lb

## 2021-02-21 DIAGNOSIS — Z6833 Body mass index (BMI) 33.0-33.9, adult: Secondary | ICD-10-CM

## 2021-02-21 DIAGNOSIS — G4733 Obstructive sleep apnea (adult) (pediatric): Secondary | ICD-10-CM

## 2021-02-21 DIAGNOSIS — I5189 Other ill-defined heart diseases: Secondary | ICD-10-CM

## 2021-02-21 DIAGNOSIS — Z7189 Other specified counseling: Secondary | ICD-10-CM

## 2021-02-21 MED ORDER — PNEUMOCOCCAL 13-VAL CONJ VACC IM SUSP: 0.5000 mL | INTRAMUSCULAR | 0 refills | Status: AC

## 2021-02-21 NOTE — Progress Notes (Signed)
[facility] [address] [place], [place] [address]  Internal MEDICINE  Office Visit Note  Patient Name: Joe Berry  [id number]  [id number]  Date of Service: 02/28/2021  Chief Complaint  Patient presents with  . Sleep Apnea  . Follow-up    Echo     HPI Pt is here for OSA follow up.tries his best to use his CPAP machine still not 100% compliance. Recent echo showed normal LV function with diastolic dysfunction, his overnight pulse oximetry did show hypoxia last year, does not wish to be on home O2. No other problems.  Followed by cardiology Diabetes is under good control    Current Medication: Outpatient Encounter Medications as of 02/21/2021  Medication Sig  . ACCU-CHEK FASTCLIX LANCETS MISC Use to test blood sugars twice daily e11.65  . aspirin EC 81 MG tablet Take 1 tablet (81 mg total) by mouth daily.  . diphenhydrAMINE HCl 50 MG/30ML LIQD Take 30 mLs by mouth at bedtime as needed (sleep).  . ENTRESTO 49-51 MG TAKE 1 TABLET TWICE A DAY (DOSE INCREASE)  . furosemide (LASIX) 40 MG tablet Take 1 tablet (40 mg total) by mouth daily.  Marland Kitchen glimepiride (AMARYL) 2 MG tablet Take 1 tablet (2 mg total) by mouth daily before breakfast.  . metFORMIN (GLUCOPHAGE) 500 MG tablet TAKE 1 TABLET THREE TIMES A DAY WITH MEALS  . metoprolol succinate (TOPROL-XL) 50 MG 24 hr tablet TAKE 1 TABLET DAILY. TAKE WITH OR IMMEDIATELY FOLLOWING A MEAL  . omeprazole (PRILOSEC) 20 MG capsule Take 1 capsule (20 mg total) by mouth daily.  . potassium chloride (KLOR-CON) 10 MEQ tablet Take 1 tablet (10 mEq total) by mouth daily.  . rosuvastatin (CRESTOR) 10 MG tablet Take 1 tablet (10 mg total) by mouth daily.  Marland Kitchen spironolactone (ALDACTONE) 25 MG tablet Take 1 tablet (25 mg total) by mouth daily.  . TRULICITY 1.5 MG/0.5ML SOPN INJECT UNDER THE SKIN ONCE WEEKLY IN THE ABDOMEN, THIGH, OR UPPER ARM, ANY TIME OF DAY, WITH OR WITHOUT FOOD.   No facility-administered encounter medications on file as of  02/21/2021.    Surgical History: Past Surgical History:  Procedure Laterality Date  . LEFT HEART CATH AND CORONARY ANGIOGRAPHY Left 04/12/2018   Procedure: LEFT HEART CATH AND CORONARY ANGIOGRAPHY;  Surgeon: Antonieta Iba, MD;  Location: [facility] INVASIVE CV LAB;  Service: Cardiovascular;  Laterality: Left;    Medical History: Past Medical History:  Diagnosis Date  . Anxiety   . Cataract   . CHF (congestive heart failure) (HCC)   . COPD (chronic obstructive pulmonary disease) (HCC)   . Diabetes mellitus without complication (HCC)   . Sleep apnea     Family History: Family History  Problem Relation Age of Onset  . Heart disease Mother   . Lung cancer Father   . Heart disease Maternal Grandmother   . Heart Problems Maternal Grandmother   . Heart Problems Brother   . Heart Problems Maternal Grandfather     Social History   Socioeconomic History  . Marital status: Single    Spouse name: Not on file  . Number of children: Not on file  . Years of education: Not on file  . Highest education level: Not on file  Occupational History  . Not on file  Tobacco Use  . Smoking status: Former Smoker    Packs/day: 1.50    Years: 40.00    Pack years: 60.00    Types: Cigarettes  . Smokeless tobacco: Never Used  .  Tobacco comment: quit 2008  Vaping Use  . Vaping Use: Never used  Substance and Sexual Activity  . Alcohol use: Yes    Alcohol/week: 1.0 standard drink    Types: 1 Shots of liquor per week    Comment: occasional drink  . Drug use: Never  . Sexual activity: Not on file  Other Topics Concern  . Not on file  Social History Narrative  . Not on file   Social Determinants of Health   Financial Resource Strain: Not on file  Food Insecurity: Not on file  Transportation Needs: Not on file  Physical Activity: Not on file  Stress: Not on file  Social Connections: Not on file  Intimate Partner Violence: Not on file      Review of Systems  Constitutional: Negative  for chills, fatigue and unexpected weight change.  HENT: Negative for congestion, postnasal drip, rhinorrhea, sneezing and sore throat.   Eyes: Negative for redness.  Respiratory: Negative for cough, chest tightness and shortness of breath.   Cardiovascular: Negative for chest pain and palpitations.  Gastrointestinal: Negative for abdominal pain, constipation, diarrhea, nausea and vomiting.  Genitourinary: Negative for dysuria and frequency.  Musculoskeletal: Negative for arthralgias, back pain, joint swelling and neck pain.  Skin: Negative for rash.  Neurological: Negative.  Negative for tremors and numbness.  Hematological: Negative for adenopathy. Does not bruise/bleed easily.  Psychiatric/Behavioral: Negative for behavioral problems (Depression), sleep disturbance and suicidal ideas. The patient is not nervous/anxious.     Vital Signs: BP 120/70   Pulse 83   Temp 97.9 F (36.6 C)   Resp 16   Ht 5\' 8"  (1.727 m)   Wt 218 lb (98.9 kg)   SpO2 99%   BMI 33.15 kg/m    Physical Exam Constitutional:      General: He is not in acute distress.    Appearance: He is well-developed. He is not diaphoretic.  HENT:     Head: Normocephalic and atraumatic.     Mouth/Throat:     Pharynx: No oropharyngeal exudate.  Eyes:     Pupils: Pupils are equal, round, and reactive to light.  Neck:     Thyroid: No thyromegaly.     Vascular: No JVD.     Trachea: No tracheal deviation.  Cardiovascular:     Rate and Rhythm: Normal rate and regular rhythm.     Heart sounds: Normal heart sounds. No murmur heard. No friction rub. No gallop.   Pulmonary:     Effort: Pulmonary effort is normal. No respiratory distress.     Breath sounds: No wheezing or rales.  Chest:     Chest wall: No tenderness.  Neurological:     Mental Status: He is alert and oriented to person, place, and time.     Cranial Nerves: No cranial nerve deficit.  Psychiatric:        Behavior: Behavior normal.        Thought  Content: Thought content normal.        Judgment: Judgment normal.        Assessment/Plan: 1. OSA (obstructive sleep apnea) Pt is instructed to continue to use CPAP  2. Encounter for counseling on use of CPAP encouraged compliance, proper technique and hurdles, seen in sleep clinic. Use of O2 at night   3. BMI 33.0-33.9,adult Obesity Counseling: Risk Assessment: An assessment of behavioral risk factors was made today and includes lack of exercise sedentary lifestyle, lack of portion control and poor dietary habits.  Risk Modification  Advice: She was counseled on portion control guidelines. Restricting daily caloric intake to 1800 The detrimental long term effects of obesity on her health and ongoing poor compliance was also discussed with the patient.  4. Diastolic dysfunction without heart failure Echo results discussed with pt. Control BP, lose wt and better glycemic control will help with this, will continue to monitor   General Counseling: valmore arabie understanding of the findings of todays visit and agrees with plan of treatment. I have discussed any further diagnostic evaluation that may be needed or ordered today. We also reviewed his medications today. he has been encouraged to call the office with any questions or concerns that should arise related to todays visit.   Total time spent:  Minutes Time spent includes review of chart, medications, test results, and follow up plan with the patient.   [place] Cont[place] Substance Database was reviewed by me.   Dr Lyndon Code Internal medicine

## 2021-03-24 ENCOUNTER — Other Ambulatory Visit: Payer: Self-pay | Admitting: Hospice and Palliative Medicine

## 2021-03-24 ENCOUNTER — Ambulatory Visit (INDEPENDENT_AMBULATORY_CARE_PROVIDER_SITE_OTHER): Payer: Medicare Other | Admitting: Hospice and Palliative Medicine

## 2021-03-24 ENCOUNTER — Encounter: Payer: Self-pay | Admitting: Hospice and Palliative Medicine

## 2021-03-24 VITALS — BP 120/76 | HR 96 | Temp 99.1°F | Resp 16 | Ht 68.0 in | Wt 212.0 lb

## 2021-03-24 DIAGNOSIS — I10 Essential (primary) hypertension: Secondary | ICD-10-CM

## 2021-03-24 DIAGNOSIS — U071 COVID-19: Secondary | ICD-10-CM | POA: Diagnosis not present

## 2021-03-24 MED ORDER — AZITHROMYCIN 250 MG PO TABS: ORAL_TABLET | ORAL | 0 refills | Status: AC

## 2021-03-24 NOTE — Progress Notes (Signed)
[facility] [address] [place], [place] [address]  Internal MEDICINE  Telephone Visit  Patient Name: Joe Berry  [id number]  [id number]  Date of Service: 03/25/2021  I connected with the patient at 1055 by telephone and verified the patients identity using two identifiers.   I discussed the limitations, risks, security and privacy concerns of performing an evaluation and management service by telephone and the availability of in person appointments. I also discussed with the patient that there may be a patient responsible charge related to the service.  The patient expressed understanding and agrees to proceed.    Chief Complaint  Patient presents with  . Acute Visit    Tested positive for covid, fever, scratchy throat, congestion in upper chest, little bit of fatigue, hasn't slept well last two nights, not able to wear cpap with congestion, no body aches,   . Telephone Screen    Video call   . Telephone Assessment    [phone]    HPI Patient is being seen for an acute sick visit Tested positive for COVID today on home test Complaining of scratchy throat, congestion and fatigue--he is starting to feel some better Yesterday and last night he was feeling poorly Mild cough--feels congested in his upper chest Remains afebrile and denies body aches Denies shortness of breath or difficulty breathing  This is his second time with COVID, had initially last year at Easter  Current Medication: Outpatient Encounter Medications as of 03/24/2021  Medication Sig  . ACCU-CHEK FASTCLIX LANCETS MISC Use to test blood sugars twice daily e11.65  . aspirin EC 81 MG tablet Take 1 tablet (81 mg total) by mouth daily.  Marland Kitchen azithromycin (ZITHROMAX) 250 MG tablet Take 2 tablets on day 1, then 1 tablet daily on days 2 through 5  . diphenhydrAMINE HCl 50 MG/30ML LIQD Take 30 mLs by mouth at bedtime as needed (sleep).  . ENTRESTO 49-51 MG TAKE 1 TABLET TWICE A DAY (DOSE INCREASE) (Patient  taking differently: Pt gets patient assistance)  . furosemide (LASIX) 40 MG tablet Take 1 tablet (40 mg total) by mouth daily.  Marland Kitchen glimepiride (AMARYL) 2 MG tablet Take 1 tablet (2 mg total) by mouth daily before breakfast.  . metFORMIN (GLUCOPHAGE) 500 MG tablet TAKE 1 TABLET THREE TIMES A DAY WITH MEALS  . metoprolol succinate (TOPROL-XL) 50 MG 24 hr tablet TAKE 1 TABLET DAILY. TAKE WITH OR IMMEDIATELY FOLLOWING A MEAL  . omeprazole (PRILOSEC) 20 MG capsule Take 1 capsule (20 mg total) by mouth daily.  . potassium chloride (KLOR-CON) 10 MEQ tablet Take 1 tablet (10 mEq total) by mouth daily.  . rosuvastatin (CRESTOR) 10 MG tablet Take 1 tablet (10 mg total) by mouth daily.  Marland Kitchen spironolactone (ALDACTONE) 25 MG tablet Take 1 tablet (25 mg total) by mouth daily.  . TRULICITY 1.5 MG/0.5ML SOPN INJECT UNDER THE SKIN ONCE WEEKLY IN THE ABDOMEN, THIGH, OR UPPER ARM, ANY TIME OF DAY, WITH OR WITHOUT FOOD. (Patient taking differently: Pt gets pt's assistance)   No facility-administered encounter medications on file as of 03/24/2021.    Surgical History: Past Surgical History:  Procedure Laterality Date  . LEFT HEART CATH AND CORONARY ANGIOGRAPHY Left 04/12/2018   Procedure: LEFT HEART CATH AND CORONARY ANGIOGRAPHY;  Surgeon: Antonieta Iba, MD;  Location: [facility] INVASIVE CV LAB;  Service: Cardiovascular;  Laterality: Left;    Medical History: Past Medical History:  Diagnosis Date  . Anxiety   . Cataract   . CHF (congestive heart  failure) (HCC)   . COPD (chronic obstructive pulmonary disease) (HCC)   . Diabetes mellitus without complication (HCC)   . Sleep apnea     Family History: Family History  Problem Relation Age of Onset  . Heart disease Mother   . Lung cancer Father   . Heart disease Maternal Grandmother   . Heart Problems Maternal Grandmother   . Heart Problems Brother   . Heart Problems Maternal Grandfather     Social History   Socioeconomic History  . Marital status:  Single    Spouse name: Not on file  . Number of children: Not on file  . Years of education: Not on file  . Highest education level: Not on file  Occupational History  . Not on file  Tobacco Use  . Smoking status: Former Smoker    Packs/day: 1.50    Years: 40.00    Pack years: 60.00    Types: Cigarettes  . Smokeless tobacco: Never Used  . Tobacco comment: quit 2008  Vaping Use  . Vaping Use: Never used  Substance and Sexual Activity  . Alcohol use: Yes    Alcohol/week: 1.0 standard drink    Types: 1 Shots of liquor per week    Comment: occasional drink  . Drug use: Never  . Sexual activity: Not on file  Other Topics Concern  . Not on file  Social History Narrative  . Not on file   Social Determinants of Health   Financial Resource Strain: Not on file  Food Insecurity: Not on file  Transportation Needs: Not on file  Physical Activity: Not on file  Stress: Not on file  Social Connections: Not on file  Intimate Partner Violence: Not on file      Review of Systems  Constitutional: Negative for chills, fatigue and unexpected weight change.  HENT: Positive for congestion, sinus pressure, sinus pain and sore throat. Negative for postnasal drip, rhinorrhea and sneezing.   Eyes: Negative for redness.  Respiratory: Positive for cough. Negative for chest tightness and shortness of breath.   Cardiovascular: Negative for chest pain and palpitations.  Gastrointestinal: Negative for abdominal pain, constipation, diarrhea, nausea and vomiting.  Genitourinary: Negative for dysuria and frequency.  Musculoskeletal: Negative for arthralgias, back pain, joint swelling and neck pain.  Skin: Negative for rash.  Neurological: Negative.  Negative for tremors and numbness.  Hematological: Negative for adenopathy. Does not bruise/bleed easily.  Psychiatric/Behavioral: Negative for behavioral problems (Depression), sleep disturbance and suicidal ideas. The patient is not nervous/anxious.      Vital Signs: BP 120/76   Pulse 96   Temp 99.1 F (37.3 C)   Resp 16   Ht 5\' 8"  (1.727 m)   Wt 212 lb (96.2 kg)   BMI 32.23 kg/m    Observation/Objective: Alert and oriented, answers questions appropriately. No evidence of acute distress.  Assessment/Plan: 1. Acute COVID-19 Complete course of ZPAK, advised to rest and increase fluid intake May also take OTC Mucinex to help with congestion Advised to contact office if symptoms worsen or if he is not feeling better within 10 days - azithromycin (ZITHROMAX) 250 MG tablet; Take 2 tablets on day 1, then 1 tablet daily on days 2 through 5  Dispense: 6 tablet; Refill: 0  2. Essential hypertension BP and HR remain well controlled, continue to monitor  General Counseling: holman bonsignore understanding of the findings of today's phone visit and agrees with plan of treatment. I have discussed any further diagnostic evaluation that may be  needed or ordered today. We also reviewed his medications today. he has been encouraged to call the office with any questions or concerns that should arise related to todays visit.   Meds ordered this encounter  Medications  . azithromycin (ZITHROMAX) 250 MG tablet    Sig: Take 2 tablets on day 1, then 1 tablet daily on days 2 through 5    Dispense:  6 tablet    Refill:  0     Time spent:25 Minutes Time spent includes review of chart, medications, test results and follow-up plan with the patient.  Lubertha Basque Tajah Schreiner AGNP-C Internal medicine

## 2021-03-25 ENCOUNTER — Encounter: Payer: Self-pay | Admitting: Hospice and Palliative Medicine

## 2021-04-27 ENCOUNTER — Ambulatory Visit: Payer: Medicare Other | Admitting: Nurse Practitioner

## 2021-04-28 ENCOUNTER — Other Ambulatory Visit: Payer: Self-pay | Admitting: Cardiovascular Disease

## 2021-05-02 ENCOUNTER — Encounter: Payer: Self-pay | Admitting: Nurse Practitioner

## 2021-05-02 ENCOUNTER — Other Ambulatory Visit: Payer: Self-pay

## 2021-05-02 ENCOUNTER — Ambulatory Visit (INDEPENDENT_AMBULATORY_CARE_PROVIDER_SITE_OTHER): Payer: Medicare Other | Admitting: Nurse Practitioner

## 2021-05-02 VITALS — BP 120/84 | HR 83 | Temp 98.0°F | Resp 16 | Ht 68.0 in | Wt 218.6 lb

## 2021-05-02 DIAGNOSIS — Z6833 Body mass index (BMI) 33.0-33.9, adult: Secondary | ICD-10-CM

## 2021-05-02 DIAGNOSIS — E1165 Type 2 diabetes mellitus with hyperglycemia: Secondary | ICD-10-CM | POA: Diagnosis not present

## 2021-05-02 DIAGNOSIS — R300 Dysuria: Secondary | ICD-10-CM

## 2021-05-02 LAB — POCT GLYCOSYLATED HEMOGLOBIN (HGB A1C): Hemoglobin A1C: 7.3 % — AB (ref 4.0–5.6)

## 2021-05-02 NOTE — Progress Notes (Signed)
[facility] [address] [place], [place] [address]  Internal MEDICINE  Office Visit Note  Patient Name: Joe Berry  [id number]  [id number]  Date of Service: 05/07/2021  Chief Complaint  Patient presents with   Diabetes    Patient recently had covid and still have congestion     HPI Cristiano presents for a follow-up visit to check his A1c, he also recently had COVID infection and reports that he is still congested.  He has a history of anxiety, cataracts status post cataract surgery, congestive heart failure, COPD, diabetes, and sleep apnea.  He denies any history of hypertension and blood pressure today is 120/84. -A1c was 7.3 today which is up from 6.9 3 months ago.  Patient reports he has been slacking on his diet and knows what he has to do to bring the number back down.  He is currently taking Trulicity, metformin, and glimepiride for his diabetes.  Current weight is 218 pounds with a BMI of 33.24 patient reports that he knows what to do to bring his weight down along with his A1c.     Current Medication: Outpatient Encounter Medications as of 05/02/2021  Medication Sig   ACCU-CHEK FASTCLIX LANCETS MISC Use to test blood sugars twice daily e11.65   aspirin EC 81 MG tablet Take 1 tablet (81 mg total) by mouth daily.   diphenhydrAMINE HCl 50 MG/30ML LIQD Take 30 mLs by mouth at bedtime as needed (sleep).   ENTRESTO 49-51 MG TAKE 1 TABLET TWICE A DAY (DOSE INCREASE) (Patient taking differently: Pt gets patient assistance)   furosemide (LASIX) 40 MG tablet Take 1 tablet (40 mg total) by mouth daily.   glimepiride (AMARYL) 2 MG tablet Take 1 tablet (2 mg total) by mouth daily before breakfast.   metFORMIN (GLUCOPHAGE) 500 MG tablet TAKE 1 TABLET THREE TIMES A DAY WITH MEALS   metoprolol succinate (TOPROL-XL) 50 MG 24 hr tablet TAKE 1 TABLET DAILY. TAKE  WITH OR IMMEDIATELY        FOLLOWING A MEAL   omeprazole (PRILOSEC) 20 MG capsule Take 1 capsule (20 mg total) by mouth  daily.   potassium chloride (KLOR-CON) 10 MEQ tablet Take 1 tablet (10 mEq total) by mouth daily.   rosuvastatin (CRESTOR) 10 MG tablet TAKE 1 TABLET DAILY   spironolactone (ALDACTONE) 25 MG tablet Take 1 tablet (25 mg total) by mouth daily.   TRULICITY 1.5 MG/0.5ML SOPN INJECT UNDER THE SKIN ONCE WEEKLY IN THE ABDOMEN, THIGH, OR UPPER ARM, ANY TIME OF DAY, WITH OR WITHOUT FOOD. (Patient taking differently: Pt gets pt's assistance)   No facility-administered encounter medications on file as of 05/02/2021.    Surgical History: Past Surgical History:  Procedure Laterality Date   LEFT HEART CATH AND CORONARY ANGIOGRAPHY Left 04/12/2018   Procedure: LEFT HEART CATH AND CORONARY ANGIOGRAPHY;  Surgeon: Antonieta Iba, MD;  Location: [facility] INVASIVE CV LAB;  Service: Cardiovascular;  Laterality: Left;    Medical History: Past Medical History:  Diagnosis Date   Anxiety    Cataract    CHF (congestive heart failure) (HCC)    COPD (chronic obstructive pulmonary disease) (HCC)    Diabetes mellitus without complication (HCC)    Sleep apnea     Family History: Family History  Problem Relation Age of Onset   Heart disease Mother    Lung cancer Father    Heart disease Maternal Grandmother    Heart Problems Maternal Grandmother    Heart Problems Brother  Heart Problems Maternal Grandfather     Social History   Socioeconomic History   Marital status: Single    Spouse name: Not on file   Number of children: Not on file   Years of education: Not on file   Highest education level: Not on file  Occupational History   Not on file  Tobacco Use   Smoking status: Former    Packs/day: 1.50    Years: 40.00    Pack years: 60.00    Types: Cigarettes   Smokeless tobacco: Never   Tobacco comments:    quit 2008  Vaping Use   Vaping Use: Never used  Substance and Sexual Activity   Alcohol use: Yes    Alcohol/week: 1.0 standard drink    Types: 1 Shots of liquor per week    Comment:  occasional drink   Drug use: Never   Sexual activity: Not on file  Other Topics Concern   Not on file  Social History Narrative   Not on file   Social Determinants of Health   Financial Resource Strain: Not on file  Food Insecurity: Not on file  Transportation Needs: Not on file  Physical Activity: Not on file  Stress: Not on file  Social Connections: Not on file  Intimate Partner Violence: Not on file      Review of Systems  Constitutional:  Negative for chills, fatigue and unexpected weight change.  HENT:  Negative for congestion, rhinorrhea, sneezing and sore throat.   Eyes:  Negative for redness.  Respiratory:  Negative for cough, chest tightness and shortness of breath.   Cardiovascular:  Negative for chest pain and palpitations.  Gastrointestinal:  Negative for abdominal pain, constipation, diarrhea, nausea and vomiting.  Genitourinary:  Negative for dysuria and frequency.  Musculoskeletal:  Negative for arthralgias, back pain, joint swelling and neck pain.  Skin:  Negative for rash.  Neurological: Negative.  Negative for tremors and numbness.  Hematological:  Negative for adenopathy. Does not bruise/bleed easily.  Psychiatric/Behavioral:  Negative for behavioral problems (Depression), sleep disturbance and suicidal ideas. The patient is not nervous/anxious.    Vital Signs: BP 120/84   Pulse 83   Temp 98 F (36.7 C)   Resp 16   Ht 5\' 8"  (1.727 m)   Wt 218 lb 9.6 oz (99.2 kg)   SpO2 93%   BMI 33.24 kg/m    Physical Exam Vitals reviewed.  Constitutional:      General: He is not in acute distress.    Appearance: Normal appearance. He is well-developed. He is obese. He is not ill-appearing or diaphoretic.  HENT:     Head: Normocephalic and atraumatic.  Neck:     Thyroid: No thyromegaly.     Vascular: No JVD.     Trachea: No tracheal deviation.  Cardiovascular:     Rate and Rhythm: Normal rate and regular rhythm.     Pulses: Normal pulses.     Heart  sounds: Normal heart sounds. No murmur heard.   No friction rub. No gallop.  Pulmonary:     Effort: Pulmonary effort is normal. No respiratory distress.     Breath sounds: Normal breath sounds. No wheezing or rales.  Chest:     Chest wall: No tenderness.  Musculoskeletal:     Cervical back: Normal range of motion and neck supple.  Lymphadenopathy:     Cervical: No cervical adenopathy.  Skin:    General: Skin is warm and dry.     Capillary Refill:  Capillary refill takes less than 2 seconds.  Neurological:     Mental Status: He is alert and oriented to person, place, and time.  Psychiatric:        Mood and Affect: Mood normal.        Behavior: Behavior normal.    Assessment/Plan: 1. Uncontrolled type 2 diabetes mellitus with hyperglycemia (HCC) Patient has a history of type 2 diabetes and his A1c was 7.3 today.  His previous A1c in March 2022 was 6.9.  Patient reports that he has not been following his diet and making sure he is eating the appropriate foods.  He is on diabetic medications which we will not change at this time.  Patient desires to work on diet modifications to bring his A1c back down without medication adjustment.  We will recheck A1c in 3 months. - POCT glycosylated hemoglobin (Hb A1C)  2. BMI 33.0-33.9,adult Patient reports he is aware that he has not been cognizant of the foods that he is consuming and has not been making an effort to to eat a diet that will aid in weight loss.  Just as with his A1c level, the patient desires to work on diet modification and weight loss on his own before any other intervention is needed.     General Counseling: marcia lepera understanding of the findings of todays visit and agrees with plan of treatment. I have discussed any further diagnostic evaluation that may be needed or ordered today. We also reviewed his medications today. he has been encouraged to call the office with any questions or concerns that should arise related to  todays visit.    Orders Placed This Encounter  Procedures   POCT glycosylated hemoglobin (Hb A1C)    No orders of the defined types were placed in this encounter.   Return in about 3 months (around 08/02/2021) for F/U, Recheck A1C, See [facility] PCP.   Total time spent:30 Minutes Time spent includes review of chart, medications, test results, and follow up plan with the patient.   [place] Controlled Substance Database was reviewed by me.  This patient was seen by Sallyanne Kuster, FNP-C in collaboration with Dr. Beverely Risen as a part of collaborative care agreement.   Emma-Lee Oddo R. Tedd Sias, MSN, FNP-C Internal medicine

## 2021-05-18 ENCOUNTER — Ambulatory Visit: Payer: Medicare Other

## 2021-05-25 ENCOUNTER — Other Ambulatory Visit: Payer: Self-pay

## 2021-05-25 ENCOUNTER — Ambulatory Visit (INDEPENDENT_AMBULATORY_CARE_PROVIDER_SITE_OTHER): Payer: Medicare Other

## 2021-05-25 DIAGNOSIS — G4733 Obstructive sleep apnea (adult) (pediatric): Secondary | ICD-10-CM | POA: Diagnosis not present

## 2021-05-25 NOTE — Progress Notes (Signed)
95 percentile pressure 13.5   95th percentile leak 3.9   apnea index 2.1 /hr  apnea-hypopnea index  5.8 /hr   total days used  >4 hr 14 days  total days used <4 hr 62 days  Total compliance 16 percent   He has not been wearing cpap very much will work on getting new mask and climate line tubing to see if helps

## 2021-05-27 ENCOUNTER — Other Ambulatory Visit: Payer: Self-pay

## 2021-05-27 MED ORDER — METFORMIN HCL 500 MG PO TABS: ORAL_TABLET | ORAL | 1 refills | Status: DC

## 2021-06-01 ENCOUNTER — Other Ambulatory Visit: Payer: Self-pay

## 2021-06-01 MED ORDER — TRULICITY 1.5 MG/0.5ML ~~LOC~~ SOAJ: 1.5000 mg | SUBCUTANEOUS | 2 refills | Status: DC

## 2021-06-02 ENCOUNTER — Other Ambulatory Visit: Payer: Self-pay

## 2021-06-02 DIAGNOSIS — E1165 Type 2 diabetes mellitus with hyperglycemia: Secondary | ICD-10-CM

## 2021-06-02 MED ORDER — GLIMEPIRIDE 2 MG PO TABS: 2.0000 mg | ORAL_TABLET | Freq: Every day | ORAL | 1 refills | Status: DC

## 2021-06-05 NOTE — Progress Notes (Deleted)
Cardiology Office Note  Date:  06/05/2021   ID:  Joe Berry, DOB August 08, 1956, MRN [id number]  PCP:  Sallyanne Kuster, NP   No chief complaint on file.   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 No regular exercise program Into metal detection, fun hobby  Blood pressure well controlled at home BP 110 systolic at home  In general reports he has been doing well Some mild fatigue at home No chest pain or shortness of breath concerning for angina  Recent echocardiograms discussed with him showing improved ejection fraction last visualized 2019 He denies any leg edema, abdominal distention, weight gain concerning for fluid retention  Chart indicating 10 pound weight gain this year  Lab work reviewed  HBA1C 8.1 up from 7 Total chol 99, LDL 37  History of sleep apnea, tolerating CPAP Tolerating it Lasix 40 daily with potassium  EKG personally reviewed by myself on todays visit Shows normal sinus rhythm rate 80 bpm no significant ST-T wave changes  Other past medical history reviewed right Heart cath (very difficult procedure as unable to measure pressures in the PA and wedge) RA pressures: 25 mm Hg RV pressures: 77/17/mean of 25 LVEDP: 32 mm Hg  CT scan images showing significant coronary calcification all 3 vessels  Echocardiogram edges pulled up with him in the office showing severely depressed LV function moderately dilated LV ejection fraction 25-30% dated 03/19/2018   history of sleep apnea and he is non  compliant with CPAP therapy.     PMH:   has a past medical history of Anxiety, Cataract, CHF (congestive heart failure) (HCC), COPD (chronic obstructive pulmonary disease) (HCC), Diabetes mellitus without complication (HCC), and Sleep apnea.  PSH:    Past Surgical History:  Procedure Laterality Date   LEFT HEART CATH AND CORONARY ANGIOGRAPHY Left 04/12/2018   Procedure: LEFT HEART CATH AND CORONARY ANGIOGRAPHY;  Surgeon: Antonieta Iba, MD;  Location: [facility] INVASIVE CV LAB;  Service: Cardiovascular;  Laterality: Left;    Current Outpatient Medications  Medication Sig Dispense Refill   ACCU-CHEK FASTCLIX LANCETS MISC Use to test blood sugars twice daily e11.65 102 each 11   aspirin EC 81 MG tablet Take 1 tablet (81 mg total) by mouth daily. 90 tablet 3   diphenhydrAMINE HCl 50 MG/30ML LIQD Take 30 mLs by mouth at bedtime as needed (sleep).     Dulaglutide (TRULICITY) 1.5 MG/0.5ML SOPN Inject 1.5 mg as directed once a week. INJECT UNDER THE SKIN ONCE WEEKLY IN THE ABDOMEN, THIGH, OR UPPER ARM, ANY TIME OF DAY, WITH OR WITHOUT FOOD. 24 mL 2   ENTRESTO 49-51 MG TAKE 1 TABLET TWICE A DAY (DOSE INCREASE) (Patient taking differently: Pt gets patient assistance) 180 tablet 3   furosemide (LASIX) 40 MG tablet Take 1 tablet (40 mg total) by mouth daily. 90 tablet 2   glimepiride (AMARYL) 2 MG tablet Take 1 tablet (2 mg total) by mouth daily before breakfast. 90 tablet 1   metFORMIN (GLUCOPHAGE) 500 MG tablet TAKE 1 TABLET THREE TIMES A DAY WITH MEALS 270 tablet 1   metoprolol succinate (TOPROL-XL) 50 MG 24  hr tablet TAKE 1 TABLET DAILY. TAKE  WITH OR IMMEDIATELY        FOLLOWING A MEAL 90 tablet 0   omeprazole (PRILOSEC) 20 MG capsule Take 1 capsule (20 mg total) by mouth daily. 90 capsule 1   potassium chloride (KLOR-CON) 10 MEQ tablet Take 1 tablet (10 mEq total) by mouth daily. 90 tablet 1   rosuvastatin (CRESTOR) 10 MG tablet TAKE 1 TABLET DAILY 90 tablet 0   spironolactone (ALDACTONE) 25 MG  tablet Take 1 tablet (25 mg total) by mouth daily. 90 tablet 3   No current facility-administered medications for this visit.    Allergies:   Patient has no known allergies.   Social History:  The patient  reports that he has quit smoking. His smoking use included cigarettes. He has a 60.00 pack-year smoking history. He has never used smokeless tobacco. He reports current alcohol use of about 1.0 standard drink of alcohol per week. He reports that he does not use drugs.   Family History:   family history includes Heart Problems in his brother, maternal grandfather, and maternal grandmother; Heart disease in his maternal grandmother and mother; Lung cancer in his father.    Review of Systems  Constitutional: Negative.   HENT: Negative.    Respiratory: Negative.    Cardiovascular: Negative.   Gastrointestinal: Negative.   Musculoskeletal: Negative.   Neurological: Negative.   Psychiatric/Behavioral: Negative.    All other systems reviewed and are negative.  PHYSICAL EXAM: VS:  There were no vitals taken for this visit. , BMI There is no height or weight on file to calculate BMI. Constitutional:  oriented to person, place, and time. No distress.  HENT:  Head: Grossly normal Eyes:  no discharge. No scleral icterus.  Neck: No JVD, no carotid bruits  Cardiovascular: Regular rate and rhythm, no murmurs appreciated Pulmonary/Chest: Clear to auscultation bilaterally, no wheezes or rails Abdominal: Soft.  no distension.  no tenderness.  Musculoskeletal: Normal range of motion Neurological:  normal muscle tone. Coordination normal. No atrophy Skin: Skin warm and dry Psychiatric: normal affect, pleasant   Recent Labs: 10/19/2020: ALT 23; BUN 20; Creatinine, Ser 0.89; Hemoglobin 14.6; Platelets 276; Potassium 5.0; Sodium 137; TSH 1.970    Lipid Panel Lab Results  Component Value Date   CHOL 88 (L) 10/19/2020   HDL 35 (L) 10/19/2020   LDLCALC 36 10/19/2020   TRIG 85 10/19/2020       Wt Readings from Last 3 Encounters:  05/02/21 218 lb 9.6 oz (99.2 kg)  03/24/21 212 lb (96.2 kg)  02/21/21 218 lb (98.9 kg)     ASSESSMENT AND PLAN:  Dilated cardiomyopathy (HCC) -  Continue Entresto, metoprolol, Lasix daily with spironolactone Blood pressure low but stable, no near syncope/orthostasis symptoms Weight gain likely from dietary issues, less exercise   Hyperlipidemia Cholesterol at goal, no medication changes made Numbers discussed  Empyema lung (HCC) Prior CT scan showing COPD Breathing stable, no changes  SOB (shortness of breath) Recommended weight loss program, exercise  Coronary artery calcification with stable angina CT scan with diffuse heavy calcification LAD, circumflex and RCA Coronary artery disease seen on cardiac catheterization Stressed importance of aggressive cholesterol management  Cardiomyopathy Nonischemic Ejection fraction initially 20% up to 35% on repeat study 2019 Continue Entresto spironolactone metoprolol Lasix   Total encounter time more than 25 minutes  Greater than 50% was spent in counseling and coordination of care with the patient    No orders of the defined types were  placed in this encounter.    Signed, Dossie Arbour, M.D., Ph.D. 06/05/2021  [facility] [place], [place] [phone]

## 2021-06-06 ENCOUNTER — Ambulatory Visit: Payer: Medicare Other | Admitting: Cardiovascular Disease

## 2021-06-14 ENCOUNTER — Encounter: Payer: Self-pay | Admitting: Nurse Practitioner

## 2021-06-14 ENCOUNTER — Other Ambulatory Visit: Payer: Self-pay

## 2021-06-14 ENCOUNTER — Ambulatory Visit (INDEPENDENT_AMBULATORY_CARE_PROVIDER_SITE_OTHER): Payer: Medicare Other | Admitting: Nurse Practitioner

## 2021-06-14 VITALS — BP 128/80 | HR 81 | Ht 68.0 in | Wt 224.0 lb

## 2021-06-14 DIAGNOSIS — I2510 Atherosclerotic heart disease of native coronary artery without angina pectoris: Secondary | ICD-10-CM | POA: Diagnosis not present

## 2021-06-14 DIAGNOSIS — E119 Type 2 diabetes mellitus without complications: Secondary | ICD-10-CM

## 2021-06-14 DIAGNOSIS — I5022 Chronic systolic (congestive) heart failure: Secondary | ICD-10-CM | POA: Diagnosis not present

## 2021-06-14 DIAGNOSIS — G4733 Obstructive sleep apnea (adult) (pediatric): Secondary | ICD-10-CM | POA: Diagnosis not present

## 2021-06-14 DIAGNOSIS — I428 Other cardiomyopathies: Secondary | ICD-10-CM | POA: Diagnosis not present

## 2021-06-14 DIAGNOSIS — Z794 Long term (current) use of insulin: Secondary | ICD-10-CM

## 2021-06-14 NOTE — Progress Notes (Signed)
Office Visit    Patient Name: Joe Berry Date of Encounter: 06/14/2021  Primary Care Provider:  Sallyanne Kuster, NP Primary Cardiologist:  Julien Nordmann, MD  Chief Complaint    65 year old male with a history of nonischemic cardiomyopathy, HFrEF, nonobstructive CAD, diabetes, sleep apnea, remote tobacco abuse, and COPD, who presents for follow-up of HFrEF.  Past Medical History    Past Medical History:  Diagnosis Date   Anxiety    Cataract    Chronic HFrEF (heart failure with reduced ejection fraction) (HCC)    a. 28-Mar-2018 Echo: EF 25-30%; b. 07/2018 Echo: EF 35-40%; c. 11/2020 Echo: EF 45-50%, diast dysfxn, nl RV fxn. Trace MR/TR.   COPD (chronic obstructive pulmonary disease) (HCC)    Diabetes mellitus without complication (HCC)    NICM (nonischemic cardiomyopathy) (HCC)    a. 28-Mar-2018 Echo: EF 25-30%; b. 07/2018 Echo: EF 35-40%; c. 11/2020 Echo: EF 45-50%, diast dysfxn.   Nonobstructive CAD (coronary artery disease)    a. 03/2018 Cath: LM nl, LAD mild diff dzs prox, heavy Ca2+, 60d, LCX nl/large, OM1 50, RCA nl. EF <20%.   Sleep apnea    Past Surgical History:  Procedure Laterality Date   LEFT HEART CATH AND CORONARY ANGIOGRAPHY Left 04/12/2018   Procedure: LEFT HEART CATH AND CORONARY ANGIOGRAPHY;  Surgeon: Antonieta Iba, MD;  Location: [facility] INVASIVE CV LAB;  Service: Cardiovascular;  Laterality: Left;    Allergies  No Known Allergies  History of Present Illness    65 year old male with the above past medical history including nonischemic cardiomyopathy, HFrEF, nonobstructive CAD, diabetes, sleep apnea, remote tobacco abuse, and COPD.  He was previous evaluated in early 2018/03/28 in the setting of dyspnea with echocardiogram showing an EF of 25 to 30%.  Subsequent diagnostic catheterization in May 2019 showed nonobstructive coronary artery disease with recommendation for medical therapy.  Most recent echocardiogram in January 2022 showed improvement in LV function to 45-50%  with diastolic dysfunction.  Patient was last seen in cardiology clinic in July 2021, at which time he was exercising regularly without symptoms or limitations and tolerating medical management well.  Unfortunately, his dog died in 03/29/23, and his activity has fallen off since then.  He used to walk his dog for about 30 minutes a day but has since been a little more sedentary.  He does not experience any symptoms or limitations with activity.  He denies chest pain, dyspnea, palpitations, PND, orthopnea, dizziness, syncope, edema, or early satiety.  He did have to get on the [employer] assistance program for his Sherryll Burger, since going on Medicare, as he cannot afford it otherwise once he falls into the donut hole.  He is struggled a little bit with his CPAP mask and has follow-up with sleep medicine to try different settings.  Home Medications    Current Outpatient Medications  Medication Sig Dispense Refill   ACCU-CHEK FASTCLIX LANCETS MISC Use to test blood sugars twice daily e11.65 102 each 11   aspirin EC 81 MG tablet Take 1 tablet (81 mg total) by mouth daily. 90 tablet 3   diphenhydrAMINE HCl 50 MG/30ML LIQD Take 30 mLs by mouth at bedtime as needed (sleep).     Dulaglutide (TRULICITY) 1.5 MG/0.5ML SOPN Inject 1.5 mg as directed once a week. INJECT UNDER THE SKIN ONCE WEEKLY IN THE ABDOMEN, THIGH, OR UPPER ARM, ANY TIME OF DAY, WITH OR WITHOUT FOOD. 24 mL 2   ENTRESTO 49-51 MG TAKE 1 TABLET TWICE A DAY (DOSE INCREASE) (Patient  taking differently: Pt gets patient assistance) 180 tablet 3   furosemide (LASIX) 40 MG tablet Take 1 tablet (40 mg total) by mouth daily. 90 tablet 2   glimepiride (AMARYL) 2 MG tablet Take 1 tablet (2 mg total) by mouth daily before breakfast. 90 tablet 1   metFORMIN (GLUCOPHAGE) 500 MG tablet TAKE 1 TABLET THREE TIMES A DAY WITH MEALS 270 tablet 1   metoprolol succinate (TOPROL-XL) 50 MG 24 hr tablet TAKE 1 TABLET DAILY. TAKE  WITH OR IMMEDIATELY        FOLLOWING A MEAL 90  tablet 0   omeprazole (PRILOSEC) 20 MG capsule Take 1 capsule (20 mg total) by mouth daily. 90 capsule 1   potassium chloride (KLOR-CON) 10 MEQ tablet Take 1 tablet (10 mEq total) by mouth daily. 90 tablet 1   rosuvastatin (CRESTOR) 10 MG tablet TAKE 1 TABLET DAILY 90 tablet 0   spironolactone (ALDACTONE) 25 MG tablet Take 1 tablet (25 mg total) by mouth daily. 90 tablet 3   No current facility-administered medications for this visit.     Review of Systems    Overall doing well though exercising less.  He denies chest pain, palpitations, dyspnea, pnd, orthopnea, n, v, dizziness, syncope, edema, weight gain, or early satiety.  All other systems reviewed and are otherwise negative except as noted above.  Physical Exam    VS:  BP 128/80 (BP Location: Left Arm, Patient Position: Sitting, Cuff Size: Normal)   Pulse 81   Ht 5\' 8"  (1.727 m)   Wt 224 lb (101.6 kg)   SpO2 94%   BMI 34.06 kg/m  , BMI Body mass index is 34.06 kg/m.     GEN: Obese, in no acute distress. HEENT: normal. Neck: Supple, no JVD, carotid bruits, or masses. Cardiac: RRR, 2/6 syst murmur @ upper sternal borders, no rubs, or gallops. No clubbing, cyanosis, edema.  Radials/PT 2+ and equal bilaterally.  Respiratory:  Respirations regular and unlabored, clear to auscultation bilaterally. GI: Soft, nontender, nondistended, BS + x 4. MS: no deformity or atrophy. Skin: warm and dry, no rash. Neuro:  Strength and sensation are intact. Psych: Normal affect.  Accessory Clinical Findings    ECG personally reviewed by me today -regular sinus rhythm, 81- no acute changes.  Lab Results  Component Value Date   WBC 8.0 10/19/2020   HGB 14.6 10/19/2020   HCT 43.2 10/19/2020   MCV 86 10/19/2020   PLT 276 10/19/2020   Lab Results  Component Value Date   CREATININE 0.89 10/19/2020   BUN 20 10/19/2020   NA 137 10/19/2020   K 5.0 10/19/2020   CL 96 10/19/2020   CO2 26 10/19/2020   Lab Results  Component Value Date    ALT 23 10/19/2020   AST 17 10/19/2020   ALKPHOS 33 (L) 10/19/2020   BILITOT 0.5 10/19/2020   Lab Results  Component Value Date   CHOL 88 (L) 10/19/2020   HDL 35 (L) 10/19/2020   LDLCALC 36 10/19/2020   TRIG 85 10/19/2020   CHOLHDL 5.5 06/13/2018    Lab Results  Component Value Date   HGBA1C 7.3 (A) 05/02/2021    Assessment & Plan    1.  Nonischemic cardiomyopathy/heart failure with midrange ejection fraction: EF previously as low as 25 to 30% in April 2019 with catheterization at that time showing moderate nonobstructive disease.  With medical therapy, EF has improved over the years and he had an echo in January showing an EF of 45 to  50% with diastolic dysfunction.  He is well compensated and denies chest pain, dyspnea, or edema.  He is euvolemic on examination today.  He remains on beta-blocker, Entresto, and spironolactone.  He has been having trouble affording medication, specifically Entresto, when he falls in the donut hole, thus SGLT2 inhibitor therapy is not likely to be feasible.  I will follow-up a basic metabolic panel today given a potassium of 5.0 in November with ongoing spironolactone and Entresto therapy.  2.  Nonobstructive CAD: Cath in May 2019 without significant disease.  He has not been having any chest pain.  He remains on aspirin, beta-blocker, and statin therapy.  LDL was 36 last November.  3.  Type 2 diabetes mellitus: A1c 7.3 in June.  Followed by primary care.  Could consider SGLT2 inhibitor though based on his experience with Entresto, I suspect this is cost prohibitive.  4.  Morbid obesity: Patient has been less active over the past several months and his weight is up about 12 pounds since May.  Encouraged regular activity.  5.  Obstructive sleep apnea: He has follow-up sleep medicine in the setting of recent intolerance of his mask.  6.  Disposition: Follow-up basic metabolic panel today.  Follow-up in 6 months or sooner if necessary.  Nicolasa Ducking,  NP 06/14/2021, 9:07 AM

## 2021-06-14 NOTE — Patient Instructions (Signed)
Medication Instructions:  Your physician recommends that you continue on your current medications as directed. Please refer to the Current Medication list given to you today.  *If you need a refill on your cardiac medications before your next appointment, please call your pharmacy*   Lab Work:  BMP to be drawn today.  If you have labs (blood work) drawn today and your tests are completely normal, you will receive your results only by: MyChart Message (if you have MyChart) OR A paper copy in the mail If you have any lab test that is abnormal or we need to change your treatment, we will call you to review the results.   Testing/Procedures: None ordered   Follow-Up: At [facility], you and your health needs are our priority.  As part of our continuing mission to provide you with exceptional heart care, we have created designated Provider Care Teams.  These Care Teams include your primary Cardiologist (physician) and Advanced Practice Providers (APPs -  Physician Assistants and Nurse Practitioners) who all work together to provide you with the care you need, when you need it.  We recommend signing up for the patient portal called "MyChart".  Sign up information is provided on this After Visit Summary.  MyChart is used to connect with patients for Virtual Visits (Telemedicine).  Patients are able to view lab/test results, encounter notes, upcoming appointments, etc.  Non-urgent messages can be sent to your provider as well.   To learn more about what you can do with MyChart, go to [email or web address].    Your next appointment:   6 month(s)  The format for your next appointment:   In Person  Provider:   You may see Dr. Mariah Milling or one of the following Advanced Practice Providers on your designated Care Team:   Nicolasa Ducking, NP Eula Listen, PA-C Marisue Ivan, PA-C Cadence Fransico Michael, [place]   Other Instructions

## 2021-06-15 LAB — BASIC METABOLIC PANEL
BUN/Creatinine Ratio: 17 (ref 10–24)
BUN: 16 mg/dL (ref 8–27)
CO2: 24 mmol/L (ref 20–29)
Calcium: 9.5 mg/dL (ref 8.6–10.2)
Chloride: 97 mmol/L (ref 96–106)
Creatinine, Ser: 0.94 mg/dL (ref 0.76–1.27)
Glucose: 205 mg/dL — ABNORMAL HIGH (ref 65–99)
Potassium: 4.9 mmol/L (ref 3.5–5.2)
Sodium: 136 mmol/L (ref 134–144)
eGFR: 90 mL/min/{1.73_m2} (ref >59)

## 2021-06-28 ENCOUNTER — Other Ambulatory Visit: Payer: Self-pay

## 2021-06-28 DIAGNOSIS — K219 Gastro-esophageal reflux disease without esophagitis: Secondary | ICD-10-CM

## 2021-06-28 MED ORDER — OMEPRAZOLE 20 MG PO CPDR: 20.0000 mg | DELAYED_RELEASE_CAPSULE | Freq: Every day | ORAL | 1 refills | Status: DC

## 2021-07-15 ENCOUNTER — Other Ambulatory Visit: Payer: Self-pay | Admitting: Cardiovascular Disease

## 2021-08-01 ENCOUNTER — Encounter: Payer: Self-pay | Admitting: Nurse Practitioner

## 2021-08-01 ENCOUNTER — Ambulatory Visit (INDEPENDENT_AMBULATORY_CARE_PROVIDER_SITE_OTHER): Payer: Medicare Other | Admitting: Nurse Practitioner

## 2021-08-01 ENCOUNTER — Other Ambulatory Visit: Payer: Self-pay

## 2021-08-01 VITALS — BP 124/82 | HR 77 | Temp 98.3°F | Resp 16 | Ht 68.0 in | Wt 227.6 lb

## 2021-08-01 DIAGNOSIS — I10 Essential (primary) hypertension: Secondary | ICD-10-CM

## 2021-08-01 DIAGNOSIS — I5022 Chronic systolic (congestive) heart failure: Secondary | ICD-10-CM

## 2021-08-01 DIAGNOSIS — Z23 Encounter for immunization: Secondary | ICD-10-CM | POA: Diagnosis not present

## 2021-08-01 DIAGNOSIS — E1165 Type 2 diabetes mellitus with hyperglycemia: Secondary | ICD-10-CM

## 2021-08-01 LAB — POCT GLYCOSYLATED HEMOGLOBIN (HGB A1C): Hemoglobin A1C: 7.6 % — AB (ref 4.0–5.6)

## 2021-08-01 NOTE — Progress Notes (Signed)
[facility] [address] [place], [place] [address]  Internal MEDICINE  Office Visit Note  Patient Name: Joe Berry  [id number]  [id number]  Date of Service: 08/01/2021  Chief Complaint  Patient presents with   Follow-up    Used to walk but doesn't now, pt noticed that he gets winded/SOB easier    Diabetes   Sleep Apnea   Anxiety   COPD   Quality Metric Gaps    Shingrix and pna done pt will send dates    HPI Joe Berry presents for a follow up visit for diabetes. His A1C is 7.6 today which is up from 7.3 in June 2022. He reports that he has not been walking daily since his dog passed away in 14-Mar-2023 this year. He declined recommendations for medication adjustment. He reports he wants to work on improving his glucose levels with diet and lifestyle modifications. He gets financial assistance for trulicity. He was seen by Dr. Mariah Milling in July.  He is requesting his flu shot today.  --[name] also needs CPAP supplies and will be seeing Tresa Endo later this month.    Current Medication: Outpatient Encounter Medications as of 08/01/2021  Medication Sig   ACCU-CHEK FASTCLIX LANCETS MISC Use to test blood sugars twice daily e11.65   aspirin EC 81 MG tablet Take 1 tablet (81 mg total) by mouth daily.   diphenhydrAMINE HCl 50 MG/30ML LIQD Take 30 mLs by mouth at bedtime as needed (sleep).   Dulaglutide (TRULICITY) 1.5 MG/0.5ML SOPN Inject 1.5 mg as directed once a week. INJECT UNDER THE SKIN ONCE WEEKLY IN THE ABDOMEN, THIGH, OR UPPER ARM, ANY TIME OF DAY, WITH OR WITHOUT FOOD.   ENTRESTO 49-51 MG TAKE 1 TABLET TWICE A DAY (DOSE INCREASE) (Patient taking differently: Pt gets patient assistance)   furosemide (LASIX) 40 MG tablet Take 1 tablet (40 mg total) by mouth daily.   glimepiride (AMARYL) 2 MG tablet Take 1 tablet (2 mg total) by mouth daily before breakfast.   metFORMIN (GLUCOPHAGE) 500 MG tablet TAKE 1 TABLET THREE TIMES A DAY WITH MEALS   metoprolol succinate (TOPROL-XL) 50 MG 24 hr  tablet TAKE 1 TABLET DAILY. TAKE  WITH OR IMMEDIATELY        FOLLOWING A MEAL   omeprazole (PRILOSEC) 20 MG capsule Take 1 capsule (20 mg total) by mouth daily.   potassium chloride (KLOR-CON) 10 MEQ tablet Take 1 tablet (10 mEq total) by mouth daily.   rosuvastatin (CRESTOR) 10 MG tablet TAKE 1 TABLET DAILY   spironolactone (ALDACTONE) 25 MG tablet Take 1 tablet (25 mg total) by mouth daily.   No facility-administered encounter medications on file as of 08/01/2021.    Surgical History: Past Surgical History:  Procedure Laterality Date   LEFT HEART CATH AND CORONARY ANGIOGRAPHY Left 04/12/2018   Procedure: LEFT HEART CATH AND CORONARY ANGIOGRAPHY;  Surgeon: Antonieta Iba, MD;  Location: [facility] INVASIVE CV LAB;  Service: Cardiovascular;  Laterality: Left;    Medical History: Past Medical History:  Diagnosis Date   Anxiety    Cataract    Chronic HFrEF (heart failure with reduced ejection fraction) (HCC)    a. 13-Mar-2018 Echo: EF 25-30%; b. 07/2018 Echo: EF 35-40%; c. 11/2020 Echo: EF 45-50%, diast dysfxn, nl RV fxn. Trace MR/TR.   COPD (chronic obstructive pulmonary disease) (HCC)    Diabetes mellitus without complication (HCC)    NICM (nonischemic cardiomyopathy) (HCC)    a. 03/13/2018 Echo: EF 25-30%; b. 07/2018 Echo: EF 35-40%; c. 11/2020 Echo:  EF 45-50%, diast dysfxn.   Nonobstructive CAD (coronary artery disease)    a. 03/2018 Cath: LM nl, LAD mild diff dzs prox, heavy Ca2+, 60d, LCX nl/large, OM1 50, RCA nl. EF <20%.   Sleep apnea     Family History: Family History  Problem Relation Age of Onset   Heart disease Mother    Lung cancer Father    Heart disease Maternal Grandmother    Heart Problems Maternal Grandmother    Heart Problems Brother    Heart Problems Maternal Grandfather     Social History   Socioeconomic History   Marital status: Single    Spouse name: Not on file   Number of children: Not on file   Years of education: Not on file   Highest education level: Not on  file  Occupational History   Not on file  Tobacco Use   Smoking status: Former    Packs/day: 1.50    Years: 40.00    Pack years: 60.00    Types: Cigarettes   Smokeless tobacco: Never   Tobacco comments:    quit 2008  Vaping Use   Vaping Use: Never used  Substance and Sexual Activity   Alcohol use: Yes    Alcohol/week: 1.0 standard drink    Types: 1 Shots of liquor per week    Comment: occasional drink   Drug use: Never   Sexual activity: Not on file  Other Topics Concern   Not on file  Social History Narrative   Not on file   Social Determinants of Health   Financial Resource Strain: Not on file  Food Insecurity: Not on file  Transportation Needs: Not on file  Physical Activity: Not on file  Stress: Not on file  Social Connections: Not on file  Intimate Partner Violence: Not on file      Review of Systems  Constitutional:  Negative for chills, fatigue and unexpected weight change.  HENT:  Negative for congestion, rhinorrhea, sneezing and sore throat.   Eyes:  Negative for redness.  Respiratory:  Negative for cough, chest tightness and shortness of breath.   Cardiovascular:  Negative for chest pain and palpitations.  Gastrointestinal:  Negative for abdominal pain, constipation, diarrhea, nausea and vomiting.  Genitourinary:  Negative for dysuria and frequency.  Musculoskeletal:  Negative for arthralgias, back pain, joint swelling and neck pain.  Skin:  Negative for rash.  Neurological: Negative.  Negative for tremors and numbness.  Hematological:  Negative for adenopathy. Does not bruise/bleed easily.  Psychiatric/Behavioral:  Negative for behavioral problems (Depression), sleep disturbance and suicidal ideas. The patient is not nervous/anxious.    Vital Signs: BP 124/82   Pulse 77   Temp 98.3 F (36.8 C)   Resp 16   Ht 5\' 8"  (1.727 m)   Wt 227 lb 9.6 oz (103.2 kg)   SpO2 98%   BMI 34.61 kg/m    Physical Exam Vitals reviewed.  Constitutional:       General: He is not in acute distress.    Appearance: Normal appearance. He is obese. He is not ill-appearing.  HENT:     Head: Normocephalic and atraumatic.  Eyes:     Extraocular Movements: Extraocular movements intact.     Pupils: Pupils are equal, round, and reactive to light.  Cardiovascular:     Rate and Rhythm: Normal rate and regular rhythm.  Pulmonary:     Effort: Pulmonary effort is normal. No respiratory distress.  Neurological:     Mental Status: He  is alert and oriented to person, place, and time.     Cranial Nerves: No cranial nerve deficit.     Coordination: Coordination normal.     Gait: Gait normal.  Psychiatric:        Mood and Affect: Mood normal.        Behavior: Behavior normal.     Assessment/Plan: 1. Uncontrolled type 2 diabetes mellitus with hyperglycemia (HCC) A1C has increased, patient does not want to change any of his medications and prefers to work on diet and lifestyle modifications. Provider recommended increased trulicity dose, but he declined until his next A1C check. Recheck A1C in 3 months, if no change or worsening A1C will increase trulicity dose.  - POCT HgB A1C  2. Essential hypertension Stable, continue current medications as prescribed  3. Chronic HFrEF (heart failure with reduced ejection fraction) (HCC) Followed by Dr. Mariah Milling, takes Sherryll Burger, receives financial assistance for this medication  4. Needs flu shot Administered in office today.  - Flu Vaccine MDCK QUAD PF   General Counseling: Jasdeep verbalizes understanding of the findings of todays visit and agrees with plan of treatment. I have discussed any further diagnostic evaluation that may be needed or ordered today. We also reviewed his medications today. he has been encouraged to call the office with any questions or concerns that should arise related to todays visit.    Orders Placed This Encounter  Procedures   Flu Vaccine MDCK QUAD PF   POCT HgB A1C    No orders of the  defined types were placed in this encounter.   Return in about 3 months (around 10/31/2021) for F/U, Recheck A1C, Sena Hoopingarner PCP.   Total time spent:30 Minutes Time spent includes review of chart, medications, test results, and follow up plan with the patient.   [place] Controlled Substance Database was reviewed by me.  This patient was seen by Sallyanne Kuster, FNP-C in collaboration with Dr. Beverely Risen as a part of collaborative care agreement.   [place] R. Tedd Sias, MSN, FNP-C Internal medicine

## 2021-08-10 ENCOUNTER — Telehealth: Payer: Self-pay

## 2021-08-10 ENCOUNTER — Other Ambulatory Visit: Payer: Self-pay

## 2021-08-10 ENCOUNTER — Encounter: Payer: Self-pay | Admitting: Nurse Practitioner

## 2021-08-10 DIAGNOSIS — G4733 Obstructive sleep apnea (adult) (pediatric): Secondary | ICD-10-CM

## 2021-08-10 NOTE — Telephone Encounter (Signed)
Put order in the epic for cpap supplies and give to [facility]

## 2021-09-09 ENCOUNTER — Telehealth: Payer: Self-pay

## 2021-09-09 NOTE — Telephone Encounter (Signed)
Verbal order for use of medical home equipment signed by provider and placed in AHP folder. 

## 2021-09-23 ENCOUNTER — Telehealth: Payer: Self-pay

## 2021-09-23 NOTE — Telephone Encounter (Signed)
Mailed application for pt assistance for TRULICITY to pt. For re-enrollment 

## 2021-09-24 ENCOUNTER — Other Ambulatory Visit: Payer: Self-pay | Admitting: Cardiovascular Disease

## 2021-10-07 ENCOUNTER — Telehealth: Payer: Self-pay | Admitting: Cardiovascular Disease

## 2021-10-07 MED ORDER — ENTRESTO 49-51 MG PO TABS: 1.0000 | ORAL_TABLET | Freq: Two times a day (BID) | ORAL | 3 refills | Status: DC

## 2021-10-07 NOTE — Telephone Encounter (Signed)
Received PA application Completed provider's portion, attached copy of insurance card and med list Dr. Gollan has signed Form faxed and placed in file cabinet  

## 2021-10-07 NOTE — Telephone Encounter (Signed)
Patient dropped off PAF put in box °

## 2021-10-26 ENCOUNTER — Telehealth: Payer: Self-pay

## 2021-10-26 NOTE — Telephone Encounter (Signed)
Verbal order for OSA supplies signed by DFK and placed in AHP folder. 

## 2021-10-31 ENCOUNTER — Other Ambulatory Visit: Payer: Self-pay

## 2021-10-31 ENCOUNTER — Ambulatory Visit (INDEPENDENT_AMBULATORY_CARE_PROVIDER_SITE_OTHER): Payer: Medicare Other | Admitting: Nurse Practitioner

## 2021-10-31 ENCOUNTER — Encounter: Payer: Self-pay | Admitting: Nurse Practitioner

## 2021-10-31 VITALS — BP 121/69 | HR 76 | Temp 98.7°F | Resp 16 | Ht 68.0 in | Wt 232.0 lb

## 2021-10-31 DIAGNOSIS — E1165 Type 2 diabetes mellitus with hyperglycemia: Secondary | ICD-10-CM

## 2021-10-31 DIAGNOSIS — R300 Dysuria: Secondary | ICD-10-CM

## 2021-10-31 DIAGNOSIS — Z0001 Encounter for general adult medical examination with abnormal findings: Secondary | ICD-10-CM

## 2021-10-31 DIAGNOSIS — G4733 Obstructive sleep apnea (adult) (pediatric): Secondary | ICD-10-CM | POA: Diagnosis not present

## 2021-10-31 DIAGNOSIS — J449 Chronic obstructive pulmonary disease, unspecified: Secondary | ICD-10-CM | POA: Diagnosis not present

## 2021-10-31 DIAGNOSIS — Z6835 Body mass index (BMI) 35.0-35.9, adult: Secondary | ICD-10-CM

## 2021-10-31 DIAGNOSIS — E782 Mixed hyperlipidemia: Secondary | ICD-10-CM

## 2021-10-31 DIAGNOSIS — I10 Essential (primary) hypertension: Secondary | ICD-10-CM

## 2021-10-31 DIAGNOSIS — E559 Vitamin D deficiency, unspecified: Secondary | ICD-10-CM

## 2021-10-31 DIAGNOSIS — I5022 Chronic systolic (congestive) heart failure: Secondary | ICD-10-CM

## 2021-10-31 LAB — POCT GLYCOSYLATED HEMOGLOBIN (HGB A1C): Hemoglobin A1C: 8.4 % — AB (ref 4.0–5.6)

## 2021-10-31 MED ORDER — METFORMIN HCL 500 MG PO TABS: ORAL_TABLET | ORAL | 1 refills | Status: DC

## 2021-10-31 MED ORDER — DULAGLUTIDE 3 MG/0.5ML ~~LOC~~ SOAJ: 3.0000 mg | SUBCUTANEOUS | 2 refills | Status: DC

## 2021-10-31 NOTE — Progress Notes (Signed)
[facility] [place], [place] [address]  Internal MEDICINE  Office Visit Note  Patient Name: Joe Berry  [id number]  [id number]  Date of Service: 10/31/2021  Chief Complaint  Patient presents with   Medicare Wellness   Anxiety   Diabetes    HPI Joe Berry presents for an annual well visit and physical exam.  He is a well-appearing 65 year old male.  His A1c is due to be checked today. His A1C is 8.4 today. This is a significant increase from 7.6 in September. He declined increasing his trulicity dose previously. He did the cologard stool test in 2020. He received his shingles vaccine a couple of years ago as well as his pneumonia vaccine.  Joe Berry has hyperlipidemia and is taking rosuvastatin for this.  Along with Trulicity, he is also taking metformin and glimepiride for diabetes.  He has congestive heart failure with reduced ejection fraction and is followed by cardiology.  He takes Entresto, metoprolol, spironolactone and Lasix.  He does receive financial assistance for [place].     Current Medication: Outpatient Encounter Medications as of 10/31/2021  Medication Sig   ACCU-CHEK FASTCLIX LANCETS MISC Use to test blood sugars twice daily e11.65   aspirin EC 81 MG tablet Take 1 tablet (81 mg total) by mouth daily.   diphenhydrAMINE HCl 50 MG/30ML LIQD Take 30 mLs by mouth at bedtime as needed (sleep).   Dulaglutide 3 MG/0.5ML SOPN Inject 3 mg into the skin once a week.   furosemide (LASIX) 40 MG tablet Take 1 tablet (40 mg total) by mouth daily.   glimepiride (AMARYL) 2 MG tablet Take 1 tablet (2 mg total) by mouth daily before breakfast.   metoprolol succinate (TOPROL-XL) 50 MG 24 hr tablet TAKE 1 TABLET DAILY. TAKE  WITH OR IMMEDIATELY        FOLLOWING A MEAL   omeprazole (PRILOSEC) 20 MG capsule Take 1 capsule (20 mg total) by mouth daily.   potassium chloride (KLOR-CON) 10 MEQ tablet TAKE 1 TABLET DAILY   rosuvastatin (CRESTOR) 10 MG tablet  TAKE 1 TABLET DAILY   sacubitril-valsartan (ENTRESTO) 49-51 MG Take 1 tablet by mouth 2 (two) times daily.   spironolactone (ALDACTONE) 25 MG tablet Take 1 tablet (25 mg total) by mouth daily.   [DISCONTINUED] Dulaglutide (TRULICITY) 1.5 YD/7.4JO SOPN Inject 1.5 mg as directed once a week. INJECT UNDER THE SKIN ONCE WEEKLY IN THE ABDOMEN, THIGH, OR UPPER ARM, ANY TIME OF DAY, WITH OR WITHOUT FOOD.   [DISCONTINUED] metFORMIN (GLUCOPHAGE) 500 MG tablet TAKE 1 TABLET THREE TIMES A DAY WITH MEALS   No facility-administered encounter medications on file as of 10/31/2021.    Surgical History: Past Surgical History:  Procedure Laterality Date   LEFT HEART CATH AND CORONARY ANGIOGRAPHY Left 04/12/2018   Procedure: LEFT HEART CATH AND CORONARY ANGIOGRAPHY;  Surgeon: Minna Merritts, MD;  Location: [place] [facility];  Service: Cardiovascular;  Laterality: Left;    Medical History: Past Medical History:  Diagnosis Date   Anxiety    Cataract    Chronic HFrEF (heart failure with reduced ejection fraction) ([place])    a. 02/2018 Echo: EF 25-30%; b. 07/2018 Echo: EF 35-40%; c. 11/2020 Echo: EF 45-50%, diast dysfxn, nl RV fxn. Trace MR/TR.   COPD (chronic obstructive pulmonary disease) (HCC)    Diabetes mellitus without complication (HCC)    NICM (nonischemic cardiomyopathy) ([place])    a. 02/2018 Echo: EF 25-30%; b. 07/2018 Echo: EF 35-40%; c. 11/2020 Echo: EF 45-50%,  diast dysfxn.   Nonobstructive CAD (coronary artery disease)    a. 03/2018 Cath: LM nl, LAD mild diff dzs prox, heavy Ca2+, 60d, LCX nl/large, OM1 50, RCA nl. EF <20%.   Sleep apnea     Family History: Family History  Problem Relation Age of Onset   Heart disease Mother    Lung cancer Father    Heart disease Maternal Grandmother    Heart Problems Maternal Grandmother    Heart Problems Brother    Heart Problems Maternal Grandfather     Social History   Socioeconomic History   Marital status: Single    Spouse name: Not on file    Number of children: Not on file   Years of education: Not on file   Highest education level: Not on file  Occupational History   Not on file  Tobacco Use   Smoking status: Former    Packs/day: 1.50    Years: 40.00    Pack years: 60.00    Types: Cigarettes   Smokeless tobacco: Never   Tobacco comments:    quit 2008  Vaping Use   Vaping Use: Never used  Substance and Sexual Activity   Alcohol use: Yes    Alcohol/week: 1.0 standard drink    Types: 1 Shots of liquor per week    Comment: occasional drink   Drug use: Never   Sexual activity: Not on file  Other Topics Concern   Not on file  Social History Narrative   Not on file   Social Determinants of Health   Financial Resource Strain: Not on file  Food Insecurity: Not on file  Transportation Needs: Not on file  Physical Activity: Not on file  Stress: Not on file  Social Connections: Not on file  Intimate Partner Violence: Not on file      Review of Systems  Constitutional:  Negative for activity change, appetite change, chills, fatigue, fever and unexpected weight change.  HENT: Negative.  Negative for congestion, ear pain, rhinorrhea, sore throat and trouble swallowing.   Eyes: Negative.   Respiratory: Negative.  Negative for cough, chest tightness, shortness of breath and wheezing.   Cardiovascular: Negative.  Negative for chest pain.  Gastrointestinal: Negative.  Negative for abdominal pain, blood in stool, constipation, diarrhea, nausea and vomiting.  Endocrine: Negative.   Genitourinary: Negative.  Negative for difficulty urinating, dysuria, frequency, hematuria and urgency.  Musculoskeletal: Negative.  Negative for arthralgias, back pain, joint swelling, myalgias and neck pain.  Skin: Negative.  Negative for rash and wound.  Allergic/Immunologic: Negative.  Negative for immunocompromised state.  Neurological: Negative.  Negative for dizziness, seizures, numbness and headaches.  Hematological: Negative.    Psychiatric/Behavioral: Negative.  Negative for behavioral problems, self-injury and suicidal ideas. The patient is not nervous/anxious.    Vital Signs: BP 121/69   Pulse 76   Temp 98.7 F (37.1 C)   Resp 16   Ht _0  (1.727 m)   Wt 232 lb (105.2 kg)   SpO2 97%   BMI 35.28 kg/m    Physical Exam Vitals reviewed.  Constitutional:      General: He is awake. He is not in acute distress.    Appearance: Normal appearance. He is well-developed and well-groomed. He is obese. He is not ill-appearing or diaphoretic.  HENT:     Head: Normocephalic and atraumatic.     Right Ear: Tympanic membrane, ear canal and external ear normal.     Left Ear: Tympanic membrane, ear canal and external  ear normal.     Nose: Nose normal. No congestion or rhinorrhea.     Mouth/Throat:     Lips: Pink.     Mouth: Mucous membranes are moist.     Pharynx: Oropharynx is clear. Uvula midline. No oropharyngeal exudate or posterior oropharyngeal erythema.  Eyes:     General: Lids are normal. Vision grossly intact. Gaze aligned appropriately. No scleral icterus.       Right eye: No discharge.        Left eye: No discharge.     Extraocular Movements: Extraocular movements intact.     Conjunctiva/sclera: Conjunctivae normal.     Pupils: Pupils are equal, round, and reactive to light.     Funduscopic exam:    Right eye: Red reflex present.        Left eye: Red reflex present. Neck:     Thyroid: No thyromegaly.     Vascular: No JVD.     Trachea: Trachea and phonation normal. No tracheal deviation.  Cardiovascular:     Rate and Rhythm: Normal rate and regular rhythm.     Pulses: Normal pulses.     Heart sounds: Normal heart sounds, S1 normal and S2 normal. No murmur heard.   No friction rub. No gallop.  Pulmonary:     Effort: Pulmonary effort is normal. No accessory muscle usage or respiratory distress.     Breath sounds: Normal breath sounds and air entry. No stridor. No wheezing or rales.  Chest:      Chest wall: No tenderness.  Abdominal:     General: Bowel sounds are normal. There is no distension.     Palpations: Abdomen is soft. There is no shifting dullness, fluid wave, mass or pulsatile mass.     Tenderness: There is no abdominal tenderness. There is no guarding or rebound.  Musculoskeletal:        General: No tenderness or deformity. Normal range of motion.     Cervical back: Normal range of motion and neck supple.  Lymphadenopathy:     Cervical: No cervical adenopathy.  Skin:    General: Skin is warm and dry.     Capillary Refill: Capillary refill takes less than 2 seconds.     Coloration: Skin is not pale.     Findings: No erythema or rash.  Neurological:     Mental Status: He is alert and oriented to person, place, and time.     Cranial Nerves: No cranial nerve deficit.     Motor: No abnormal muscle tone.     Coordination: Coordination normal.     Gait: Gait normal.     Deep Tendon Reflexes: Reflexes are normal and symmetric.  Psychiatric:        Mood and Affect: Mood normal.        Behavior: Behavior normal. Behavior is cooperative.        Thought Content: Thought content normal.        Judgment: Judgment normal.       Assessment/Plan: 1. Encounter for routine adult health examination with abnormal findings Age-appropriate preventive screenings and vaccinations discussed, annual physical exam completed. Routine labs for health maintenance ordered, below. PHM updated.   2. Uncontrolled type 2 diabetes mellitus with hyperglycemia (HCC) A1C is significantly elevated, trulicity dose increased. Routine labs ordered.  - POCT HgB A1C - Microalbumin, urine - Dulaglutide 3 MG/0.5ML SOPN; Inject 3 mg into the skin once a week.  Dispense: 6 mL; Refill: 2 - CBC with Differential/Platelet - CMP14+EGFR -  TSH + free T4  3. Essential hypertension Blood pressure is stable, on multiple medications that control BP.   4. Chronic obstructive pulmonary disease, unspecified  COPD type ([place]) Routine labs ordered, currently stable.  - CBC with Differential/Platelet - Lipid Profile  5. Obstructive sleep apnea Wears cpap which has been helping with his sleep, he reports no issues with his cpap machine.   6. Chronic HFrEF (heart failure with reduced ejection fraction) ([place]) Followed by cardiology. He takes entresto, metoprolol, lasix and spironolactone. Routine labs ordered.  - CBC with Differential/Platelet - TSH + free T4  7. Mixed hyperlipidemia Routine labs ordered - Lipid Profile - TSH + free T4  8. Vitamin D deficiency Rule out low vitamin D - Vitamin D (25 hydroxy)  9. Dysuria Routine urinalysis done - UA/M w/rflx Culture, Routine - Microscopic Examination  10. Class 2 severe obesity due to excess calories with serious comorbidity and body mass index (BMI) of 35.0 to 35.9 in adult [facility]) Diet modifications related to diabetes discussed with patient, trulicity dose increased. Both interventions may aid in weight loss as well.  Obesity Counseling: Risk Assessment: An assessment of behavioral risk factors was made today and includes lack of exercise sedentary lifestyle, lack of portion control and poor dietary habits. The patient has been screened for diabetes and a metabolic test to determine basal metabolic rate has been performed.   Risk Modification Advice: The patient was counseled on portion control guidelines. Safe recommendations on caloric restriction discussed with patient. Sample meals plans were provided. General guidelines on diet and lifestyle modifications were discussed at length. Introducing physical activity as tolerated and at the patient's ability level is recommended.    - TSH + free T4     General Counseling: Calogero verbalizes understanding of the findings of todays visit and agrees with plan of treatment. I have discussed any further diagnostic evaluation that may be needed or ordered today. We also reviewed his medications  today. he has been encouraged to call the office with any questions or concerns that should arise related to todays visit.    Orders Placed This Encounter  Procedures   Microscopic Examination   UA/M w/rflx Culture, Routine   Microalbumin, urine   CBC with Differential/Platelet   CMP14+EGFR   Lipid Profile   Vitamin D (25 hydroxy)   TSH + free T4   POCT HgB A1C    Meds ordered this encounter  Medications   Dulaglutide 3 MG/0.5ML SOPN    Sig: Inject 3 mg into the skin once a week.    Dispense:  6 mL    Refill:  2    Return in about 3 months (around 01/29/2022) for F/U, Recheck A1C, Joe Berry PCP.   Total time spent:30 Minutes Time spent includes review of chart, medications, test results, and follow up plan with the patient.   [place] Controlled Substance Database was reviewed by me.  This patient was seen by Jonetta Osgood, FNP-C in collaboration with Dr. Clayborn Bigness as a part of collaborative care agreement.  Joe Gaiser R. Valetta Fuller, MSN, FNP-C Internal medicine

## 2021-11-01 ENCOUNTER — Encounter: Payer: Self-pay | Admitting: Nurse Practitioner

## 2021-11-01 LAB — UA/M W/RFLX CULTURE, ROUTINE
Bilirubin, UA: NEGATIVE
Ketones, UA: NEGATIVE
Leukocytes,UA: NEGATIVE
Nitrite, UA: NEGATIVE
Protein,UA: NEGATIVE
RBC, UA: NEGATIVE
Specific Gravity, UA: 1.011 (ref 1.005–1.030)
Urobilinogen, Ur: 0.2 mg/dL (ref 0.2–1.0)
pH, UA: 5.5 (ref 5.0–7.5)

## 2021-11-01 LAB — MICROSCOPIC EXAMINATION
Bacteria, UA: NONE SEEN
Casts: NONE SEEN /lpf
Epithelial Cells (non renal): NONE SEEN /hpf (ref 0–10)
RBC, Urine: NONE SEEN /hpf (ref 0–2)
WBC, UA: NONE SEEN /hpf (ref 0–5)

## 2021-11-01 LAB — MICROALBUMIN, URINE: Microalbumin, Urine: 11.1 ug/mL

## 2021-11-04 ENCOUNTER — Other Ambulatory Visit: Payer: Self-pay

## 2021-11-04 DIAGNOSIS — E1165 Type 2 diabetes mellitus with hyperglycemia: Secondary | ICD-10-CM

## 2021-11-04 MED ORDER — GLIMEPIRIDE 2 MG PO TABS: 2.0000 mg | ORAL_TABLET | Freq: Every day | ORAL | 1 refills | Status: DC

## 2021-11-11 ENCOUNTER — Other Ambulatory Visit: Payer: Self-pay | Admitting: Cardiovascular Disease

## 2021-11-17 ENCOUNTER — Other Ambulatory Visit: Payer: Self-pay

## 2021-11-17 DIAGNOSIS — E1165 Type 2 diabetes mellitus with hyperglycemia: Secondary | ICD-10-CM

## 2021-11-17 DIAGNOSIS — K219 Gastro-esophageal reflux disease without esophagitis: Secondary | ICD-10-CM

## 2021-11-17 LAB — CMP14+EGFR
ALT: 21 IU/L (ref 0–44)
AST: 18 IU/L (ref 0–40)
Albumin/Globulin Ratio: 1.4 (ref 1.2–2.2)
Albumin: 4.2 g/dL (ref 3.8–4.8)
Alkaline Phosphatase: 38 IU/L — ABNORMAL LOW (ref 44–121)
BUN/Creatinine Ratio: 19 (ref 10–24)
BUN: 18 mg/dL (ref 8–27)
Bilirubin Total: 0.3 mg/dL (ref 0.0–1.2)
CO2: 24 mmol/L (ref 20–29)
Calcium: 9.3 mg/dL (ref 8.6–10.2)
Chloride: 99 mmol/L (ref 96–106)
Creatinine, Ser: 0.95 mg/dL (ref 0.76–1.27)
Globulin, Total: 3 g/dL (ref 1.5–4.5)
Glucose: 177 mg/dL — ABNORMAL HIGH (ref 70–99)
Potassium: 4.7 mmol/L (ref 3.5–5.2)
Sodium: 139 mmol/L (ref 134–144)
Total Protein: 7.2 g/dL (ref 6.0–8.5)
eGFR: 88 mL/min/{1.73_m2} (ref >59)

## 2021-11-17 LAB — CBC WITH DIFFERENTIAL/PLATELET
Basophils Absolute: 0.1 10*3/uL (ref 0.0–0.2)
Basos: 1 %
EOS (ABSOLUTE): 0.3 10*3/uL (ref 0.0–0.4)
Eos: 4 %
Hematocrit: 43.3 % (ref 37.5–51.0)
Hemoglobin: 14.4 g/dL (ref 13.0–17.7)
Immature Grans (Abs): 0 10*3/uL (ref 0.0–0.1)
Immature Granulocytes: 0 %
Lymphocytes Absolute: 1.5 10*3/uL (ref 0.7–3.1)
Lymphs: 20 %
MCH: 29 pg (ref 26.6–33.0)
MCHC: 33.3 g/dL (ref 31.5–35.7)
MCV: 87 fL (ref 79–97)
Monocytes Absolute: 0.7 10*3/uL (ref 0.1–0.9)
Monocytes: 10 %
Neutrophils Absolute: 4.8 10*3/uL (ref 1.4–7.0)
Neutrophils: 65 %
Platelets: 255 10*3/uL (ref 150–450)
RBC: 4.97 x10E6/uL (ref 4.14–5.80)
RDW: 12.9 % (ref 11.6–15.4)
WBC: 7.3 10*3/uL (ref 3.4–10.8)

## 2021-11-17 LAB — VITAMIN D 25 HYDROXY (VIT D DEFICIENCY, FRACTURES): Vit D, 25-Hydroxy: 24.3 ng/mL — ABNORMAL LOW (ref 30.0–100.0)

## 2021-11-17 LAB — TSH+FREE T4
Free T4: 1.21 ng/dL (ref 0.82–1.77)
TSH: 1.52 u[IU]/mL (ref 0.450–4.500)

## 2021-11-17 LAB — LIPID PANEL
Chol/HDL Ratio: 2.8 ratio (ref 0.0–5.0)
Cholesterol, Total: 91 mg/dL — ABNORMAL LOW (ref 100–199)
HDL: 32 mg/dL — ABNORMAL LOW (ref >39)
LDL Chol Calc (NIH): 37 mg/dL (ref 0–99)
Triglycerides: 119 mg/dL (ref 0–149)
VLDL Cholesterol Cal: 22 mg/dL (ref 5–40)

## 2021-11-17 MED ORDER — METFORMIN HCL 500 MG PO TABS: ORAL_TABLET | ORAL | 1 refills | Status: DC

## 2021-11-17 MED ORDER — GLIMEPIRIDE 2 MG PO TABS: 2.0000 mg | ORAL_TABLET | Freq: Every day | ORAL | 1 refills | Status: DC

## 2021-11-17 MED ORDER — OMEPRAZOLE 20 MG PO CPDR: 20.0000 mg | DELAYED_RELEASE_CAPSULE | Freq: Every day | ORAL | 1 refills | Status: DC

## 2021-11-17 NOTE — Telephone Encounter (Signed)
Pt called and advised that his insurance is changing to [place] on 11/20/2021 and is needing [place] to switch his prescriptions to [facility].  I informed pt that I will [facility].

## 2021-11-23 ENCOUNTER — Ambulatory Visit: Payer: Medicare Other

## 2021-11-30 ENCOUNTER — Telehealth: Payer: Self-pay

## 2021-11-30 NOTE — Telephone Encounter (Signed)
[employer] sent fax, Pt is APPROVED for Entresto until 11/19/2022 at no cost to patient  °

## 2021-12-13 ENCOUNTER — Telehealth: Payer: Self-pay

## 2021-12-13 NOTE — Telephone Encounter (Signed)
-----   Message from Jonetta Osgood, NP sent at 12/13/2021  8:32 AM EST ----- Please call patient with lab results: -thyroid levels are normal. -CBC is normal -metabolic panel is grossly normal -vitamin D level is slightly low, OTC vitamin D supplement of his choice is recommended if he is not already taking one.  -lipid panel is good. Continue rosuvastatin

## 2021-12-13 NOTE — Progress Notes (Signed)
Please call patient with lab results: -thyroid levels are normal. -CBC is normal -metabolic panel is grossly normal -vitamin D level is slightly low, OTC vitamin D supplement of his choice is recommended if he is not already taking one.  -lipid panel is good. Continue rosuvastatin

## 2021-12-15 NOTE — Progress Notes (Signed)
Cardiology Office Note  Date:  12/16/2021   ID:  Joe Berry, DOB 09-12-1956, MRN [id number]  PCP:  Jonetta Osgood, NP   Chief Complaint  Patient presents with   6 month follow up     "Doing well." Medications reviewed by the patient verbally.     HPI:   Mr. Ajit Yohey is a 66 year old gentleman with past medical history of Smoking, quit 2008 Anxiety Coronary calcifications on CT scan Emphysema/COPD OSA, started CPAP  summer 2019 Shortness of breath, Ejection fraction 25-30%  35 to 40% in 07/2018 45 to 50% in 2022 Cardiac cath 04/12/2018  Nonobstructive disease with reduced LV function Who presents for follow-up of his dilated nonischemic cardiomyopathy  In follow-up today, reports weight trending higher Up 15 pounds in the past year and a half A1c now greater than 8, Trulicity recently doubled several weeks ago  No regular exercise program In the past blood pressure well controlled, has not been checking recently  Denies any shortness of breath or chest pain on exertion No lower extremity edema, no abdominal distention, no PND orthopnea History of sleep apnea, tolerating CPAP Taking Lasix 40 daily with potassium  Discussed prior echocardiogram no primary care office 2022, ejection fraction estimated 45 to 50% Unable to evaluate images  He has retired, 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[employer], as hobby  Lab work reviewed  Total chol 99, LDL 37  EKG personally reviewed by myself on todays visit Shows normal sinus rhythm rate 86 bpm no significant ST-T wave changes  Other past medical history reviewed right Heart cath (very difficult procedure as unable to measure pressures in the PA and wedge) RA pressures: 25 mm Hg RV pressures: 77/17/mean of 25 LVEDP: 32 mm Hg  CT scan images showing significant coronary calcification all 3 vessels  Echocardiogram  severely depressed LV function moderately dilated LV ejection fraction 25-30% dated 03/19/2018   history of sleep apnea and he  is non compliant with CPAP therapy.     PMH:   has a past medical history of Anxiety, Cataract, Chronic HFrEF (heart failure with reduced ejection fraction) ([place]), COPD (chronic obstructive pulmonary disease) ([place]), Diabetes mellitus without complication ([place]), NICM (nonischemic cardiomyopathy) ([place]), Nonobstructive CAD (coronary artery disease), and Sleep apnea.  PSH:    Past Surgical History:  Procedure Laterality Date   LEFT HEART CATH AND CORONARY ANGIOGRAPHY Left 04/12/2018   Procedure: LEFT HEART CATH AND CORONARY ANGIOGRAPHY;  Surgeon: Minna Merritts, MD;  Location: [facility];  Service: Cardiovascular;  Laterality: Left;    Current Outpatient Medications  Medication Sig Dispense Refill   ACCU-CHEK FASTCLIX LANCETS MISC Use to test blood sugars twice daily e11.65 102 each 11   aspirin EC 81 MG tablet Take 1 tablet (81 mg total) by mouth daily. 90 tablet 3   diphenhydrAMINE HCl 50 MG/30ML LIQD Take 30 mLs by mouth at bedtime as needed (sleep).     Dulaglutide 3 MG/0.5ML SOPN Inject 3 mg into the skin once a week. 6 mL 2   furosemide (LASIX) 40 MG tablet Take 1 tablet (40 mg total) by mouth daily. 90 tablet 2   glimepiride (AMARYL) 2 MG tablet Take 1 tablet (2 mg total) by mouth daily before breakfast. 90 tablet 1   metFORMIN (GLUCOPHAGE) 500 MG tablet TAKE 1 TABLET THREE TIMES A DAY WITH MEALS 270 tablet 1   metoprolol succinate (TOPROL-XL) 50 MG 24 hr tablet TAKE 1 TABLET DAILY. TAKE  WITH OR IMMEDIATELY  FOLLOWING A MEAL 90 tablet 3   omeprazole (PRILOSEC) 20 MG capsule Take 1 capsule (20 mg total) by mouth daily. 90 capsule 1   potassium chloride (KLOR-CON) 10 MEQ tablet TAKE 1 TABLET DAILY 90 tablet 0   rosuvastatin (CRESTOR) 10 MG tablet TAKE 1 TABLET DAILY 90 tablet 3   sacubitril-valsartan (ENTRESTO) 49-51 MG Take 1 tablet by mouth 2 (two) times daily. 180 tablet 3   spironolactone (ALDACTONE) 25 MG tablet TAKE 1 TABLET DAILY 90 tablet 0   No current  facility-administered medications for this visit.    Allergies:   Patient has no known allergies.   Social History:  The patient  reports that he has quit smoking. His smoking use included cigarettes. He has a 60.00 pack-year smoking history. He has never used smokeless tobacco. He reports current alcohol use of about 1.0 standard drink per week. He reports that he does not use drugs.   Family History:   family history includes Heart Problems in his brother, maternal grandfather, and maternal grandmother; Heart disease in his maternal grandmother and mother; Lung cancer in his father.    Review of Systems  Constitutional: Negative.   HENT: Negative.    Respiratory: Negative.    Cardiovascular: Negative.   Gastrointestinal: Negative.   Musculoskeletal: Negative.   Neurological: Negative.   Psychiatric/Behavioral: Negative.    All other systems reviewed and are negative.  PHYSICAL EXAM: VS:  BP 126/80 (BP Location: Left Arm, Patient Position: Sitting, Cuff Size: Normal)    Pulse 86    Ht 5\' 8"  (1.727 m)    Wt 229 lb 8 oz (104.1 kg)    SpO2 98%    BMI 34.90 kg/m  , BMI Body mass index is 34.9 kg/m. Constitutional:  oriented to person, place, and time. No distress.  HENT:  Head: Grossly normal Eyes:  no discharge. No scleral icterus.  Neck: No JVD, no carotid bruits  Cardiovascular: Regular rate and rhythm, no murmurs appreciated Pulmonary/Chest: Clear to auscultation bilaterally, no wheezes or rails Abdominal: Soft.  no distension.  no tenderness.  Musculoskeletal: Normal range of motion Neurological:  normal muscle tone. Coordination normal. No atrophy Skin: Skin warm and dry Psychiatric: normal affect, pleasant   Recent Labs: 11/16/2021: ALT 21; BUN 18; Creatinine, Ser 0.95; Hemoglobin 14.4; Platelets 255; Potassium 4.7; Sodium 139; TSH 1.520    Lipid Panel Lab Results  Component Value Date   CHOL 91 (L) 11/16/2021   HDL 32 (L) 11/16/2021   LDLCALC 37 11/16/2021   TRIG  119 11/16/2021      Wt Readings from Last 3 Encounters:  12/16/21 229 lb 8 oz (104.1 kg)  10/31/21 232 lb (105.2 kg)  08/01/21 227 lb 9.6 oz (103.2 kg)     ASSESSMENT AND PLAN:  Dilated cardiomyopathy (HCC) -  Continue Entresto, metoprolol, Lasix daily with spironolactone Discussed increasing Entresto, he will monitor blood pressure at home first Discussed adding Jardiance 10 mg daily, he will talk with primary care who is adjusting his diabetes medications.  Recent increase in Trulicity   Hyperlipidemia Cholesterol is at goal on the current lipid regimen. No changes to the medications were made.  Empyema lung (HCC) Prior CT scan showing COPD Prior smoking history, not recently Recommended weight loss, walking program  SOB (shortness of breath) Lifestyle modification recommended, discussed dietary changes  Coronary artery calcification with stable angina CT scan with diffuse heavy calcification LAD, circumflex and RCA Nonobstructive coronary artery disease seen on cardiac catheterization Cholesterol at goal,  A1c running higher, exacerbated by weight gain  Cardiomyopathy Nonischemic Ejection fraction initially 20% up to 35% 2019 Continue Entresto spironolactone metoprolol Lasix As above consider adding Jardiance We will talk with primary care, given recent increase in Trulicity, need to repeat A1c but would like to benefit regardless He did have issues with Farxiga, GI upset.   Total encounter time more than 25 minutes  Greater than 50% was spent in counseling and coordination of care with the patient   No orders of the defined types were placed in this encounter.    Signed, Esmond Plants, M.D., Ph.D. 12/16/2021  [place], [place]

## 2021-12-16 ENCOUNTER — Ambulatory Visit (INDEPENDENT_AMBULATORY_CARE_PROVIDER_SITE_OTHER): Payer: Medicare Other | Admitting: Cardiovascular Disease

## 2021-12-16 ENCOUNTER — Other Ambulatory Visit: Payer: Self-pay

## 2021-12-16 ENCOUNTER — Encounter: Payer: Self-pay | Admitting: Cardiovascular Disease

## 2021-12-16 VITALS — BP 126/80 | HR 86 | Ht 68.0 in | Wt 229.5 lb

## 2021-12-16 DIAGNOSIS — J869 Pyothorax without fistula: Secondary | ICD-10-CM | POA: Diagnosis not present

## 2021-12-16 DIAGNOSIS — K219 Gastro-esophageal reflux disease without esophagitis: Secondary | ICD-10-CM

## 2021-12-16 DIAGNOSIS — I200 Unstable angina: Secondary | ICD-10-CM | POA: Diagnosis not present

## 2021-12-16 DIAGNOSIS — I428 Other cardiomyopathies: Secondary | ICD-10-CM | POA: Diagnosis not present

## 2021-12-16 DIAGNOSIS — I5022 Chronic systolic (congestive) heart failure: Secondary | ICD-10-CM

## 2021-12-16 DIAGNOSIS — I272 Pulmonary hypertension, unspecified: Secondary | ICD-10-CM | POA: Diagnosis not present

## 2021-12-16 DIAGNOSIS — E119 Type 2 diabetes mellitus without complications: Secondary | ICD-10-CM

## 2021-12-16 DIAGNOSIS — Z794 Long term (current) use of insulin: Secondary | ICD-10-CM

## 2021-12-16 MED ORDER — METOPROLOL SUCCINATE ER 50 MG PO TB24: 50.0000 mg | ORAL_TABLET | Freq: Every day | ORAL | 3 refills | Status: DC

## 2021-12-16 MED ORDER — SPIRONOLACTONE 25 MG PO TABS: 25.0000 mg | ORAL_TABLET | Freq: Every day | ORAL | 3 refills | Status: DC

## 2021-12-16 MED ORDER — POTASSIUM CHLORIDE ER 10 MEQ PO TBCR: 10.0000 meq | EXTENDED_RELEASE_TABLET | Freq: Every day | ORAL | 3 refills | Status: DC

## 2021-12-16 MED ORDER — FUROSEMIDE 40 MG PO TABS: 40.0000 mg | ORAL_TABLET | Freq: Every day | ORAL | 3 refills | Status: DC

## 2021-12-16 MED ORDER — ROSUVASTATIN CALCIUM 10 MG PO TABS: 10.0000 mg | ORAL_TABLET | Freq: Every day | ORAL | 3 refills | Status: DC

## 2021-12-16 NOTE — Patient Instructions (Addendum)
Medication Instructions:  No changes  Refills for Joe Berry is handle through the [employer] patient assistance company, when refills needed, you call the company Phone: [phone]  Cardiac refills sent to [facility]   Talk to PMD about adding jardiance  If you need a refill on your cardiac medications before your next appointment, please call your pharmacy.   Lab work: No new labs needed  Testing/Procedures: No new testing needed  Follow-Up: At [facility], you and your health needs are our priority.  As part of our continuing mission to provide you with exceptional heart care, we have created designated Provider Care Teams.  These Care Teams include your primary Cardiologist (physician) and Advanced Practice Providers (APPs -  Physician Assistants and Nurse Practitioners) who all work together to provide you with the care you need, when you need it.  You will need a follow up appointment in 12 months  Providers on your designated Care Team:   Murray Hodgkins, NP Christell Faith, PA-C Cadence Kathlen Mody, [place]  COVID-19 Vaccine Information can be found at: [email or web address].uk For questions related to vaccine distribution or appointments, please email [email or web address] or call [phone].

## 2022-01-04 ENCOUNTER — Ambulatory Visit: Payer: Medicare Other

## 2022-01-30 ENCOUNTER — Other Ambulatory Visit: Payer: Self-pay

## 2022-01-30 ENCOUNTER — Encounter: Payer: Self-pay | Admitting: Nurse Practitioner

## 2022-01-30 ENCOUNTER — Ambulatory Visit (INDEPENDENT_AMBULATORY_CARE_PROVIDER_SITE_OTHER): Payer: Medicare Other | Admitting: Nurse Practitioner

## 2022-01-30 VITALS — BP 131/80 | HR 83 | Temp 98.6°F | Resp 16 | Ht 68.0 in | Wt 228.6 lb

## 2022-01-30 DIAGNOSIS — I5022 Chronic systolic (congestive) heart failure: Secondary | ICD-10-CM | POA: Diagnosis not present

## 2022-01-30 DIAGNOSIS — E782 Mixed hyperlipidemia: Secondary | ICD-10-CM

## 2022-01-30 DIAGNOSIS — G4733 Obstructive sleep apnea (adult) (pediatric): Secondary | ICD-10-CM | POA: Diagnosis not present

## 2022-01-30 DIAGNOSIS — E559 Vitamin D deficiency, unspecified: Secondary | ICD-10-CM

## 2022-01-30 DIAGNOSIS — I10 Essential (primary) hypertension: Secondary | ICD-10-CM

## 2022-01-30 DIAGNOSIS — E1165 Type 2 diabetes mellitus with hyperglycemia: Secondary | ICD-10-CM

## 2022-01-30 DIAGNOSIS — K219 Gastro-esophageal reflux disease without esophagitis: Secondary | ICD-10-CM

## 2022-01-30 LAB — POCT GLYCOSYLATED HEMOGLOBIN (HGB A1C): Hemoglobin A1C: 8.4 % — AB (ref 4.0–5.6)

## 2022-01-30 MED ORDER — OMEPRAZOLE 20 MG PO CPDR: 20.0000 mg | DELAYED_RELEASE_CAPSULE | Freq: Every day | ORAL | 1 refills | Status: DC

## 2022-01-30 NOTE — Progress Notes (Cosign Needed)
[facility] [address] [place], [place] [address]  Internal MEDICINE  Office Visit Note  Patient Name: Joe Berry  [id number]  [id number]  Date of Service: 01/30/2022  Chief Complaint  Patient presents with   Follow-up   Diabetes   Anxiety   COPD     Sylvia presents for a follow-up visit for  [name] presents for follow-up visit for diabetes, hypertension, anxiety, COPD and sleep apnea.  His A1c is 8.4 today which is no change since his previous A1c in December.  He reports that his blood sugars were over 200s until a couple of weeks ago when he started changing his diet.  He has been having a sweet snack before bedtime and has decreased that he is also cut out sodas and and decreased his intake of sweet tea.  He has started checking his sugar every morning again and tracking it. He recently saw his cardiologist Dr. Mariah Milling and reports that Dr. Mariah Milling is thinking about going up on his Entresto dose so he is tracking his blood pressures for Dr. Mariah Milling and his cardiologist also recommended adding Jardiance and told him to discuss that with his PCP because it could be beneficial to his heart.  Patient tried Marcelline Deist in the past which caused uncontrollable diarrhea as a side effect.  The patient would prefer not to add any new medications at this time.  The patient reports he is currently only taking 2 metformin tablets per day where as he used to take 3 tablets/day and was wondering if he should go back up to taking 3 tablets daily. Patient has been using his CPAP but has been struggling with it and has decided that he is not going to continue using CPAP at night and that he has been sleeping better since he stopped using it.  He states that he will follow-up with Dr. Nickolas Madrid about this    Current Medication: Outpatient Encounter Medications as of 01/30/2022  Medication Sig   ACCU-CHEK FASTCLIX LANCETS MISC Use to test blood sugars twice daily e11.65   aspirin EC 81 MG tablet  Take 1 tablet (81 mg total) by mouth daily.   diphenhydrAMINE HCl 50 MG/30ML LIQD Take 30 mLs by mouth at bedtime as needed (sleep).   Dulaglutide 3 MG/0.5ML SOPN Inject 3 mg into the skin once a week.   furosemide (LASIX) 40 MG tablet Take 1 tablet (40 mg total) by mouth daily.   glimepiride (AMARYL) 2 MG tablet Take 1 tablet (2 mg total) by mouth daily before breakfast.   metFORMIN (GLUCOPHAGE) 500 MG tablet TAKE 1 TABLET THREE TIMES A DAY WITH MEALS   metoprolol succinate (TOPROL-XL) 50 MG 24 hr tablet Take 1 tablet (50 mg total) by mouth daily.   potassium chloride (KLOR-CON) 10 MEQ tablet Take 1 tablet (10 mEq total) by mouth daily.   rosuvastatin (CRESTOR) 10 MG tablet Take 1 tablet (10 mg total) by mouth daily.   sacubitril-valsartan (ENTRESTO) 49-51 MG Take 1 tablet by mouth 2 (two) times daily.   spironolactone (ALDACTONE) 25 MG tablet Take 1 tablet (25 mg total) by mouth daily.   [DISCONTINUED] omeprazole (PRILOSEC) 20 MG capsule Take 1 capsule (20 mg total) by mouth daily.   omeprazole (PRILOSEC) 20 MG capsule Take 1 capsule (20 mg total) by mouth daily.   No facility-administered encounter medications on file as of 01/30/2022.    Surgical History: Past Surgical History:  Procedure Laterality Date   LEFT HEART CATH AND CORONARY ANGIOGRAPHY Left  04/12/2018   Procedure: LEFT HEART CATH AND CORONARY ANGIOGRAPHY;  Surgeon: Antonieta Iba, MD;  Location: [facility];  Service: Cardiovascular;  Laterality: Left;    Medical History: Past Medical History:  Diagnosis Date   Anxiety    Cataract    Chronic HFrEF (heart failure with reduced ejection fraction) (HCC)    a. 02/2018 Echo: EF 25-30%; b. 07/2018 Echo: EF 35-40%; c. 11/2020 Echo: EF 45-50%, diast dysfxn, nl RV fxn. Trace MR/TR.   COPD (chronic obstructive pulmonary disease) (HCC)    Diabetes mellitus without complication (HCC)    NICM (nonischemic cardiomyopathy) (HCC)    a. 02/2018 Echo: EF 25-30%; b. 07/2018 Echo: EF  35-40%; c. 11/2020 Echo: EF 45-50%, diast dysfxn.   Nonobstructive CAD (coronary artery disease)    a. 03/2018 Cath: LM nl, LAD mild diff dzs prox, heavy Ca2+, 60d, LCX nl/large, OM1 50, RCA nl. EF <20%.   Sleep apnea     Family History: Family History  Problem Relation Age of Onset   Heart disease Mother    Lung cancer Father    Heart disease Maternal Grandmother    Heart Problems Maternal Grandmother    Heart Problems Brother    Heart Problems Maternal Grandfather     Social History   Socioeconomic History   Marital status: Single    Spouse name: Not on file   Number of children: Not on file   Years of education: Not on file   Highest education level: Not on file  Occupational History   Not on file  Tobacco Use   Smoking status: Former    Packs/day: 1.50    Years: 40.00    Pack years: 60.00    Types: Cigarettes   Smokeless tobacco: Never   Tobacco comments:    quit 2008  Vaping Use   Vaping Use: Never used  Substance and Sexual Activity   Alcohol use: Yes    Alcohol/week: 1.0 standard drink    Types: 1 Shots of liquor per week    Comment: occasional drink   Drug use: Never   Sexual activity: Not on file  Other Topics Concern   Not on file  Social History Narrative   Not on file   Social Determinants of Health   Financial Resource Strain: Not on file  Food Insecurity: Not on file  Transportation Needs: Not on file  Physical Activity: Not on file  Stress: Not on file  Social Connections: Not on file  Intimate Partner Violence: Not on file      Review of Systems  Constitutional:  Negative for chills, fatigue and unexpected weight change.  HENT:  Negative for congestion, rhinorrhea, sneezing and sore throat.   Eyes:  Negative for redness.  Respiratory: Negative.  Negative for cough, chest tightness and shortness of breath.   Cardiovascular: Negative.  Negative for chest pain and palpitations.  Gastrointestinal:  Negative for abdominal pain,  constipation, diarrhea, nausea and vomiting.  Genitourinary:  Negative for dysuria and frequency.  Musculoskeletal:  Negative for arthralgias, back pain, joint swelling and neck pain.  Skin:  Negative for rash.  Neurological: Negative.  Negative for tremors and numbness.  Hematological:  Negative for adenopathy. Does not bruise/bleed easily.  Psychiatric/Behavioral:  Negative for behavioral problems (Depression), sleep disturbance and suicidal ideas. The patient is not nervous/anxious.    Vital Signs: BP 131/80    Pulse 83    Temp 98.6 F (37 C)    Resp 16  Ht  (1.727 m)    Wt 228 lb 9.6 oz (103.7 kg)    SpO2 98%    BMI 34.76 kg/m    Physical Exam Vitals reviewed.  Constitutional:      General: He is not in acute distress.    Appearance: Normal appearance. He is not ill-appearing.  HENT:     Head: Normocephalic and atraumatic.  Eyes:     Pupils: Pupils are equal, round, and reactive to light.  Cardiovascular:     Rate and Rhythm: Normal rate and regular rhythm.  Pulmonary:     Effort: Pulmonary effort is normal. No respiratory distress.  Neurological:     Mental Status: He is alert and oriented to person, place, and time.     Cranial Nerves: No cranial nerve deficit.     Coordination: Coordination normal.     Gait: Gait normal.  Psychiatric:        Mood and Affect: Mood normal.        Behavior: Behavior normal.       Assessment/Plan: 1. Uncontrolled type 2 diabetes mellitus with hyperglycemia (HCC) A1c remains elevated at 8.4, no change since his previous A1c in December.  Patient is working on adjusting his diet according to ADA regulations.  He is tracking his sugars and checking them at least once a day.  He is going back to taking 2 metformin tablets in the morning and 1 in the evening with meals.  We will follow-up in 3 months with repeat A1c.  If still no change in his A1c or worsening of the A1c, will discuss increasing Trulicity dose to 4.5 mg weekly. - POCT  glycosylated hemoglobin (Hb A1C)  2. Essential hypertension Blood pressure is stable and medications are being managed by his cardiologist Dr. Mariah Milling  3. Chronic HFrEF (heart failure with reduced ejection fraction) (HCC) Patient is taking Entresto.  Patient is tracking his blood pressures because Dr. Mariah Milling is considering increasing his Entresto dose.  He will be following up with Dr. Mariah Milling in approximately 6 months  4. Obstructive sleep apnea Patient has decided to not use CPAP anymore despite recommendations.  He reports he is sleeping better without CPAP.  He will follow-up with Dr. Nickolas Madrid at a future visit in April and discuss his CPAP.  5. Gastroesophageal reflux disease without esophagitis  patient has new insurance and has switched mail-order pharmacies and needs his omeprazole sent to the new pharmacy.  Refill sent - omeprazole (PRILOSEC) 20 MG capsule; Take 1 capsule (20 mg total) by mouth daily.  Dispense: 90 capsule; Refill: 1  6. Mixed hyperlipidemia Patient is taking rosuvastatin, labs were previously discussed with patient.  Continue rosuvastatin as prescribed  7. Vitamin D deficiency Patient vitamin D level slightly low at 24.3 which was previously discussed with the patient via phone call.  Patient was recommended to take an over-the-counter vitamin D supplement of at least 2000 units daily.   General Counseling: zykeem bauserman understanding of the findings of todays visit and agrees with plan of treatment. I have discussed any further diagnostic evaluation that may be needed or ordered today. We also reviewed his medications today. he has been encouraged to call the office with any questions or concerns that should arise related to todays visit.    Orders Placed This Encounter  Procedures   POCT glycosylated hemoglobin (Hb A1C)    Meds ordered this encounter  Medications   omeprazole (PRILOSEC) 20 MG capsule    Sig: Take 1  capsule (20 mg total) by mouth  daily.    Dispense:  90 capsule    Refill:  1    Rx valid as of 11/20/2021    Return in about 3 months (around 05/02/2022) for F/U, Recheck A1C.   Total time spent: 30 minutes Time spent includes review of chart, medications, test results, and follow up plan with the patient.   [place] Controlled Substance Database was reviewed by me.  This patient was seen by Sallyanne Kuster, FNP-C in collaboration with Dr. Beverely Risen as a part of collaborative care agreement.   Conner Muegge R. Tedd Sias, MSN, FNP-C Internal medicine

## 2022-02-20 ENCOUNTER — Encounter: Payer: Self-pay | Admitting: Nurse Practitioner

## 2022-02-20 ENCOUNTER — Ambulatory Visit (INDEPENDENT_AMBULATORY_CARE_PROVIDER_SITE_OTHER): Payer: Medicare Other | Admitting: Nurse Practitioner

## 2022-02-20 VITALS — BP 116/84 | HR 81 | Temp 98.6°F | Resp 16 | Ht 67.0 in | Wt 232.0 lb

## 2022-02-20 DIAGNOSIS — J432 Centrilobular emphysema: Secondary | ICD-10-CM | POA: Diagnosis not present

## 2022-02-20 DIAGNOSIS — G4733 Obstructive sleep apnea (adult) (pediatric): Secondary | ICD-10-CM

## 2022-02-20 DIAGNOSIS — I272 Pulmonary hypertension, unspecified: Secondary | ICD-10-CM | POA: Diagnosis not present

## 2022-02-20 DIAGNOSIS — Z6835 Body mass index (BMI) 35.0-35.9, adult: Secondary | ICD-10-CM

## 2022-02-20 DIAGNOSIS — I200 Unstable angina: Secondary | ICD-10-CM

## 2022-02-20 DIAGNOSIS — R918 Other nonspecific abnormal finding of lung field: Secondary | ICD-10-CM

## 2022-02-20 NOTE — Progress Notes (Signed)
[facility] ?[address] ?[place], [place] [address] ? ?Internal MEDICINE  ?Office Visit Note ? ?Patient Name: Joe Berry ? [id number]  ?[id number] ? ?Date of Service: 02/20/2022 ? ?Chief Complaint  ?Patient presents with  ? COPD  ? Sleep Apnea  ? ? ?HPI ?Toris presents for a follow up visit for COPD, emphysema. He is stable, denies SOB, wheezing or cough. He does not use a maintenance inhaler or rescue inhaler. He has tried a maintenance inhaler in the past but did not notice any benefit from them. He does not feel like he needs a rescue inhaler but will call the clinic if his respiratory status changes.  ?He did try CPAP but was unable to tolerate the machine. He is not using it anymore. He feels like his sleep is good and wakes up feeling rested.  ?He has a couple of lung nodules in the right upper and lower lung lobes that were stable x3 years according to the last CT chest done in November 2021.  ? ? ? ?Current Medication: ?Outpatient Encounter Medications as of 02/20/2022  ?Medication Sig  ? ACCU-CHEK FASTCLIX LANCETS MISC Use to test blood sugars twice daily e11.65  ? aspirin EC 81 MG tablet Take 1 tablet (81 mg total) by mouth daily.  ? diphenhydrAMINE HCl 50 MG/30ML LIQD Take 30 mLs by mouth at bedtime as needed (sleep).  ? Dulaglutide 3 MG/0.5ML SOPN Inject 3 mg into the skin once a week.  ? furosemide (LASIX) 40 MG tablet Take 1 tablet (40 mg total) by mouth daily.  ? glimepiride (AMARYL) 2 MG tablet Take 1 tablet (2 mg total) by mouth daily before breakfast.  ? metFORMIN (GLUCOPHAGE) 500 MG tablet TAKE 1 TABLET THREE TIMES A DAY WITH MEALS  ? metoprolol succinate (TOPROL-XL) 50 MG 24 hr tablet Take 1 tablet (50 mg total) by mouth daily.  ? omeprazole (PRILOSEC) 20 MG capsule Take 1 capsule (20 mg total) by mouth daily.  ? potassium chloride (KLOR-CON) 10 MEQ tablet Take 1 tablet (10 mEq total) by mouth daily.  ? rosuvastatin (CRESTOR) 10 MG tablet Take 1 tablet (10 mg total) by mouth daily.  ?  sacubitril-valsartan (ENTRESTO) 49-51 MG Take 1 tablet by mouth 2 (two) times daily.  ? spironolactone (ALDACTONE) 25 MG tablet Take 1 tablet (25 mg total) by mouth daily.  ? ?No facility-administered encounter medications on file as of 02/20/2022.  ? ? ?Surgical History: ?Past Surgical History:  ?Procedure Laterality Date  ? LEFT HEART CATH AND CORONARY ANGIOGRAPHY Left 04/12/2018  ? Procedure: LEFT HEART CATH AND CORONARY ANGIOGRAPHY;  Surgeon: Antonieta Iba, MD;  Location: [facility] INVASIVE CV LAB;  Service: Cardiovascular;  Laterality: Left;  ? ? ?Medical History: ?Past Medical History:  ?Diagnosis Date  ? Anxiety   ? Cataract   ? Chronic HFrEF (heart failure with reduced ejection fraction) (HCC)   ? a. 02/2018 Echo: EF 25-30%; b. 07/2018 Echo: EF 35-40%; c. 11/2020 Echo: EF 45-50%, diast dysfxn, nl RV fxn. Trace MR/TR.  ? COPD (chronic obstructive pulmonary disease) (HCC)   ? Diabetes mellitus without complication (HCC)   ? NICM (nonischemic cardiomyopathy) (HCC)   ? a. 02/2018 Echo: EF 25-30%; b. 07/2018 Echo: EF 35-40%; c. 11/2020 Echo: EF 45-50%, diast dysfxn.  ? Nonobstructive CAD (coronary artery disease)   ? a. 03/2018 Cath: LM nl, LAD mild diff dzs prox, heavy Ca2+, 60d, LCX nl/large, OM1 50, RCA nl. EF <20%.  ? Sleep apnea   ? ? ?Family  History: ?Family History  ?Problem Relation Age of Onset  ? Heart disease Mother   ? Lung cancer Father   ? Heart disease Maternal Grandmother   ? Heart Problems Maternal Grandmother   ? Heart Problems Brother   ? Heart Problems Maternal Grandfather   ? ? ?Social History  ? ?Socioeconomic History  ? Marital status: Single  ?  Spouse name: Not on file  ? Number of children: Not on file  ? Years of education: Not on file  ? Highest education level: Not on file  ?Occupational History  ? Not on file  ?Tobacco Use  ? Smoking status: Former  ?  Packs/day: 1.50  ?  Years: 40.00  ?  Pack years: 60.00  ?  Types: Cigarettes  ? Smokeless tobacco: Never  ? Tobacco comments:  ?  quit 2008   ?Vaping Use  ? Vaping Use: Never used  ?Substance and Sexual Activity  ? Alcohol use: Yes  ?  Alcohol/week: 1.0 standard drink  ?  Types: 1 Shots of liquor per week  ?  Comment: occasional drink  ? Drug use: Never  ? Sexual activity: Not on file  ?Other Topics Concern  ? Not on file  ?Social History Narrative  ? Not on file  ? ?Social Determinants of Health  ? ?Financial Resource Strain: Not on file  ?Food Insecurity: Not on file  ?Transportation Needs: Not on file  ?Physical Activity: Not on file  ?Stress: Not on file  ?Social Connections: Not on file  ?Intimate Partner Violence: Not on file  ? ? ? ? ?Review of Systems  ?Constitutional:  Negative for chills, fatigue and unexpected weight change.  ?HENT:  Negative for congestion, rhinorrhea, sneezing and sore throat.   ?Eyes:  Negative for redness.  ?Respiratory: Negative.  Negative for cough, chest tightness, shortness of breath and wheezing.   ?Cardiovascular: Negative.  Negative for chest pain and palpitations.  ?Gastrointestinal:  Negative for abdominal pain, constipation, diarrhea, nausea and vomiting.  ?Genitourinary:  Negative for dysuria and frequency.  ?Musculoskeletal:  Negative for arthralgias, back pain, joint swelling and neck pain.  ?Skin:  Negative for rash.  ?Neurological: Negative.  Negative for tremors and numbness.  ?Hematological:  Negative for adenopathy. Does not bruise/bleed easily.  ?Psychiatric/Behavioral:  Negative for behavioral problems (Depression), sleep disturbance and suicidal ideas. The patient is not nervous/anxious.   ? ?Vital Signs: ?BP 116/84   Pulse 81   Temp 98.6 ?F (37 ?C)   Resp 16   Ht 5\' 7"  (1.702 m)   Wt 232 lb (105.2 kg)   SpO2 98%   BMI 36.34 kg/m?  ? ? ?Physical Exam ?Vitals reviewed.  ?Constitutional:   ?   General: He is not in acute distress. ?   Appearance: Normal appearance. He is obese. He is not ill-appearing.  ?HENT:  ?   Head: Normocephalic and atraumatic.  ?Eyes:  ?   Pupils: Pupils are equal, round,  and reactive to light.  ?Cardiovascular:  ?   Rate and Rhythm: Normal rate and regular rhythm.  ?   Heart sounds: Normal heart sounds. No murmur heard. ?Pulmonary:  ?   Effort: Pulmonary effort is normal. No accessory muscle usage or respiratory distress.  ?   Breath sounds: Normal air entry. Examination of the right-upper field reveals decreased breath sounds. Examination of the right-middle field reveals decreased breath sounds. Examination of the right-lower field reveals decreased breath sounds. Decreased breath sounds present. No wheezing or rhonchi.  ?Neurological:  ?  Mental Status: He is alert and oriented to person, place, and time.  ?Psychiatric:     ?   Mood and Affect: Mood normal.     ?   Behavior: Behavior normal.  ? ? ? ? ? ?Assessment/Plan: ?1. Centrilobular emphysema (HCC) ?Stable, not on any maintenance inhaler and does not need any rescue inhaler at this time. ? ?2. Multiple pulmonary nodules determined by computed tomography of lung ?Pulmonary nodules were previously identified on the right side of the chest in the right upper lobe of the lung and the right lower lobe of the lung.  Patient currently has decreased breath sounds on the right side of the chest, will repeat CT chest to evaluate current status of pulmonary nodules.  Dr. Freda Munro is aware of plan ?- CT Chest Wo Contrast; Future ? ?3. Pulmonary HTN (HCC) ?Stable, continue to monitor.  Patient also sees cardiology and is currently taking Entresto,  Lasix and metoprolol ? ?4. OSA (obstructive sleep apnea) ?Not using CPAP anymore, patient unable to tolerate.  Reports that he feels like he is sleeping better without the machine. ? ?5. Class 2 severe obesity due to excess calories with serious comorbidity and body mass index (BMI) of 36.0 to 36.9 in adult [facility]) ?Patient has gained 4 pounds since previous office visit, breathing and sleep may improve with weight loss, patient is aware. ? ? ?General Counseling: jimar thone  understanding of the findings of todays visit and agrees with plan of treatment. I have discussed any further diagnostic evaluation that may be needed or ordered today. We also reviewed his medications today. he has been enco

## 2022-02-22 ENCOUNTER — Telehealth: Payer: Self-pay

## 2022-02-22 NOTE — Telephone Encounter (Signed)
Patient scheduled for ct chest on 03/02/22 @ 9:00 [facility].tat ?

## 2022-03-02 ENCOUNTER — Ambulatory Visit
Admission: RE | Admit: 2022-03-02 | Discharge: 2022-03-02 | Disposition: A | Discharge status: Home / Self Care | Payer: Medicare Other | Source: Ambulatory Visit | Attending: Nurse Practitioner | Admitting: Nurse Practitioner

## 2022-03-02 DIAGNOSIS — R918 Other nonspecific abnormal finding of lung field: Secondary | ICD-10-CM | POA: Diagnosis not present

## 2022-03-06 NOTE — Progress Notes (Signed)
Please call the patient and let him know that his CT chest is stable with no significant change in existing pulmonary nodules and no new pulmonary nodules identified.  He does have aortic atherosclerosis and coronary artery calcifications and mild emphysema. ?The pulmonary nodules have been stable for more than 2 years now which is consistent with the nodules being definitively benign. ?

## 2022-03-08 ENCOUNTER — Telehealth: Payer: Self-pay

## 2022-03-08 NOTE — Telephone Encounter (Signed)
-----   Message from Sallyanne Kuster, NP sent at 03/06/2022  9:32 PM EDT ----- ?Please call the patient and let him know that his CT chest is stable with no significant change in existing pulmonary nodules and no new pulmonary nodules identified.  He does have aortic atherosclerosis and coronary artery calcifications and mild emphysema. ?The pulmonary nodules have been stable for more than 2 years now which is consistent with the nodules being definitively benign. ? ?

## 2022-03-08 NOTE — Telephone Encounter (Signed)
Spoke to pt, provided results  

## 2022-05-02 ENCOUNTER — Encounter: Payer: Self-pay | Admitting: Nurse Practitioner

## 2022-05-02 ENCOUNTER — Ambulatory Visit (INDEPENDENT_AMBULATORY_CARE_PROVIDER_SITE_OTHER): Payer: Medicare Other | Admitting: Nurse Practitioner

## 2022-05-02 VITALS — BP 122/76 | HR 82 | Temp 98.7°F | Resp 16 | Ht 68.0 in | Wt 227.0 lb

## 2022-05-02 DIAGNOSIS — E1165 Type 2 diabetes mellitus with hyperglycemia: Secondary | ICD-10-CM

## 2022-05-02 DIAGNOSIS — J432 Centrilobular emphysema: Secondary | ICD-10-CM | POA: Diagnosis not present

## 2022-05-02 DIAGNOSIS — I10 Essential (primary) hypertension: Secondary | ICD-10-CM

## 2022-05-02 DIAGNOSIS — Z6835 Body mass index (BMI) 35.0-35.9, adult: Secondary | ICD-10-CM

## 2022-05-02 LAB — POCT GLYCOSYLATED HEMOGLOBIN (HGB A1C): Hemoglobin A1C: 8.3 % — AB (ref 4.0–5.6)

## 2022-05-02 MED ORDER — METFORMIN HCL ER 750 MG PO TB24: 1500.0000 mg | ORAL_TABLET | Freq: Every day | ORAL | 1 refills | Status: DC

## 2022-05-02 NOTE — Progress Notes (Signed)
[facility] [address] [place], [place] [address]  Internal MEDICINE  Office Visit Note  Patient Name: Joe Berry  [id number]  [id number]  Date of Service: 05/02/2022  Chief Complaint  Patient presents with   Follow-up   Diabetes   Anxiety   COPD    HPI Joe Berry presents for a follow-up visit for diabetes, hypertension, COPD and anxiety.  He is due to have his A1c checked and it was 8.3 today which is only slight improvement of 0.1 from 8.4 in March earlier this year.  He reports he has decreased how much he snacks on foods in the evenings and is starting to be more active walking outside.  He has lost 1 pound since his previous office visit in March.  He is currently taking Trulicity 3 mg weekly injection which she receives from the manufacturer and he takes metformin 500 mg twice daily --His blood pressure is stable and within normal limits as well as his other vital signs --Anxiety is well controlled --No significant change in his respiratory status, not currently on any maintenance inhaler.     Current Medication: Outpatient Encounter Medications as of 05/02/2022  Medication Sig   ACCU-CHEK FASTCLIX LANCETS MISC Use to test blood sugars twice daily e11.65   aspirin EC 81 MG tablet Take 1 tablet (81 mg total) by mouth daily.   diphenhydrAMINE HCl 50 MG/30ML LIQD Take 30 mLs by mouth at bedtime as needed (sleep).   Dulaglutide 3 MG/0.5ML SOPN Inject 3 mg into the skin once a week.   furosemide (LASIX) 40 MG tablet Take 1 tablet (40 mg total) by mouth daily.   glimepiride (AMARYL) 2 MG tablet Take 1 tablet (2 mg total) by mouth daily before breakfast.   metFORMIN (GLUCOPHAGE-XR) 750 MG 24 hr tablet Take 2 tablets (1,500 mg total) by mouth daily with breakfast.   metoprolol succinate (TOPROL-XL) 50 MG 24 hr tablet Take 1 tablet (50 mg total) by mouth daily.   omeprazole (PRILOSEC) 20 MG capsule Take 1 capsule (20 mg total) by mouth daily.   potassium chloride  (KLOR-CON) 10 MEQ tablet Take 1 tablet (10 mEq total) by mouth daily.   rosuvastatin (CRESTOR) 10 MG tablet Take 1 tablet (10 mg total) by mouth daily.   sacubitril-valsartan (ENTRESTO) 49-51 MG Take 1 tablet by mouth 2 (two) times daily.   spironolactone (ALDACTONE) 25 MG tablet Take 1 tablet (25 mg total) by mouth daily.   [DISCONTINUED] metFORMIN (GLUCOPHAGE) 500 MG tablet TAKE 1 TABLET THREE TIMES A DAY WITH MEALS   No facility-administered encounter medications on file as of 05/02/2022.    Surgical History: Past Surgical History:  Procedure Laterality Date   LEFT HEART CATH AND CORONARY ANGIOGRAPHY Left 04/12/2018   Procedure: LEFT HEART CATH AND CORONARY ANGIOGRAPHY;  Surgeon: Antonieta Iba, MD;  Location: [facility] INVASIVE CV LAB;  Service: Cardiovascular;  Laterality: Left;    Medical History: Past Medical History:  Diagnosis Date   Anxiety    Cataract    Chronic HFrEF (heart failure with reduced ejection fraction) (HCC)    a. 02/2018 Echo: EF 25-30%; b. 07/2018 Echo: EF 35-40%; c. 11/2020 Echo: EF 45-50%, diast dysfxn, nl RV fxn. Trace MR/TR.   COPD (chronic obstructive pulmonary disease) (HCC)    Diabetes mellitus without complication (HCC)    NICM (nonischemic cardiomyopathy) (HCC)    a. 02/2018 Echo: EF 25-30%; b. 07/2018 Echo: EF 35-40%; c. 11/2020 Echo: EF 45-50%, diast dysfxn.   Nonobstructive CAD (coronary artery  disease)    a. 03/2018 Cath: LM nl, LAD mild diff dzs prox, heavy Ca2+, 60d, LCX nl/large, OM1 50, RCA nl. EF <20%.   Sleep apnea     Family History: Family History  Problem Relation Age of Onset   Heart disease Mother    Lung cancer Father    Heart disease Maternal Grandmother    Heart Problems Maternal Grandmother    Heart Problems Brother    Heart Problems Maternal Grandfather     Social History   Socioeconomic History   Marital status: Single    Spouse name: Not on file   Number of children: Not on file   Years of education: Not on file   Highest  education level: Not on file  Occupational History   Not on file  Tobacco Use   Smoking status: Former    Packs/day: 1.50    Years: 40.00    Total pack years: 60.00    Types: Cigarettes   Smokeless tobacco: Never   Tobacco comments:    quit 2008  Vaping Use   Vaping Use: Never used  Substance and Sexual Activity   Alcohol use: Yes    Alcohol/week: 1.0 standard drink of alcohol    Types: 1 Shots of liquor per week    Comment: occasional drink   Drug use: Never   Sexual activity: Not on file  Other Topics Concern   Not on file  Social History Narrative   Not on file   Social Determinants of Health   Financial Resource Strain: Not on file  Food Insecurity: Not on file  Transportation Needs: Not on file  Physical Activity: Not on file  Stress: Not on file  Social Connections: Not on file  Intimate Partner Violence: Not on file      Review of Systems  Constitutional:  Negative for chills, fatigue and unexpected weight change.  HENT:  Negative for congestion, rhinorrhea, sneezing and sore throat.   Eyes:  Negative for redness.  Respiratory: Negative.  Negative for cough, chest tightness, shortness of breath and wheezing.   Cardiovascular: Negative.  Negative for chest pain and palpitations.  Gastrointestinal:  Negative for abdominal pain, constipation, diarrhea, nausea and vomiting.  Genitourinary:  Negative for dysuria and frequency.  Musculoskeletal:  Negative for arthralgias, back pain, joint swelling and neck pain.  Skin:  Negative for rash.  Neurological: Negative.  Negative for tremors and numbness.  Hematological:  Negative for adenopathy. Does not bruise/bleed easily.  Psychiatric/Behavioral:  Negative for behavioral problems (Depression), sleep disturbance and suicidal ideas. The patient is not nervous/anxious.     Vital Signs: BP 122/76   Pulse 82   Temp 98.7 F (37.1 C)   Resp 16   Ht 5\' 8"  (1.727 m)   Wt 227 lb (103 kg)   SpO2 97%   BMI 34.52 kg/m     Physical Exam Vitals reviewed.  Constitutional:      General: He is not in acute distress.    Appearance: Normal appearance. He is obese. He is not ill-appearing.  HENT:     Head: Normocephalic and atraumatic.  Eyes:     Pupils: Pupils are equal, round, and reactive to light.  Cardiovascular:     Rate and Rhythm: Normal rate and regular rhythm.  Pulmonary:     Effort: Pulmonary effort is normal. No respiratory distress.  Neurological:     Mental Status: He is alert and oriented to person, place, and time.  Psychiatric:  Mood and Affect: Mood normal.        Behavior: Behavior normal.        Assessment/Plan: 1. Uncontrolled type 2 diabetes mellitus with hyperglycemia (HCC) A1c slightly improved, 8.3 today.  Metformin discontinued, extended release metformin 750 mg prescribed for patient to take 2 tablets daily with breakfast.  Follow-up in 3 months to repeat A1c - POCT HgB A1C - metFORMIN (GLUCOPHAGE-XR) 750 MG 24 hr tablet; Take 2 tablets (1,500 mg total) by mouth daily with breakfast.  Dispense: 180 tablet; Refill: 1  2. Essential hypertension Blood pressure stable well-controlled with current medications  3. Centrilobular emphysema (HCC) Periodic monitoring of respiratory status, no significant changes since previous office visit  4. Class 2 severe obesity due to excess calories with serious comorbidity and body mass index (BMI) of 35.0 to 35.9 in adult [facility]) Patient has lost 1 pound since March.  With continued improvement in A1c patient also has continued decrease in weight, will follow-up in 3 months   General Counseling: Fawaz verbalizes understanding of the findings of todays visit and agrees with plan of treatment. I have discussed any further diagnostic evaluation that may be needed or ordered today. We also reviewed his medications today. he has been encouraged to call the office with any questions or concerns that should arise related to todays  visit.    Orders Placed This Encounter  Procedures   POCT HgB A1C    Meds ordered this encounter  Medications   metFORMIN (GLUCOPHAGE-XR) 750 MG 24 hr tablet    Sig: Take 2 tablets (1,500 mg total) by mouth daily with breakfast.    Dispense:  180 tablet    Refill:  1    Discontinue all previous orders for metformin, fill new prescription asap    Return for F/U, Recheck A1C, Bethel Sirois PCP.   Total time spent:30 Minutes Time spent includes review of chart, medications, test results, and follow up plan with the patient.   [place] Controlled Substance Database was reviewed by me.  This patient was seen by Sallyanne Kuster, FNP-C in collaboration with Dr. Beverely Risen as a part of collaborative care agreement.   Shaunice Levitan R. Tedd Sias, MSN, FNP-C Internal medicine

## 2022-05-15 ENCOUNTER — Other Ambulatory Visit: Payer: Self-pay | Admitting: Nurse Practitioner

## 2022-05-15 DIAGNOSIS — E1165 Type 2 diabetes mellitus with hyperglycemia: Secondary | ICD-10-CM

## 2022-07-11 ENCOUNTER — Other Ambulatory Visit: Payer: Self-pay | Admitting: Nurse Practitioner

## 2022-07-11 DIAGNOSIS — K219 Gastro-esophageal reflux disease without esophagitis: Secondary | ICD-10-CM

## 2022-08-01 ENCOUNTER — Encounter: Payer: Self-pay | Admitting: Nurse Practitioner

## 2022-08-01 ENCOUNTER — Ambulatory Visit (INDEPENDENT_AMBULATORY_CARE_PROVIDER_SITE_OTHER): Payer: Medicare Other | Admitting: Nurse Practitioner

## 2022-08-01 VITALS — BP 131/79 | HR 88 | Temp 98.4°F | Resp 16 | Ht 68.0 in | Wt 226.6 lb

## 2022-08-01 DIAGNOSIS — I10 Essential (primary) hypertension: Secondary | ICD-10-CM

## 2022-08-01 DIAGNOSIS — Z6835 Body mass index (BMI) 35.0-35.9, adult: Secondary | ICD-10-CM

## 2022-08-01 DIAGNOSIS — E1165 Type 2 diabetes mellitus with hyperglycemia: Secondary | ICD-10-CM | POA: Diagnosis not present

## 2022-08-01 DIAGNOSIS — J432 Centrilobular emphysema: Secondary | ICD-10-CM | POA: Diagnosis not present

## 2022-08-01 DIAGNOSIS — E782 Mixed hyperlipidemia: Secondary | ICD-10-CM

## 2022-08-01 LAB — POCT GLYCOSYLATED HEMOGLOBIN (HGB A1C): Hemoglobin A1C: 7.6 % — AB (ref 4.0–5.6)

## 2022-08-01 NOTE — Progress Notes (Signed)
[facility] [address] [place], [place] [address]  Internal MEDICINE  Office Visit Note  Patient Name: Joe Berry  [id number]  [id number]  Date of Service: 08/01/2022  Chief Complaint  Patient presents with   Follow-up   Diabetes    HPI Joe Berry presents for a follow up visit for diabetes, hypertension, and weight loss management.  --diabetes -- average glucose 150-180. A1c significantly improved to 7.6 today; was 8.3 in June.  -- weight loss management -- 221 lbs on scale at home this morning, down 6 lbs.  Was 227 lbs at last visit in June. --hypertension -- well controlled, followed by cardiology. Takes entresto, spironolactone, KCL, toprol-XL, and furosemide --cholesterol -- on statin therapy: rosuvastatin 10 mg daily.    Current Medication: Outpatient Encounter Medications as of 08/01/2022  Medication Sig   ACCU-CHEK FASTCLIX LANCETS MISC Use to test blood sugars twice daily e11.65   aspirin EC 81 MG tablet Take 1 tablet (81 mg total) by mouth daily.   diphenhydrAMINE HCl 50 MG/30ML LIQD Take 30 mLs by mouth at bedtime as needed (sleep).   Dulaglutide 3 MG/0.5ML SOPN Inject 3 mg into the skin once a week.   furosemide (LASIX) 40 MG tablet Take 1 tablet (40 mg total) by mouth daily.   glimepiride (AMARYL) 2 MG tablet TAKE 1 TABLET DAILY BEFORE BREAKFAST   metFORMIN (GLUCOPHAGE-XR) 750 MG 24 hr tablet Take 2 tablets (1,500 mg total) by mouth daily with breakfast.   metoprolol succinate (TOPROL-XL) 50 MG 24 hr tablet Take 1 tablet (50 mg total) by mouth daily.   omeprazole (PRILOSEC) 20 MG capsule TAKE 1 CAPSULE DAILY   potassium chloride (KLOR-CON) 10 MEQ tablet Take 1 tablet (10 mEq total) by mouth daily.   rosuvastatin (CRESTOR) 10 MG tablet Take 1 tablet (10 mg total) by mouth daily.   sacubitril-valsartan (ENTRESTO) 49-51 MG Take 1 tablet by mouth 2 (two) times daily.   spironolactone (ALDACTONE) 25 MG tablet Take 1 tablet (25 mg total) by mouth daily.   No  facility-administered encounter medications on file as of 08/01/2022.    Surgical History: Past Surgical History:  Procedure Laterality Date   LEFT HEART CATH AND CORONARY ANGIOGRAPHY Left 04/12/2018   Procedure: LEFT HEART CATH AND CORONARY ANGIOGRAPHY;  Surgeon: Antonieta Iba, MD;  Location: [facility] INVASIVE CV LAB;  Service: Cardiovascular;  Laterality: Left;    Medical History: Past Medical History:  Diagnosis Date   Anxiety    Cataract    Chronic HFrEF (heart failure with reduced ejection fraction) (HCC)    a. 02/2018 Echo: EF 25-30%; b. 07/2018 Echo: EF 35-40%; c. 11/2020 Echo: EF 45-50%, diast dysfxn, nl RV fxn. Trace MR/TR.   COPD (chronic obstructive pulmonary disease) (HCC)    Diabetes mellitus without complication (HCC)    NICM (nonischemic cardiomyopathy) (HCC)    a. 02/2018 Echo: EF 25-30%; b. 07/2018 Echo: EF 35-40%; c. 11/2020 Echo: EF 45-50%, diast dysfxn.   Nonobstructive CAD (coronary artery disease)    a. 03/2018 Cath: LM nl, LAD mild diff dzs prox, heavy Ca2+, 60d, LCX nl/large, OM1 50, RCA nl. EF <20%.   Sleep apnea     Family History: Family History  Problem Relation Age of Onset   Heart disease Mother    Lung cancer Father    Heart disease Maternal Grandmother    Heart Problems Maternal Grandmother    Heart Problems Brother    Heart Problems Maternal Grandfather     Social History  Socioeconomic History   Marital status: Single    Spouse name: Not on file   Number of children: Not on file   Years of education: Not on file   Highest education level: Not on file  Occupational History   Not on file  Tobacco Use   Smoking status: Former    Packs/day: 1.50    Years: 40.00    Total pack years: 60.00    Types: Cigarettes   Smokeless tobacco: Never   Tobacco comments:    quit 2008  Vaping Use   Vaping Use: Never used  Substance and Sexual Activity   Alcohol use: Yes    Alcohol/week: 1.0 standard drink of alcohol    Types: 1 Shots of liquor per week     Comment: occasional drink   Drug use: Never   Sexual activity: Not on file  Other Topics Concern   Not on file  Social History Narrative   Not on file   Social Determinants of Health   Financial Resource Strain: Not on file  Food Insecurity: Not on file  Transportation Needs: Not on file  Physical Activity: Not on file  Stress: Not on file  Social Connections: Not on file  Intimate Partner Violence: Not on file      Review of Systems  Constitutional:  Negative for chills, fatigue and unexpected weight change.  HENT:  Negative for congestion, rhinorrhea, sneezing and sore throat.   Eyes:  Negative for redness.  Respiratory: Negative.  Negative for cough, chest tightness, shortness of breath and wheezing.   Cardiovascular: Negative.  Negative for chest pain and palpitations.  Gastrointestinal:  Negative for abdominal pain, constipation, diarrhea, nausea and vomiting.  Genitourinary:  Negative for dysuria and frequency.  Musculoskeletal:  Negative for arthralgias, back pain, joint swelling and neck pain.  Skin:  Negative for rash.  Neurological: Negative.  Negative for tremors and numbness.  Hematological:  Negative for adenopathy. Does not bruise/bleed easily.  Psychiatric/Behavioral:  Negative for behavioral problems (Depression), sleep disturbance and suicidal ideas. The patient is not nervous/anxious.     Vital Signs: BP 131/79   Pulse 88   Temp 98.4 F (36.9 C)   Resp 16   Ht 5\' 8"  (1.727 m)   Wt 226 lb 9.6 oz (102.8 kg)   SpO2 95%   BMI 34.45 kg/m    Physical Exam Vitals reviewed.  Constitutional:      General: He is not in acute distress.    Appearance: Normal appearance. He is obese. He is not ill-appearing.  HENT:     Head: Normocephalic and atraumatic.  Eyes:     Pupils: Pupils are equal, round, and reactive to light.  Cardiovascular:     Rate and Rhythm: Normal rate and regular rhythm.  Pulmonary:     Effort: Pulmonary effort is normal. No  respiratory distress.  Neurological:     Mental Status: He is alert and oriented to person, place, and time.  Psychiatric:        Mood and Affect: Mood normal.        Behavior: Behavior normal.        Assessment/Plan: 1. Uncontrolled type 2 diabetes mellitus with hyperglycemia (HCC) Significant improvement in A1c, 7.6 today. Continue current medications as prescribed, no changes. [place]ollow up in 3 months for repeat A1C - POCT HgB A1C  2. Essential hypertension Stable, continue current medications as prescribed, continue routine follow ups with cardiology as scheduled or instructed  3. Centrilobular emphysema (HCC) Stable,  no maintenance inhaler. Denies any SOB, chest tightness, cough or wheezing.   4. Mixed hyperlipidemia Continue rosuvastatin as prescribed.  5. Class 2 severe obesity due to excess calories with serious comorbidity and body mass index (BMI) of 35.0 to 35.9 in adult [facility]) Keep up the good work. Continue current diet and lifestyle modifications as as discussed   General Counseling: Joe Berry verbalizes understanding of the findings of todays visit and agrees with plan of treatment. I have discussed any further diagnostic evaluation that may be needed or ordered today. We also reviewed his medications today. he has been encouraged to call the office with any questions or concerns that should arise related to todays visit.    Orders Placed This Encounter  Procedures   POCT HgB A1C    No orders of the defined types were placed in this encounter.   Return for previously scheduled, CPE, Garwood Wentzell PCP in december this year. .   Total time spent:30 Minutes Time spent includes review of chart, medications, test results, and follow up plan with the patient.   [place] Controlled Substance Database was reviewed by me.  This patient was seen by Sallyanne Kuster, FNP-C in collaboration with Dr. Beverely Risen as a part of collaborative care agreement.   Serina Nichter R. Tedd Sias, MSN,  FNP-C Internal medicine

## 2022-08-23 ENCOUNTER — Other Ambulatory Visit: Payer: Self-pay

## 2022-08-23 NOTE — Telephone Encounter (Signed)
Refill request sent to [facility] # [phone]  for Entresto 49-51 mg # 180 tablets x 3 refills

## 2022-09-24 ENCOUNTER — Encounter: Payer: Self-pay | Admitting: Nurse Practitioner

## 2022-10-06 ENCOUNTER — Telehealth: Payer: Self-pay

## 2022-10-06 NOTE — Telephone Encounter (Signed)
[employer] renewal was faxed on 10-06-22. 

## 2022-10-09 ENCOUNTER — Encounter: Payer: Self-pay | Admitting: Physician Assistant

## 2022-10-09 ENCOUNTER — Telehealth: Payer: Self-pay

## 2022-10-09 ENCOUNTER — Telehealth (INDEPENDENT_AMBULATORY_CARE_PROVIDER_SITE_OTHER): Payer: Medicare Other | Admitting: Physician Assistant

## 2022-10-09 DIAGNOSIS — J069 Acute upper respiratory infection, unspecified: Secondary | ICD-10-CM

## 2022-10-09 MED ORDER — AZITHROMYCIN 250 MG PO TABS: ORAL_TABLET | ORAL | 0 refills | Status: DC

## 2022-10-09 NOTE — Progress Notes (Signed)
[facility] [address] [place], [place] [address]  Internal MEDICINE  Telephone Visit  Patient Name: Joe Berry  [id number]  [id number]  Date of Service: 10/09/2022  I connected with the patient at 12:03 by telephone and verified the patients identity using two identifiers.   I discussed the limitations, risks, security and privacy concerns of performing an evaluation and management service by telephone and the availability of in person appointments. I also discussed with the patient that there may be a patient responsible charge related to the service.  The patient expressed understanding and agrees to proceed.    Chief Complaint  Patient presents with   Telephone Screen    [phone], prefers either telephone or video   Telephone Assessment   Nasal Congestion    Very congested, hard to get mucus up - light yellow color. Negative for Covid.    HPI Pt is here for virtual sick visit -Congestion in chest and coughing -Started as sore throat over a week ago. That got better but congestion got worse. Coughing at night and it is a productive cough. Once he gets the mucus out he feels better. Some sinus pressure. -started mucinex Friday, did sudafed last week prior to mucinex, but stopped this.  -Admits to wheezing, Denies SOB -Covid negative  Current Medication: Outpatient Encounter Medications as of 10/09/2022  Medication Sig   ACCU-CHEK FASTCLIX LANCETS MISC Use to test blood sugars twice daily e11.65   aspirin EC 81 MG tablet Take 1 tablet (81 mg total) by mouth daily.   azithromycin (ZITHROMAX) 250 MG tablet Take one tab a day for 10 days for uri   diphenhydrAMINE HCl 50 MG/30ML LIQD Take 30 mLs by mouth at bedtime as needed (sleep).   Dulaglutide 3 MG/0.5ML SOPN Inject 3 mg into the skin once a week.   furosemide (LASIX) 40 MG tablet Take 1 tablet (40 mg total) by mouth daily.   glimepiride (AMARYL) 2 MG tablet TAKE 1 TABLET DAILY BEFORE BREAKFAST   metFORMIN  (GLUCOPHAGE-XR) 750 MG 24 hr tablet Take 2 tablets (1,500 mg total) by mouth daily with breakfast.   metoprolol succinate (TOPROL-XL) 50 MG 24 hr tablet Take 1 tablet (50 mg total) by mouth daily.   omeprazole (PRILOSEC) 20 MG capsule TAKE 1 CAPSULE DAILY   potassium chloride (KLOR-CON) 10 MEQ tablet Take 1 tablet (10 mEq total) by mouth daily.   rosuvastatin (CRESTOR) 10 MG tablet Take 1 tablet (10 mg total) by mouth daily.   sacubitril-valsartan (ENTRESTO) 49-51 MG Take 1 tablet by mouth 2 (two) times daily.   spironolactone (ALDACTONE) 25 MG tablet Take 1 tablet (25 mg total) by mouth daily.   No facility-administered encounter medications on file as of 10/09/2022.    Surgical History: Past Surgical History:  Procedure Laterality Date   LEFT HEART CATH AND CORONARY ANGIOGRAPHY Left 04/12/2018   Procedure: LEFT HEART CATH AND CORONARY ANGIOGRAPHY;  Surgeon: Antonieta Iba, MD;  Location: [facility] INVASIVE CV LAB;  Service: Cardiovascular;  Laterality: Left;    Medical History: Past Medical History:  Diagnosis Date   Anxiety    Cataract    Chronic HFrEF (heart failure with reduced ejection fraction) (HCC)    a. 02/2018 Echo: EF 25-30%; b. 07/2018 Echo: EF 35-40%; c. 11/2020 Echo: EF 45-50%, diast dysfxn, nl RV fxn. Trace MR/TR.   COPD (chronic obstructive pulmonary disease) (HCC)    Diabetes mellitus without complication (HCC)    NICM (nonischemic cardiomyopathy) (HCC)    a. 02/2018  Echo: EF 25-30%; b. 07/2018 Echo: EF 35-40%; c. 11/2020 Echo: EF 45-50%, diast dysfxn.   Nonobstructive CAD (coronary artery disease)    a. 03/2018 Cath: LM nl, LAD mild diff dzs prox, heavy Ca2+, 60d, LCX nl/large, OM1 50, RCA nl. EF <20%.   Sleep apnea     Family History: Family History  Problem Relation Age of Onset   Heart disease Mother    Lung cancer Father    Heart disease Maternal Grandmother    Heart Problems Maternal Grandmother    Heart Problems Brother    Heart Problems Maternal Grandfather      Social History   Socioeconomic History   Marital status: Single    Spouse name: Not on file   Number of children: Not on file   Years of education: Not on file   Highest education level: Not on file  Occupational History   Not on file  Tobacco Use   Smoking status: Former    Packs/day: 1.50    Years: 40.00    Total pack years: 60.00    Types: Cigarettes   Smokeless tobacco: Never   Tobacco comments:    quit 2008  Vaping Use   Vaping Use: Never used  Substance and Sexual Activity   Alcohol use: Yes    Alcohol/week: 1.0 standard drink of alcohol    Types: 1 Shots of liquor per week    Comment: occasional drink   Drug use: Never   Sexual activity: Not on file  Other Topics Concern   Not on file  Social History Narrative   Not on file   Social Determinants of Health   Financial Resource Strain: Not on file  Food Insecurity: Not on file  Transportation Needs: Not on file  Physical Activity: Not on file  Stress: Not on file  Social Connections: Not on file  Intimate Partner Violence: Not on file      Review of Systems  Constitutional:  Negative for chills, fatigue and fever.  HENT:  Positive for congestion, postnasal drip and sinus pressure. Negative for mouth sores.   Respiratory:  Positive for cough and wheezing. Negative for shortness of breath.   Cardiovascular:  Negative for chest pain.  Genitourinary:  Negative for flank pain.  Psychiatric/Behavioral: Negative.      Vital Signs: Resp 16   Ht 5\' 8"  (1.727 m)   BMI 34.45 kg/m    Observation/Objective:  Pt is able to carry out conversation   Assessment/Plan: 1. Upper respiratory tract infection, unspecified type Will start on zpak and continue mucinex. May also start on nasal spray and should stay well hydrated and rest. - azithromycin (ZITHROMAX) 250 MG tablet; Take one tab a day for 10 days for uri  Dispense: 10 tablet; Refill: 0   General Counseling: Levert verbalizes understanding of the  findings of today's phone visit and agrees with plan of treatment. I have discussed any further diagnostic evaluation that may be needed or ordered today. We also reviewed his medications today. he has been encouraged to call the office with any questions or concerns that should arise related to todays visit.    No orders of the defined types were placed in this encounter.   Meds ordered this encounter  Medications   azithromycin (ZITHROMAX) 250 MG tablet    Sig: Take one tab a day for 10 days for uri    Dispense:  10 tablet    Refill:  0    Time spent:25 Minutes  Dr Lavera Guise Internal medicine

## 2022-10-09 NOTE — Telephone Encounter (Signed)
Lets get him scheduled for a virtual

## 2022-10-11 ENCOUNTER — Other Ambulatory Visit: Payer: Self-pay | Admitting: Nurse Practitioner

## 2022-10-11 DIAGNOSIS — E1165 Type 2 diabetes mellitus with hyperglycemia: Secondary | ICD-10-CM

## 2022-10-18 ENCOUNTER — Ambulatory Visit: Payer: Medicare Other | Admitting: Nurse Practitioner

## 2022-10-27 ENCOUNTER — Telehealth: Payer: Self-pay | Admitting: *Deleted

## 2022-10-27 NOTE — Telephone Encounter (Signed)
Paper work obtained from nurse bin for Dr. Mariah Milling.  Patient Assistance re-enrollment form for [employer]). Application placed on Dr. Windell Hummingbird desk for review/ signature.

## 2022-10-27 NOTE — Telephone Encounter (Signed)
[employer] patient assistance forms signed by Dr. Mariah Milling and faxed to [phone].  Fax confirmation received.   Paperwork placed in the file cabinet for completed forms.

## 2022-11-02 ENCOUNTER — Encounter: Payer: Self-pay | Admitting: Nurse Practitioner

## 2022-11-02 ENCOUNTER — Ambulatory Visit (INDEPENDENT_AMBULATORY_CARE_PROVIDER_SITE_OTHER): Payer: Medicare Other | Admitting: Nurse Practitioner

## 2022-11-02 VITALS — BP 136/84 | HR 88 | Temp 97.6°F | Resp 16 | Ht 68.0 in | Wt 228.4 lb

## 2022-11-02 DIAGNOSIS — E1165 Type 2 diabetes mellitus with hyperglycemia: Secondary | ICD-10-CM

## 2022-11-02 DIAGNOSIS — Z1212 Encounter for screening for malignant neoplasm of rectum: Secondary | ICD-10-CM

## 2022-11-02 DIAGNOSIS — E559 Vitamin D deficiency, unspecified: Secondary | ICD-10-CM

## 2022-11-02 DIAGNOSIS — Z0001 Encounter for general adult medical examination with abnormal findings: Secondary | ICD-10-CM

## 2022-11-02 DIAGNOSIS — R300 Dysuria: Secondary | ICD-10-CM

## 2022-11-02 DIAGNOSIS — E782 Mixed hyperlipidemia: Secondary | ICD-10-CM

## 2022-11-02 DIAGNOSIS — Z1211 Encounter for screening for malignant neoplasm of colon: Secondary | ICD-10-CM

## 2022-11-02 LAB — POCT GLYCOSYLATED HEMOGLOBIN (HGB A1C): Hemoglobin A1C: 8.3 % — AB (ref 4.0–5.6)

## 2022-11-02 NOTE — Progress Notes (Signed)
[facility] [place], [place] [address]  Internal MEDICINE  Office Visit Note  Patient Name: Joe Berry  [id number]  [id number]  Date of Service: 11/02/2022  Chief Complaint  Patient presents with   Medicare Wellness   Diabetes    HPI Joe Berry presents for an annual well visit and physical exam.  Well-appearing 66 y.o. male/male with diabetes, hypertension, high cholesterol, heart failure and GERD A1c is 8.3 today which is up from 7.6 in September this year.  BP is controlled with current medications  Routine CRC screening:  colonoscopy was done in 2020, due to repeat Eye exam: last done in 2019, does not have vision coverage right now foot exam: done today Labs: due for routine labs now New or worsening pain: none Other concerns: none     11/02/2022   11:13 AM 10/31/2021    8:32 AM  MMSE - Mini Mental State Exam  Orientation to time 5 5  Orientation to Place 5 5  Registration 3 3  Attention/ Calculation 5 5  Recall 3 3  Language- name 2 objects 2 2  Language- repeat 1 1  Language- follow 3 step command 3 3  Language- read & follow direction 1 1  Write a sentence 1 1  Copy design 1 1  Total score 30 30    Functional Status Survey: Is the patient deaf or have difficulty hearing?: No Does the patient have difficulty seeing, even when wearing glasses/contacts?: No Does the patient have difficulty concentrating, remembering, or making decisions?: No Does the patient have difficulty walking or climbing stairs?: No Does the patient have difficulty dressing or bathing?: No Does the patient have difficulty doing errands alone such as visiting a doctor's office or shopping?: No     10/31/2021    8:32 AM 01/30/2022    8:24 AM 05/02/2022    8:40 AM 08/01/2022    8:40 AM 11/02/2022   11:11 AM  Fall Risk  Falls in the past year? 0 0 0 0 0  Was there an injury with Fall?    0 0  Fall Risk Category Calculator    0 0  Fall Risk Category    Low  Low  Patient Fall Risk Level Low fall risk Low fall risk  Low fall risk Low fall risk  Patient at Risk for Falls Due to No Fall Risks No Fall Risks  No Fall Risks No Fall Risks  Fall risk Follow up Falls evaluation completed Falls evaluation completed  Falls evaluation completed Falls evaluation completed       11/02/2022   11:11 AM  Depression screen PHQ 2/9  Decreased Interest 0  Down, Depressed, Hopeless 0  PHQ - 2 Score 0       Current Medication: Outpatient Encounter Medications as of 11/02/2022  Medication Sig   ACCU-CHEK FASTCLIX LANCETS MISC Use to test blood sugars twice daily e11.65   aspirin EC 81 MG tablet Take 1 tablet (81 mg total) by mouth daily.   diphenhydrAMINE HCl 50 MG/30ML LIQD Take 30 mLs by mouth at bedtime as needed (sleep).   Dulaglutide 3 MG/0.5ML SOPN Inject 3 mg into the skin once a week.   furosemide (LASIX) 40 MG tablet Take 1 tablet (40 mg total) by mouth daily.   glimepiride (AMARYL) 2 MG tablet TAKE 1 TABLET DAILY BEFORE BREAKFAST   metFORMIN (GLUCOPHAGE-XR) 750 MG 24 hr tablet TAKE 2 TABLETS DAILY WITH BREAKFAST (DISCONTINUE ALL PREVIOUS PRESCRIPTIONS FOR METFORMIN)   metoprolol  succinate (TOPROL-XL) 50 MG 24 hr tablet Take 1 tablet (50 mg total) by mouth daily.   omeprazole (PRILOSEC) 20 MG capsule TAKE 1 CAPSULE DAILY   potassium chloride (KLOR-CON) 10 MEQ tablet Take 1 tablet (10 mEq total) by mouth daily.   rosuvastatin (CRESTOR) 10 MG tablet Take 1 tablet (10 mg total) by mouth daily.   sacubitril-valsartan (ENTRESTO) 49-51 MG Take 1 tablet by mouth 2 (two) times daily.   spironolactone (ALDACTONE) 25 MG tablet Take 1 tablet (25 mg total) by mouth daily.   [DISCONTINUED] azithromycin (ZITHROMAX) 250 MG tablet Take one tab a day for 10 days for uri   No facility-administered encounter medications on file as of 11/02/2022.    Surgical History: Past Surgical History:  Procedure Laterality Date   LEFT HEART CATH AND CORONARY ANGIOGRAPHY  Left 04/12/2018   Procedure: LEFT HEART CATH AND CORONARY ANGIOGRAPHY;  Surgeon: Minna Merritts, MD;  Location: [facility];  Service: Cardiovascular;  Laterality: Left;    Medical History: Past Medical History:  Diagnosis Date   Anxiety    Cataract    Chronic HFrEF (heart failure with reduced ejection fraction) ([place])    a. 02/2018 Echo: EF 25-30%; b. 07/2018 Echo: EF 35-40%; c. 11/2020 Echo: EF 45-50%, diast dysfxn, nl RV fxn. Trace MR/TR.   COPD (chronic obstructive pulmonary disease) (HCC)    Diabetes mellitus without complication (HCC)    NICM (nonischemic cardiomyopathy) ([place])    a. 02/2018 Echo: EF 25-30%; b. 07/2018 Echo: EF 35-40%; c. 11/2020 Echo: EF 45-50%, diast dysfxn.   Nonobstructive CAD (coronary artery disease)    a. 03/2018 Cath: LM nl, LAD mild diff dzs prox, heavy Ca2+, 60d, LCX nl/large, OM1 50, RCA nl. EF <20%.   Sleep apnea     Family History: Family History  Problem Relation Age of Onset   Heart disease Mother    Lung cancer Father    Heart disease Maternal Grandmother    Heart Problems Maternal Grandmother    Heart Problems Brother    Heart Problems Maternal Grandfather     Social History   Socioeconomic History   Marital status: Single    Spouse name: Not on file   Number of children: Not on file   Years of education: Not on file   Highest education level: Not on file  Occupational History   Not on file  Tobacco Use   Smoking status: Former    Packs/day: 1.50    Years: 40.00    Total pack years: 60.00    Types: Cigarettes   Smokeless tobacco: Never   Tobacco comments:    quit 2008  Vaping Use   Vaping Use: Never used  Substance and Sexual Activity   Alcohol use: Yes    Alcohol/week: 1.0 standard drink of alcohol    Types: 1 Shots of liquor per week    Comment: occasional drink   Drug use: Never   Sexual activity: Not on file  Other Topics Concern   Not on file  Social History Narrative   Not on file   Social Determinants of  Health   Financial Resource Strain: Not on file  Food Insecurity: Not on file  Transportation Needs: Not on file  Physical Activity: Not on file  Stress: Not on file  Social Connections: Not on file  Intimate Partner Violence: Not on file      Review of Systems  Constitutional:  Negative for activity change, appetite change, chills, fatigue, fever and  unexpected weight change.  HENT: Negative.  Negative for congestion, ear pain, rhinorrhea, sore throat and trouble swallowing.   Eyes: Negative.   Respiratory: Negative.  Negative for cough, chest tightness, shortness of breath and wheezing.   Cardiovascular: Negative.  Negative for chest pain.  Gastrointestinal: Negative.  Negative for abdominal pain, blood in stool, constipation, diarrhea, nausea and vomiting.  Endocrine: Negative.   Genitourinary: Negative.  Negative for difficulty urinating, dysuria, frequency, hematuria and urgency.  Musculoskeletal: Negative.  Negative for arthralgias, back pain, joint swelling, myalgias and neck pain.  Skin: Negative.  Negative for rash and wound.  Allergic/Immunologic: Negative.  Negative for immunocompromised state.  Neurological: Negative.  Negative for dizziness, seizures, numbness and headaches.  Hematological: Negative.   Psychiatric/Behavioral: Negative.  Negative for behavioral problems, self-injury and suicidal ideas. The patient is not nervous/anxious.     Vital Signs: BP 136/84   Pulse 88   Temp 97.6 F (36.4 C)   Resp 16   Ht _0  (1.727 m)   Wt 228 lb 6.4 oz (103.6 kg)   SpO2 95%   BMI 34.73 kg/m    Physical Exam Vitals reviewed.  Constitutional:      General: He is awake. He is not in acute distress.    Appearance: Normal appearance. He is well-developed and well-groomed. He is obese. He is not ill-appearing or diaphoretic.  HENT:     Head: Normocephalic and atraumatic.     Right Ear: Tympanic membrane, ear canal and external ear normal.     Left Ear: Tympanic  membrane, ear canal and external ear normal.     Nose: Nose normal. No congestion or rhinorrhea.     Mouth/Throat:     Lips: Pink.     Mouth: Mucous membranes are moist.     Pharynx: Oropharynx is clear. Uvula midline. No oropharyngeal exudate or posterior oropharyngeal erythema.  Eyes:     General: Lids are normal. Vision grossly intact. Gaze aligned appropriately. No scleral icterus.       Right eye: No discharge.        Left eye: No discharge.     Extraocular Movements: Extraocular movements intact.     Conjunctiva/sclera: Conjunctivae normal.     Pupils: Pupils are equal, round, and reactive to light.     Funduscopic exam:    Right eye: Red reflex present.        Left eye: Red reflex present. Neck:     Thyroid: No thyromegaly.     Vascular: No JVD.     Trachea: Trachea and phonation normal. No tracheal deviation.  Cardiovascular:     Rate and Rhythm: Normal rate and regular rhythm.     Pulses: Normal pulses.          Dorsalis pedis pulses are 2+ on the right side and 2+ on the left side.       Posterior tibial pulses are 2+ on the right side and 2+ on the left side.     Heart sounds: Normal heart sounds, S1 normal and S2 normal. No murmur heard.    No friction rub. No gallop.  Pulmonary:     Effort: Pulmonary effort is normal. No accessory muscle usage or respiratory distress.     Breath sounds: Normal breath sounds and air entry. No stridor. No wheezing or rales.  Chest:     Chest wall: No tenderness.  Abdominal:     General: Bowel sounds are normal. There is no distension.  Palpations: Abdomen is soft. There is no shifting dullness, fluid wave, mass or pulsatile mass.     Tenderness: There is no abdominal tenderness. There is no guarding or rebound.  Musculoskeletal:        General: No tenderness or deformity. Normal range of motion.     Cervical back: Normal range of motion and neck supple.     Right foot: Normal range of motion. No deformity, bunion, Charcot foot,  foot drop or prominent metatarsal heads.     Left foot: Normal range of motion. No deformity, bunion, Charcot foot, foot drop or prominent metatarsal heads.  Feet:     Right foot:     Protective Sensation: 6 sites tested.  6 sites sensed.     Skin integrity: Callus and dry skin present. No ulcer, blister, skin breakdown, erythema, warmth or fissure.     Toenail Condition: Right toenails are abnormally thick and long.     Left foot:     Protective Sensation: 6 sites tested.  6 sites sensed.     Skin integrity: Callus and dry skin present. No ulcer, blister, skin breakdown, erythema, warmth or fissure.     Toenail Condition: Left toenails are abnormally thick and long.  Lymphadenopathy:     Cervical: No cervical adenopathy.  Skin:    General: Skin is warm and dry.     Capillary Refill: Capillary refill takes less than 2 seconds.     Coloration: Skin is not pale.     Findings: No erythema or rash.  Neurological:     Mental Status: He is alert and oriented to person, place, and time.     Cranial Nerves: No cranial nerve deficit.     Motor: No abnormal muscle tone.     Coordination: Coordination normal.     Gait: Gait normal.     Deep Tendon Reflexes: Reflexes are normal and symmetric.  Psychiatric:        Mood and Affect: Mood normal.        Behavior: Behavior normal. Behavior is cooperative.        Thought Content: Thought content normal.        Judgment: Judgment normal.        Assessment/Plan: 1. Encounter for routine adult health examination with abnormal findings Age-appropriate preventive screenings and vaccinations discussed, annual physical exam completed. Routine labs for health maintenance ordered, see below. PHM updated.  - CBC with Differential/Platelet - CMP14+EGFR - Cologuard - Lipid Profile - Vitamin D (25 hydroxy)  2. Uncontrolled type 2 diabetes mellitus with hyperglycemia (HCC) A1c is elevated at 8.3, has been lax on his diet and exercise and plans to work  on this. Will repeat A1c in 3 months - POCT glycosylated hemoglobin (Hb A1C) - Urine Microalbumin w/creat. ratio  3. Mixed hyperlipidemia Routine labs ordered - CBC with Differential/Platelet - CMP14+EGFR - Lipid Profile  4. Vitamin D deficiency Routine lab ordered - Vitamin D (25 hydroxy)  5. Screening for colorectal cancer Cologuard ordered - Cologuard  6. Dysuria Routine urinalysis done  - UA/M w/rflx Culture, Routine      General Counseling: Gustavo verbalizes understanding of the findings of todays visit and agrees with plan of treatment. I have discussed any further diagnostic evaluation that may be needed or ordered today. We also reviewed his medications today. he has been encouraged to call the office with any questions or concerns that should arise related to todays visit.    Orders Placed This Encounter  Procedures  CBC with Differential/Platelet   CMP14+EGFR   Cologuard   Lipid Profile   Vitamin D (25 hydroxy)   UA/M w/rflx Culture, Routine   Urine Microalbumin w/creat. ratio   POCT glycosylated hemoglobin (Hb A1C)    No orders of the defined types were placed in this encounter.   Return in about 3 months (around 02/07/2023).   Total time spent:30 Minutes Time spent includes review of chart, medications, test results, and follow up plan with the patient.   [place] Controlled Substance Database was reviewed by me.  This patient was seen by Jonetta Osgood, FNP-C in collaboration with Dr. Clayborn Bigness as a part of collaborative care agreement.  Abbrielle Batts R. Valetta Fuller, MSN, FNP-C Internal medicine

## 2022-11-03 LAB — MICROSCOPIC EXAMINATION
Bacteria, UA: NONE SEEN
Casts: NONE SEEN /lpf
Epithelial Cells (non renal): NONE SEEN /hpf (ref 0–10)
RBC, Urine: NONE SEEN /hpf (ref 0–2)
WBC, UA: NONE SEEN /hpf (ref 0–5)

## 2022-11-03 LAB — UA/M W/RFLX CULTURE, ROUTINE
Bilirubin, UA: NEGATIVE
Glucose, UA: NEGATIVE
Ketones, UA: NEGATIVE
Leukocytes,UA: NEGATIVE
Nitrite, UA: NEGATIVE
Protein,UA: NEGATIVE
RBC, UA: NEGATIVE
Specific Gravity, UA: 1.01 (ref 1.005–1.030)
Urobilinogen, Ur: 0.2 mg/dL (ref 0.2–1.0)
pH, UA: 5.5 (ref 5.0–7.5)

## 2022-11-03 LAB — MICROALBUMIN / CREATININE URINE RATIO
Creatinine, Urine: 30.5 mg/dL
Microalb/Creat Ratio: 96 mg/g creat — ABNORMAL HIGH (ref 0–29)
Microalbumin, Urine: 29.2 ug/mL

## 2022-11-15 LAB — CMP14+EGFR
ALT: 28 IU/L (ref 0–44)
AST: 22 IU/L (ref 0–40)
Albumin/Globulin Ratio: 1.4 (ref 1.2–2.2)
Albumin: 4.5 g/dL (ref 3.9–4.9)
Alkaline Phosphatase: 37 IU/L — ABNORMAL LOW (ref 44–121)
BUN/Creatinine Ratio: 26 — ABNORMAL HIGH (ref 10–24)
BUN: 24 mg/dL (ref 8–27)
Bilirubin Total: 0.5 mg/dL (ref 0.0–1.2)
CO2: 26 mmol/L (ref 20–29)
Calcium: 10.3 mg/dL — ABNORMAL HIGH (ref 8.6–10.2)
Chloride: 95 mmol/L — ABNORMAL LOW (ref 96–106)
Creatinine, Ser: 0.92 mg/dL (ref 0.76–1.27)
Globulin, Total: 3.2 g/dL (ref 1.5–4.5)
Glucose: 204 mg/dL — ABNORMAL HIGH (ref 70–99)
Potassium: 5.1 mmol/L (ref 3.5–5.2)
Sodium: 138 mmol/L (ref 134–144)
Total Protein: 7.7 g/dL (ref 6.0–8.5)
eGFR: 91 mL/min/{1.73_m2} (ref >59)

## 2022-11-15 LAB — CBC WITH DIFFERENTIAL/PLATELET
Basophils Absolute: 0.1 10*3/uL (ref 0.0–0.2)
Basos: 1 %
EOS (ABSOLUTE): 0.2 10*3/uL (ref 0.0–0.4)
Eos: 2 %
Hematocrit: 46.3 % (ref 37.5–51.0)
Hemoglobin: 14.8 g/dL (ref 13.0–17.7)
Immature Grans (Abs): 0 10*3/uL (ref 0.0–0.1)
Immature Granulocytes: 0 %
Lymphocytes Absolute: 1.3 10*3/uL (ref 0.7–3.1)
Lymphs: 12 %
MCH: 28.4 pg (ref 26.6–33.0)
MCHC: 32 g/dL (ref 31.5–35.7)
MCV: 89 fL (ref 79–97)
Monocytes Absolute: 0.8 10*3/uL (ref 0.1–0.9)
Monocytes: 7 %
Neutrophils Absolute: 8.5 10*3/uL — ABNORMAL HIGH (ref 1.4–7.0)
Neutrophils: 78 %
Platelets: 271 10*3/uL (ref 150–450)
RBC: 5.21 x10E6/uL (ref 4.14–5.80)
RDW: 13.6 % (ref 11.6–15.4)
WBC: 10.9 10*3/uL — ABNORMAL HIGH (ref 3.4–10.8)

## 2022-11-15 LAB — VITAMIN D 25 HYDROXY (VIT D DEFICIENCY, FRACTURES): Vit D, 25-Hydroxy: 33.1 ng/mL (ref 30.0–100.0)

## 2022-11-15 LAB — LIPID PANEL
Chol/HDL Ratio: 2.8 ratio (ref 0.0–5.0)
Cholesterol, Total: 101 mg/dL (ref 100–199)
HDL: 36 mg/dL — ABNORMAL LOW (ref >39)
LDL Chol Calc (NIH): 42 mg/dL (ref 0–99)
Triglycerides: 127 mg/dL (ref 0–149)
VLDL Cholesterol Cal: 23 mg/dL (ref 5–40)

## 2022-11-23 ENCOUNTER — Other Ambulatory Visit: Payer: Self-pay | Admitting: Cardiovascular Disease

## 2022-11-29 ENCOUNTER — Other Ambulatory Visit: Payer: Self-pay

## 2022-12-07 LAB — COLOGUARD: COLOGUARD: NEGATIVE

## 2022-12-08 ENCOUNTER — Other Ambulatory Visit: Payer: Self-pay

## 2022-12-16 NOTE — Progress Notes (Unsigned)
Cardiology Office Note  Date:  12/18/2022   ID:  Joe Berry, DOB July 12, 1956, MRN [id number]  PCP:  Sallyanne Kuster, NP   Chief Complaint  Patient presents with   12 month follow up     Patient c/o shortness of breath with walking up an incline. Medications reviewed by the patient verbally.     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 coming up the hill, steep incline No regular exercise program  Previously reported he was into metal detection, has not been doing much recently  Lab work reviewed Total cholesterol 101 LDL 42 Glucose 204, creatinine 0.92 BUN 24 Hemoglobin 14.8 A1c 8.3  No leg swelling, no PND orthopnea History of sleep apnea, tolerating CPAP Taking Lasix 40 daily with potassium  EKG personally reviewed by myself on todays visit Shows normal sinus rhythm rate 85 bpm no significant ST-T wave changes  Other past medical history reviewed right Heart cath (very difficult procedure as unable to measure pressures in the PA and wedge) RA pressures: 25 mm Hg RV pressures: 77/17/mean of 25 LVEDP: 32 mm Hg  CT scan images showing significant coronary calcification all 3 vessels  Echocardiogram  severely depressed LV function moderately dilated LV ejection fraction 25-30% dated 03/19/2018   history of sleep apnea and he is non compliant with CPAP therapy.     PMH:   has a past medical history of Anxiety, Cataract, Chronic HFrEF (heart failure with reduced ejection fraction) (HCC),  COPD (chronic obstructive pulmonary disease) (HCC), Diabetes mellitus without complication (HCC), NICM (nonischemic cardiomyopathy) (HCC), Nonobstructive CAD (coronary artery disease), and Sleep apnea.  PSH:    Past Surgical History:  Procedure Laterality Date   LEFT HEART CATH AND CORONARY ANGIOGRAPHY Left 04/12/2018   Procedure: LEFT HEART CATH AND CORONARY ANGIOGRAPHY;  Surgeon: Antonieta Iba, MD;  Location: [facility];  Service: Cardiovascular;  Laterality: Left;    Current Outpatient Medications  Medication Sig Dispense Refill   ACCU-CHEK FASTCLIX LANCETS MISC Use to test blood sugars twice daily e11.65 102 each 11   aspirin EC 81 MG tablet Take 1 tablet (81 mg total) by mouth daily. 90 tablet 3   diphenhydrAMINE HCl 50 MG/30ML LIQD Take 30 mLs by mouth at bedtime as needed (sleep).     Dulaglutide 3 MG/0.5ML SOPN Inject 3 mg into the skin once a week. 6 mL 2   furosemide (LASIX) 40 MG tablet TAKE 1 TABLET DAILY 90 tablet 0   glimepiride (AMARYL) 2 MG tablet TAKE 1 TABLET DAILY BEFORE BREAKFAST 90 tablet 3   metFORMIN (GLUCOPHAGE-XR) 750 MG 24 hr tablet TAKE 2 TABLETS DAILY WITH BREAKFAST (DISCONTINUE ALL PREVIOUS PRESCRIPTIONS FOR METFORMIN) 180 tablet 3   metoprolol succinate (TOPROL-XL) 50 MG 24 hr tablet TAKE 1 TABLET DAILY 90 tablet 0   omeprazole (PRILOSEC) 20 MG capsule TAKE 1 CAPSULE DAILY 90 capsule 3   potassium chloride (KLOR-CON) 10 MEQ tablet TAKE 1 TABLET DAILY 90 tablet 0   rosuvastatin (CRESTOR) 10 MG tablet TAKE 1 TABLET DAILY  90 tablet 0   sacubitril-valsartan (ENTRESTO) 49-51 MG Take 1 tablet by mouth 2 (two) times daily. 180 tablet 3   spironolactone (ALDACTONE) 25 MG tablet TAKE 1 TABLET DAILY 90 tablet 0   No current facility-administered medications for this visit.    Allergies:   Patient has no known allergies.   Social History:  The patient  reports that he has quit smoking. His smoking use included cigarettes. He has a 60.00 pack-year smoking  history. He has never used smokeless tobacco. He reports current alcohol use of about 1.0 standard drink of alcohol per week. He reports that he does not use drugs.   Family History:   family history includes Heart Problems in his brother, maternal grandfather, and maternal grandmother; Heart disease in his maternal grandmother and mother; Lung cancer in his father.    Review of Systems  Constitutional: Negative.   HENT: Negative.    Respiratory: Negative.    Cardiovascular: Negative.   Gastrointestinal: Negative.   Musculoskeletal: Negative.   Neurological: Negative.   Psychiatric/Behavioral: Negative.    All other systems reviewed and are negative.   PHYSICAL EXAM: VS:  BP 110/64 (BP Location: Left Arm, Patient Position: Sitting, Cuff Size: Normal)   Pulse 85   Ht 5\' 8"  (1.727 m)   Wt 234 lb 6 oz (106.3 kg)   SpO2 98%   BMI 35.64 kg/m  , BMI Body mass index is 35.64 kg/m. Constitutional:  oriented to person, place, and time. No distress.  HENT:  Head: Grossly normal Eyes:  no discharge. No scleral icterus.  Neck: No JVD, no carotid bruits  Cardiovascular: Regular rate and rhythm, no murmurs appreciated Pulmonary/Chest: Clear to auscultation bilaterally, no wheezes or rails Abdominal: Soft.  no distension.  no tenderness.  Musculoskeletal: Normal range of motion Neurological:  normal muscle tone. Coordination normal. No atrophy Skin: Skin warm and dry Psychiatric: normal affect, pleasant  Recent Labs: 11/14/2022: ALT 28; BUN 24; Creatinine, Ser 0.92; Hemoglobin 14.8; Platelets 271; Potassium 5.1; Sodium 138    Lipid Panel Lab Results  Component Value Date   CHOL 101 11/14/2022   HDL 36 (L) 11/14/2022   LDLCALC 42 11/14/2022   TRIG 127 11/14/2022      Wt Readings from Last 3 Encounters:  12/18/22 234 lb 6 oz (106.3 kg)  11/02/22 228 lb 6.4 oz (103.6 kg)  08/01/22 226 lb 9.6 oz (102.8 kg)     ASSESSMENT AND PLAN:  Dilated cardiomyopathy (HCC) -  Continue  Entresto, metoprolol, Lasix daily with spironolactone Will defer addition of Jardiance/Farxiga to primary care who is managing his diabetes   Hyperlipidemia Cholesterol is at goal on the current lipid regimen. No changes to the medications were made.  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   Total encounter time more than 30 minutes  Greater than 50% was spent in counseling and coordination of care with the patient   Orders Placed This Encounter  Procedures  EKG 12-Lead     Signed, Esmond Plants, M.D., Ph.D. 12/18/2022  [place], [place]

## 2022-12-18 ENCOUNTER — Encounter: Payer: Self-pay | Admitting: Cardiovascular Disease

## 2022-12-18 ENCOUNTER — Ambulatory Visit: Payer: Medicare Other | Attending: Cardiovascular Disease | Admitting: Cardiovascular Disease

## 2022-12-18 VITALS — BP 110/64 | HR 85 | Ht 68.0 in | Wt 234.4 lb

## 2022-12-18 DIAGNOSIS — E119 Type 2 diabetes mellitus without complications: Secondary | ICD-10-CM | POA: Insufficient documentation

## 2022-12-18 DIAGNOSIS — I2510 Atherosclerotic heart disease of native coronary artery without angina pectoris: Secondary | ICD-10-CM | POA: Insufficient documentation

## 2022-12-18 DIAGNOSIS — I5022 Chronic systolic (congestive) heart failure: Secondary | ICD-10-CM | POA: Insufficient documentation

## 2022-12-18 DIAGNOSIS — J869 Pyothorax without fistula: Secondary | ICD-10-CM

## 2022-12-18 DIAGNOSIS — I272 Pulmonary hypertension, unspecified: Secondary | ICD-10-CM | POA: Diagnosis not present

## 2022-12-18 DIAGNOSIS — Z794 Long term (current) use of insulin: Secondary | ICD-10-CM | POA: Insufficient documentation

## 2022-12-18 DIAGNOSIS — G4733 Obstructive sleep apnea (adult) (pediatric): Secondary | ICD-10-CM | POA: Diagnosis present

## 2022-12-18 DIAGNOSIS — I200 Unstable angina: Secondary | ICD-10-CM | POA: Diagnosis not present

## 2022-12-18 DIAGNOSIS — I428 Other cardiomyopathies: Secondary | ICD-10-CM

## 2022-12-18 NOTE — Patient Instructions (Signed)
Medication Instructions:  No changes  If you need a refill on your cardiac medications before your next appointment, please call your pharmacy.   Lab work: No new labs needed  Testing/Procedures: No new testing needed  Follow-Up: At [facility], you and your health needs are our priority.  As part of our continuing mission to provide you with exceptional heart care, we have created designated Provider Care Teams.  These Care Teams include your primary Cardiologist (physician) and Advanced Practice Providers (APPs -  Physician Assistants and Nurse Practitioners) who all work together to provide you with the care you need, when you need it.  You will need a follow up appointment in 12 months  Providers on your designated Care Team:   Christopher Berge, NP Ryan Dunn, PA-C Cadence Furth, PA-C  COVID-19 Vaccine Information can be found at: [email or web address] For questions related to vaccine distribution or appointments, please email [email or web address] or call [phone].   

## 2022-12-26 NOTE — Progress Notes (Signed)
Let patient know results  --cologuard was negative, repeat in 3 years.  --cholesterol slightly improved.  --there were a few minor abnormal values on the rest of the labs, nothing concerning at this time. We can discuss those at his next office visit or he can schedule a follow up appointment earlier to discuss them if he desires.

## 2022-12-27 ENCOUNTER — Other Ambulatory Visit: Payer: Self-pay

## 2022-12-27 ENCOUNTER — Telehealth: Payer: Self-pay

## 2022-12-27 DIAGNOSIS — K219 Gastro-esophageal reflux disease without esophagitis: Secondary | ICD-10-CM

## 2022-12-27 DIAGNOSIS — E1165 Type 2 diabetes mellitus with hyperglycemia: Secondary | ICD-10-CM

## 2022-12-27 MED ORDER — METFORMIN HCL ER 750 MG PO TB24: ORAL_TABLET | ORAL | 3 refills | Status: DC

## 2022-12-27 MED ORDER — OMEPRAZOLE 20 MG PO CPDR: 20.0000 mg | DELAYED_RELEASE_CAPSULE | Freq: Every day | ORAL | 3 refills | Status: DC

## 2022-12-27 MED ORDER — GLIMEPIRIDE 2 MG PO TABS: 2.0000 mg | ORAL_TABLET | Freq: Every day | ORAL | 3 refills | Status: DC

## 2022-12-27 NOTE — Telephone Encounter (Signed)
Patient notified

## 2022-12-27 NOTE — Telephone Encounter (Signed)
-----   Message from Jonetta Osgood, NP sent at 12/26/2022 10:32 PM EST ----- Let patient know results  --cologuard was negative, repeat in 3 years.  --cholesterol slightly improved.  --there were a few minor abnormal values on the rest of the labs, nothing concerning at this time. We can discuss those at his next office visit or he can schedule a follow up appointment earlier to discuss them if he desires.

## 2023-01-02 ENCOUNTER — Telehealth: Payer: Self-pay | Admitting: Cardiovascular Disease

## 2023-01-02 ENCOUNTER — Other Ambulatory Visit: Payer: Self-pay

## 2023-01-02 DIAGNOSIS — E1165 Type 2 diabetes mellitus with hyperglycemia: Secondary | ICD-10-CM

## 2023-01-02 MED ORDER — FUROSEMIDE 40 MG PO TABS: 40.0000 mg | ORAL_TABLET | Freq: Every day | ORAL | 2 refills | Status: DC

## 2023-01-02 MED ORDER — METOPROLOL SUCCINATE ER 50 MG PO TB24: 50.0000 mg | ORAL_TABLET | Freq: Every day | ORAL | 2 refills | Status: DC

## 2023-01-02 MED ORDER — SPIRONOLACTONE 25 MG PO TABS: 25.0000 mg | ORAL_TABLET | Freq: Every day | ORAL | 2 refills | Status: DC

## 2023-01-02 MED ORDER — POTASSIUM CHLORIDE ER 10 MEQ PO TBCR: 10.0000 meq | EXTENDED_RELEASE_TABLET | Freq: Every day | ORAL | 2 refills | Status: DC

## 2023-01-02 MED ORDER — ROSUVASTATIN CALCIUM 10 MG PO TABS: 10.0000 mg | ORAL_TABLET | Freq: Every day | ORAL | 2 refills | Status: DC

## 2023-01-02 MED ORDER — ENTRESTO 49-51 MG PO TABS: 1.0000 | ORAL_TABLET | Freq: Two times a day (BID) | ORAL | 3 refills | Status: DC

## 2023-01-02 NOTE — Telephone Encounter (Addendum)
Nurse informed pt we will have Dr. Rockey Situ sign prescription and fax to [facility]

## 2023-01-02 NOTE — Telephone Encounter (Signed)
Pt c/o medication issue:  1. Name of Medication:  sacubitril-valsartan (ENTRESTO) 49-51 MG  2. How are you currently taking this medication (dosage and times per day)?   3. Are you having a reaction (difficulty breathing--STAT)?   4. What is your medication issue?   Patient states his medication has already been approved through [employer] but they do not have a prescription for him.

## 2023-01-02 NOTE — Telephone Encounter (Signed)
*  STAT* If patient is at the pharmacy, call can be transferred to refill team.   1. Which medications need to be refilled? (please list name of each medication and dose if known)  furosemide (LASIX) 40 MG tablet potassium chloride (KLOR-CON) 10 MEQ tablet spironolactone (ALDACTONE) 25 MG tablet rosuvastatin (CRESTOR) 10 MG tablet metoprolol succinate (TOPROL-XL) 50 MG 24 hr tablet  2. Which pharmacy/location (including street and city if local pharmacy) is medication to be sent to? [facility] [place] [place] [facility]  3. Do they need a 30 day or 90 day supply?   90 day supply   Patient is requesting to have his prescriptions transferred to [place].

## 2023-01-03 NOTE — Telephone Encounter (Signed)
Prescription faxed to [facility]

## 2023-01-08 MED ORDER — METOPROLOL SUCCINATE ER 50 MG PO TB24: 50.0000 mg | ORAL_TABLET | Freq: Every day | ORAL | 3 refills | Status: DC

## 2023-01-08 MED ORDER — SPIRONOLACTONE 25 MG PO TABS: 25.0000 mg | ORAL_TABLET | Freq: Every day | ORAL | 3 refills | Status: DC

## 2023-01-08 MED ORDER — GLIMEPIRIDE 2 MG PO TABS: 2.0000 mg | ORAL_TABLET | Freq: Every day | ORAL | 3 refills | Status: DC

## 2023-01-08 MED ORDER — POTASSIUM CHLORIDE ER 10 MEQ PO TBCR: 10.0000 meq | EXTENDED_RELEASE_TABLET | Freq: Every day | ORAL | 3 refills | Status: DC

## 2023-01-08 MED ORDER — ROSUVASTATIN CALCIUM 10 MG PO TABS: 10.0000 mg | ORAL_TABLET | Freq: Every day | ORAL | 3 refills | Status: DC

## 2023-01-08 MED ORDER — FUROSEMIDE 40 MG PO TABS: 40.0000 mg | ORAL_TABLET | Freq: Every day | ORAL | 3 refills | Status: DC

## 2023-01-08 NOTE — Telephone Encounter (Signed)
Pt came to the office, states that he is out of lasix and that according to mychart, pt is not due for refill until 02/22/2023. Please call to discuss.

## 2023-01-08 NOTE — Telephone Encounter (Signed)
Left a message for the patient to call back. It looks like his refills were sent in as future.

## 2023-01-08 NOTE — Telephone Encounter (Signed)
Called p/t about refill request. P/t states last refills from [facility] was Oct 2023. Insurance changed 2024 requiring pharmacy change. Never received mediations in January 2024.  Patient states has five days of Lasix remaining of with refill going to mail order refill. Refill corrections send to [facility] to be refilled. Joe Berry

## 2023-02-08 ENCOUNTER — Encounter: Payer: Self-pay | Admitting: Nurse Practitioner

## 2023-02-08 ENCOUNTER — Ambulatory Visit (INDEPENDENT_AMBULATORY_CARE_PROVIDER_SITE_OTHER): Payer: Medicare Other | Admitting: Nurse Practitioner

## 2023-02-08 VITALS — BP 144/66 | HR 90 | Temp 97.9°F | Resp 16 | Ht 68.0 in | Wt 234.8 lb

## 2023-02-08 DIAGNOSIS — J302 Other seasonal allergic rhinitis: Secondary | ICD-10-CM

## 2023-02-08 DIAGNOSIS — E1165 Type 2 diabetes mellitus with hyperglycemia: Secondary | ICD-10-CM | POA: Diagnosis not present

## 2023-02-08 DIAGNOSIS — I10 Essential (primary) hypertension: Secondary | ICD-10-CM

## 2023-02-08 DIAGNOSIS — R5383 Other fatigue: Secondary | ICD-10-CM | POA: Diagnosis not present

## 2023-02-08 LAB — POCT GLYCOSYLATED HEMOGLOBIN (HGB A1C): Hemoglobin A1C: 9.4 % — AB (ref 4.0–5.6)

## 2023-02-08 NOTE — Progress Notes (Signed)
[facility] [place], [place] [address]  Internal MEDICINE  Office Visit Note  Patient Name: Joe Berry  [id number]  [id number]  Date of Service: 02/08/2023  Chief Complaint  Patient presents with   Diabetes   Follow-up    HPI Joe Berry presents for a follow-up visit for diabetes, hypertension, heart disease, sinus drainage and fatigue.  Diabetes -- A1c significantly elevated at 9.4. reports he has been eating badly and not watching his diet, going to work on this BP -- stable, on current medications  Sinus drainage in throat started taking allergy pill.  Fatigue -- monitoring, if it bothers him enough, he will call the clinic for labs.     Current Medication: Outpatient Encounter Medications as of 02/08/2023  Medication Sig   ACCU-CHEK FASTCLIX LANCETS MISC Use to test blood sugars twice daily e11.65   aspirin EC 81 MG tablet Take 1 tablet (81 mg total) by mouth daily.   diphenhydrAMINE HCl 50 MG/30ML LIQD Take 30 mLs by mouth at bedtime as needed (sleep).   Dulaglutide 3 MG/0.5ML SOPN Inject 3 mg into the skin once a week.   furosemide (LASIX) 40 MG tablet Take 1 tablet (40 mg total) by mouth daily.   glimepiride (AMARYL) 2 MG tablet Take 1 tablet (2 mg total) by mouth daily before breakfast.   metFORMIN (GLUCOPHAGE-XR) 750 MG 24 hr tablet TAKE 2 TABLETS DAILY WITH BREAKFAST (DISCONTINUE ALL PREVIOUS PRESCRIPTIONS FOR METFORMIN)   metoprolol succinate (TOPROL-XL) 50 MG 24 hr tablet Take 1 tablet (50 mg total) by mouth daily.   omeprazole (PRILOSEC) 20 MG capsule Take 1 capsule (20 mg total) by mouth daily.   potassium chloride (KLOR-CON) 10 MEQ tablet Take 1 tablet (10 mEq total) by mouth daily.   rosuvastatin (CRESTOR) 10 MG tablet Take 1 tablet (10 mg total) by mouth daily.   sacubitril-valsartan (ENTRESTO) 49-51 MG Take 1 tablet by mouth 2 (two) times daily.   spironolactone (ALDACTONE) 25 MG tablet Take 1 tablet (25 mg total) by mouth daily.   No  facility-administered encounter medications on file as of 02/08/2023.    Surgical History: Past Surgical History:  Procedure Laterality Date   LEFT HEART CATH AND CORONARY ANGIOGRAPHY Left 04/12/2018   Procedure: LEFT HEART CATH AND CORONARY ANGIOGRAPHY;  Surgeon: Minna Merritts, MD;  Location: [facility];  Service: Cardiovascular;  Laterality: Left;    Medical History: Past Medical History:  Diagnosis Date   Anxiety    Cataract    Chronic HFrEF (heart failure with reduced ejection fraction) ([place])    a. 02/2018 Echo: EF 25-30%; b. 07/2018 Echo: EF 35-40%; c. 11/2020 Echo: EF 45-50%, diast dysfxn, nl RV fxn. Trace MR/TR.   COPD (chronic obstructive pulmonary disease) (HCC)    Diabetes mellitus without complication (HCC)    NICM (nonischemic cardiomyopathy) ([place])    a. 02/2018 Echo: EF 25-30%; b. 07/2018 Echo: EF 35-40%; c. 11/2020 Echo: EF 45-50%, diast dysfxn.   Nonobstructive CAD (coronary artery disease)    a. 03/2018 Cath: LM nl, LAD mild diff dzs prox, heavy Ca2+, 60d, LCX nl/large, OM1 50, RCA nl. EF <20%.   Sleep apnea     Family History: Family History  Problem Relation Age of Onset   Heart disease Mother    Lung cancer Father    Heart disease Maternal Grandmother    Heart Problems Maternal Grandmother    Heart Problems Brother    Heart Problems Maternal Grandfather     Social  History   Socioeconomic History   Marital status: Single    Spouse name: Not on file   Number of children: Not on file   Years of education: Not on file   Highest education level: Not on file  Occupational History   Not on file  Tobacco Use   Smoking status: Former    Packs/day: 1.50    Years: 40.00    Additional pack years: 0.00    Total pack years: 60.00    Types: Cigarettes   Smokeless tobacco: Never   Tobacco comments:    quit 2008  Vaping Use   Vaping Use: Never used  Substance and Sexual Activity   Alcohol use: Yes    Alcohol/week: 1.0 standard drink of alcohol     Types: 1 Shots of liquor per week    Comment: occasional drink   Drug use: Never   Sexual activity: Not on file  Other Topics Concern   Not on file  Social History Narrative   Not on file   Social Determinants of Health   Financial Resource Strain: Not on file  Food Insecurity: Not on file  Transportation Needs: Not on file  Physical Activity: Not on file  Stress: Not on file  Social Connections: Not on file  Intimate Partner Violence: Not on file      Review of Systems  Constitutional:  Positive for fatigue. Negative for chills and unexpected weight change.  HENT:  Negative for congestion, rhinorrhea, sneezing and sore throat.   Eyes:  Negative for redness.  Respiratory: Negative.  Negative for cough, chest tightness, shortness of breath and wheezing.   Cardiovascular: Negative.  Negative for chest pain and palpitations.  Gastrointestinal:  Negative for abdominal pain, constipation, diarrhea, nausea and vomiting.  Genitourinary:  Negative for dysuria and frequency.  Musculoskeletal:  Negative for arthralgias, back pain, joint swelling and neck pain.  Skin:  Negative for rash.  Neurological: Negative.  Negative for tremors and numbness.  Hematological:  Negative for adenopathy. Does not bruise/bleed easily.  Psychiatric/Behavioral:  Negative for behavioral problems (Depression), sleep disturbance and suicidal ideas. The patient is not nervous/anxious.     Vital Signs: BP (!) 144/66   Pulse 90   Temp 97.9 F (36.6 C)   Resp 16   Ht 5\' 8"  (1.727 m)   Wt 234 lb 12.8 oz (106.5 kg)   SpO2 97%   BMI 35.70 kg/m    Physical Exam Vitals reviewed.  Constitutional:      General: He is not in acute distress.    Appearance: Normal appearance. He is obese. He is not ill-appearing.  HENT:     Head: Normocephalic and atraumatic.  Eyes:     Pupils: Pupils are equal, round, and reactive to light.  Cardiovascular:     Rate and Rhythm: Normal rate and regular rhythm.   Pulmonary:     Effort: Pulmonary effort is normal. No respiratory distress.  Neurological:     Mental Status: He is alert and oriented to person, place, and time.  Psychiatric:        Mood and Affect: Mood normal.        Behavior: Behavior normal.        Assessment/Plan: 1. Uncontrolled type 2 diabetes mellitus with hyperglycemia (HCC) A1c is elevated, instructed patient to work on his diet and lifestyle modifications. Encouraged limitation of high carb and high sugar foods, and to start walking daily again.  - POCT glycosylated hemoglobin (Hb A1C)  2. Essential  hypertension Stable, sees cardiology, takes entresto  3. Seasonal allergies Continue OTC allergy medication  4. Other fatigue Continue to watch, call the clinic if gets worse.    General Counseling: ichiro baugher understanding of the findings of todays visit and agrees with plan of treatment. I have discussed any further diagnostic evaluation that may be needed or ordered today. We also reviewed his medications today. he has been encouraged to call the office with any questions or concerns that should arise related to todays visit.    Orders Placed This Encounter  Procedures   POCT glycosylated hemoglobin (Hb A1C)    No orders of the defined types were placed in this encounter.   Return in about 4 months (around 05/30/2023) for F/U, Recheck A1C, Xzayvion Vaeth PCP.   Total time spent:30 Minutes Time spent includes review of chart, medications, test results, and follow up plan with the patient.   [place] Controlled Substance Database was reviewed by me.  This patient was seen by Jonetta Osgood, FNP-C in collaboration with Dr. Clayborn Bigness as a part of collaborative care agreement.   Courtni Balash R. Valetta Fuller, MSN, FNP-C Internal medicine

## 2023-02-11 ENCOUNTER — Encounter: Payer: Self-pay | Admitting: Nurse Practitioner

## 2023-02-12 ENCOUNTER — Telehealth: Payer: Self-pay | Admitting: Cardiovascular Disease

## 2023-02-12 NOTE — Telephone Encounter (Signed)
Pt c/o medication issue:  1. Name of Medication:   sacubitril-valsartan (ENTRESTO) 49-51 MG    2. How are you currently taking this medication (dosage and times per day)? Take 1 tablet by mouth 2 (two) times daily.   3. Are you having a reaction (difficulty breathing--STAT)? No  4. What is your medication issue? Pt is calling stating that [employer] never received a fax about his [facility] prescription. Patient gave the [employer] phone number of [phone], but did not have [employer] number. Patient would like a call back about this matter. Please advise

## 2023-02-12 NOTE — Telephone Encounter (Signed)
Called [employer] and was informed pt is approved for Entresto through 11/20/23. Pt made aware

## 2023-02-19 ENCOUNTER — Encounter: Payer: Self-pay | Admitting: Internal Medicine

## 2023-02-19 ENCOUNTER — Ambulatory Visit (INDEPENDENT_AMBULATORY_CARE_PROVIDER_SITE_OTHER): Payer: Medicare Other | Admitting: Internal Medicine

## 2023-02-19 VITALS — BP 119/71 | HR 84 | Temp 97.8°F | Resp 16 | Ht 68.0 in | Wt 233.6 lb

## 2023-02-19 DIAGNOSIS — J4489 Other specified chronic obstructive pulmonary disease: Secondary | ICD-10-CM | POA: Diagnosis not present

## 2023-02-19 DIAGNOSIS — I5022 Chronic systolic (congestive) heart failure: Secondary | ICD-10-CM | POA: Diagnosis not present

## 2023-02-19 DIAGNOSIS — G4733 Obstructive sleep apnea (adult) (pediatric): Secondary | ICD-10-CM | POA: Diagnosis not present

## 2023-02-19 NOTE — Patient Instructions (Signed)
Chronic Obstructive Pulmonary Disease  Chronic obstructive pulmonary disease (COPD) is a long-term (chronic) lung problem. When you have COPD, it is hard for air to get in and out of your lungs. Usually the condition gets worse over time, and your lungs will never return to normal. There are things you can do to keep yourself as healthy as possible. What are the causes? Smoking. This is the most common cause. Certain genes passed from parent to child (inherited). What increases the risk? Being exposed to secondhand smoke from cigarettes, pipes, or cigars. Being exposed to chemicals and other irritants, such as fumes and dust in the work environment. Having chronic lung conditions or infections. What are the signs or symptoms? Shortness of breath, especially during physical activity. A long-term cough with a large amount of thick mucus. Sometimes, the cough may not have any mucus (dry cough). Wheezing. Breathing quickly. Skin that looks gray or blue, especially in the fingers, toes, or lips. Feeling tired (fatigue). Weight loss. Chest tightness. Having infections often. Episodes when breathing symptoms become much worse (exacerbations). At the later stages of this disease, you may have swelling in the ankles, feet, or legs. How is this treated? Taking medicines. Quitting smoking, if you smoke. Rehabilitation. This includes steps to make your body work better. It may involve a team of specialists. Doing exercises. Making changes to your diet. Using oxygen. Lung surgery. Lung transplant. Comfort measures (palliative care). Follow these instructions at home: Medicines Take over-the-counter and prescription medicines only as told by your doctor. Talk to your doctor before taking any cough or allergy medicines. You may need to avoid medicines that cause your lungs to be dry. Lifestyle If you smoke, stop smoking. Smoking makes the problem worse. Do not smoke or use any products that  contain nicotine or tobacco. If you need help quitting, ask your doctor. Avoid being around things that make your breathing worse. This may include smoke, chemicals, and fumes. Stay active, but remember to rest as well. Learn and use tips on how to manage stress and control your breathing. Make sure you get enough sleep. Most adults need at least 7 hours of sleep every night. Eat healthy foods. Eat smaller meals more often. Rest before meals. Controlled breathing Learn and use tips on how to control your breathing as told by your doctor. Try: Breathing in (inhaling) through your nose for 1 second. Then, pucker your lips and breath out (exhale) through your lips for 2 seconds. Putting one hand on your belly (abdomen). Breathe in slowly through your nose for 1 second. Your hand on your belly should move out. Pucker your lips and breathe out slowly through your lips. Your hand on your belly should move in as you breathe out.  Controlled coughing Learn and use controlled coughing to clear mucus from your lungs. Follow these steps: Lean your head a little forward. Breathe in deeply. Try to hold your breath for 3 seconds. Keep your mouth slightly open while coughing 2 times. Spit any mucus out into a tissue. Rest and do the steps again 1 or 2 times as needed. General instructions Make sure you get all the shots (vaccines) that your doctor recommends. Ask your doctor about a flu shot and a pneumonia shot. Use oxygen therapy and pulmonary rehabilitation if told by your doctor. If you need home oxygen therapy, ask your doctor if you should buy a tool to measure your oxygen level (oximeter). Make a COPD action plan with your doctor. This helps you   to know what to do if you feel worse than usual. Manage any other conditions you have as told by your doctor. Avoid going outside when it is very hot, cold, or humid. Avoid people who have a sickness you can catch (contagious). Keep all follow-up  visits. Contact a doctor if: You cough up more mucus than usual. There is a change in the color or thickness of the mucus. It is harder to breathe than usual. Your breathing is faster than usual. You have trouble sleeping. You need to use your medicines more often than usual. You have trouble doing your normal activities such as getting dressed or walking around the house. Get help right away if: You have shortness of breath while resting. You have shortness of breath that stops you from: Being able to talk. Doing normal activities. Your chest hurts for longer than 5 minutes. Your skin color is more blue than usual. Your pulse oximeter shows that you have low oxygen for longer than 5 minutes. You have a fever. You feel too tired to breathe normally. These symptoms may represent a serious problem that is an emergency. Do not wait to see if the symptoms will go away. Get medical help right away. Call your local emergency services (911 in the [place] Do not drive yourself to the hospital. Summary Chronic obstructive pulmonary disease (COPD) is a long-term lung problem. The way your lungs work will never return to normal. Usually the condition gets worse over time. There are things you can do to keep yourself as healthy as possible. Take over-the-counter and prescription medicines only as told by your doctor. If you smoke, stop. Smoking makes the problem worse. This information is not intended to replace advice given to you by your health care provider. Make sure you discuss any questions you have with your health care provider. Document Revised: 09/14/2020 Document Reviewed: 09/14/2020 [employer] Patient Education  2023 [employer]  

## 2023-02-19 NOTE — Progress Notes (Signed)
[facility] [place], [place] [address]  Pulmonary Sleep Medicine   Office Visit Note  Patient Name: Joe Berry DOB: Sep 06, 1956 MRN [id number]  Date of Service: 02/19/2023  Complaints/HPI: He is doing ok overall. Sees his cardiology every year. His EF has been stable. Patient states he has some shortness of breath not as bad asit has been in the past. Notices some fatigue. States he has not been able to use CPAP and has no interest in trying again with the PAP therapy. He states he sleeps in a recliner. He and I spoke about the issues with CHF and OSA but he he does not want to try again. His weight has been a an issues needs to work on losing. He needs to have an updated PFT. Currently is not smoking  ROS  General: (-) fever, (-) chills, (-) night sweats, (-) weakness Skin: (-) rashes, (-) itching,. Eyes: (-) visual changes, (-) redness, (-) itching. Nose and Sinuses: (-) nasal stuffiness or itchiness, (-) postnasal drip, (-) nosebleeds, (-) sinus trouble. Mouth and Throat: (-) sore throat, (-) hoarseness. Neck: (-) swollen glands, (-) enlarged thyroid, (-) neck pain. Respiratory: - cough, (-) bloody sputum, + shortness of breath, - wheezing. Cardiovascular: - ankle swelling, (-) chest pain. Lymphatic: (-) lymph node enlargement. Neurologic: (-) numbness, (-) tingling. Psychiatric: (-) anxiety, (-) depression   Current Medication: Outpatient Encounter Medications as of 02/19/2023  Medication Sig   ACCU-CHEK FASTCLIX LANCETS MISC Use to test blood sugars twice daily e11.65   aspirin EC 81 MG tablet Take 1 tablet (81 mg total) by mouth daily.   diphenhydrAMINE HCl 50 MG/30ML LIQD Take 30 mLs by mouth at bedtime as needed (sleep).   Dulaglutide 3 MG/0.5ML SOPN Inject 3 mg into the skin once a week.   furosemide (LASIX) 40 MG tablet Take 1 tablet (40 mg total) by mouth daily.   glimepiride (AMARYL) 2 MG tablet Take 1 tablet (2 mg total) by mouth daily before  breakfast.   metFORMIN (GLUCOPHAGE-XR) 750 MG 24 hr tablet TAKE 2 TABLETS DAILY WITH BREAKFAST (DISCONTINUE ALL PREVIOUS PRESCRIPTIONS FOR METFORMIN)   metoprolol succinate (TOPROL-XL) 50 MG 24 hr tablet Take 1 tablet (50 mg total) by mouth daily.   omeprazole (PRILOSEC) 20 MG capsule Take 1 capsule (20 mg total) by mouth daily.   potassium chloride (KLOR-CON) 10 MEQ tablet Take 1 tablet (10 mEq total) by mouth daily.   rosuvastatin (CRESTOR) 10 MG tablet Take 1 tablet (10 mg total) by mouth daily.   sacubitril-valsartan (ENTRESTO) 49-51 MG Take 1 tablet by mouth 2 (two) times daily.   spironolactone (ALDACTONE) 25 MG tablet Take 1 tablet (25 mg total) by mouth daily.   No facility-administered encounter medications on file as of 02/19/2023.    Surgical History: Past Surgical History:  Procedure Laterality Date   LEFT HEART CATH AND CORONARY ANGIOGRAPHY Left 04/12/2018   Procedure: LEFT HEART CATH AND CORONARY ANGIOGRAPHY;  Surgeon: Minna Merritts, MD;  Location: [facility];  Service: Cardiovascular;  Laterality: Left;    Medical History: Past Medical History:  Diagnosis Date   Anxiety    Cataract    Chronic HFrEF (heart failure with reduced ejection fraction)    a. 02/2018 Echo: EF 25-30%; b. 07/2018 Echo: EF 35-40%; c. 11/2020 Echo: EF 45-50%, diast dysfxn, nl RV fxn. Trace MR/TR.   COPD (chronic obstructive pulmonary disease)    Diabetes mellitus without complication    NICM (nonischemic cardiomyopathy)  a. 02/2018 Echo: EF 25-30%; b. 07/2018 Echo: EF 35-40%; c. 11/2020 Echo: EF 45-50%, diast dysfxn.   Nonobstructive CAD (coronary artery disease)    a. 03/2018 Cath: LM nl, LAD mild diff dzs prox, heavy Ca2+, 60d, LCX nl/large, OM1 50, RCA nl. EF <20%.   Sleep apnea     Family History: Family History  Problem Relation Age of Onset   Heart disease Mother    Lung cancer Father    Heart disease Maternal Grandmother    Heart Problems Maternal Grandmother    Heart Problems  Brother    Heart Problems Maternal Grandfather     Social History: Social History   Socioeconomic History   Marital status: Single    Spouse name: Not on file   Number of children: Not on file   Years of education: Not on file   Highest education level: Not on file  Occupational History   Not on file  Tobacco Use   Smoking status: Former    Packs/day: 1.50    Years: 40.00    Additional pack years: 0.00    Total pack years: 60.00    Types: Cigarettes   Smokeless tobacco: Never   Tobacco comments:    quit 2008  Vaping Use   Vaping Use: Never used  Substance and Sexual Activity   Alcohol use: Yes    Alcohol/week: 1.0 standard drink of alcohol    Types: 1 Shots of liquor per week    Comment: occasional drink   Drug use: Never   Sexual activity: Not on file  Other Topics Concern   Not on file  Social History Narrative   Not on file   Social Determinants of Health   Financial Resource Strain: Not on file  Food Insecurity: Not on file  Transportation Needs: Not on file  Physical Activity: Not on file  Stress: Not on file  Social Connections: Not on file  Intimate Partner Violence: Not on file    Vital Signs: Blood pressure 119/71, pulse 84, temperature 97.8 F (36.6 C), resp. rate 16, height 5\' 8"  (1.727 m), weight 233 lb 9.6 oz (106 kg), SpO2 95 %.  Examination: General Appearance: The patient is well-developed, well-nourished, and in no distress. Skin: Gross inspection of skin unremarkable. Head: normocephalic, no gross deformities. Eyes: no gross deformities noted. ENT: ears appear grossly normal no exudates. Neck: Supple. No thyromegaly. No LAD. Respiratory: no rhonchi noted. Cardiovascular: Normal S1 and S2 without murmur or rub. Extremities: No cyanosis. pulses are equal. Neurologic: Alert and oriented. No involuntary movements.  LABS: Recent Results (from the past 2160 hour(s))  Cologuard     Status: None   Collection Time: 11/24/22  8:00 AM   Result Value Ref Range   COLOGUARD Negative Negative    Comment:  NEGATIVE TEST RESULT. A negative Cologuard result indicates a low likelihood that a colorectal cancer (CRC) or advanced adenoma (adenomatous polyps with more advanced pre-malignant features)  is present. The chance that a person with a negative Cologuard test has a colorectal cancer is less than 1 in 1500 (negative predictive value >99.9%) or has an  advanced adenoma is less than  5.3% (negative predictive value 94.7%). These data are based on a prospective cross-sectional study of 10,000 individuals at average risk for colorectal cancer who were screened with both Cologuard and colonoscopy. (Imperiale T. et al, N Engl J Med 2014;370(14):1286-1297) The normal value (reference range) for this assay is negative.  COLOGUARD RE-SCREENING RECOMMENDATION: Periodic colorectal cancer screening  is an important part of preventive healthcare for asymptomatic individuals at average risk for colorectal cancer.  Following a negative Cologuard result, the [place] Task Force screening guidelines recommend a Cologuard re-screening interval of 3 years.  References: American Cancer Society Guideline for Colorectal Cancer Screening: https://www.cancer.org/cancer/colon-rectal-cancer/detection-diagnosis-staging/acs-recommendations.html.; Rex DK, Boland CR, Dominitz JK, Colorectal Cancer Screening: Recommendations for Physicians and Patients from the [place] Task Force on Colorectal Cancer Screening , Am J Gastroenterology 2017; N9471014.  TEST DESCRIPTION: Composite algorithmic analysis of stool DNA-biomarkers with hemoglobin immunoassay.   Quantitative values of individual biomarkers are not reportable and are not associated with individual biomarker result reference ranges. Cologuard is intended for colorectal cancer screening of adults of either sex, 28 years or older, who are at average-risk for  colorectal cancer (CRC). Cologuard has been approved for use by the U.S. FDA. The performance of Cologuard was  established in a cross sectional study of average-risk adults aged 47-84. Cologuard performance in patients ages 51 to 9 years was estimated by sub-group analysis of near-age groups. Colonoscopies performed for a positive result may find as the most clinically significant lesion: colorectal cancer [4.0%], advanced adenoma (including sessile serrated polyps greater than or equal to 1cm diameter) [20%] or non- advanced adenoma [31%]; or no colorectal neoplasia [45%]. These estimates are derived from a prospective cross-sectional screening study of 10,000 individuals at average risk for colorectal cancer who were screened with both Cologuard and colonoscopy. (Imperiale T. et al, Alison Stalling J Med 2014;370(14):1286-1297.) Cologuard may produce a false negative or false positive result (no colorectal cancer or precancerous polyp present at colonoscopy follow up). A negative Cologuard test result does not guarantee the absence of CRC or advanced adenoma (pre-cancer). The current Cologuard  screening interval is every 3 years. 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 Cologuard performance data in a 10,000 patient pivotal study using colonoscopy as the reference method can be accessed at the following location: www.exactlabs.com/results. Additional description of the Cologuard test process, warnings and precautions can be found at www.cologuard.com.   POCT glycosylated hemoglobin (Hb A1C)     Status: Abnormal   Collection Time: 02/08/23 10:37 AM  Result Value Ref Range   Hemoglobin A1C 9.4 (A) 4.0 - 5.6 %   HbA1c POC (<> result, manual entry)     HbA1c, POC (prediabetic range)     HbA1c, POC (controlled diabetic range)      Radiology: CT Chest Wo Contrast  Result Date: 03/02/2022 CLINICAL DATA:  Follow-up multiple pulmonary nodules less than 6 mm. EXAM: CT CHEST WITHOUT CONTRAST  TECHNIQUE: Multidetector CT imaging of the chest was performed following the standard protocol without IV contrast. RADIATION DOSE REDUCTION: This exam was performed according to the departmental dose-optimization program which includes automated exposure control, adjustment of the mA and/or kV according to patient size and/or use of iterative reconstruction technique. COMPARISON:  Most recent comparison 10/07/2020, most remote comparison 02/06/2018 FINDINGS: Cardiovascular: Aortic atherosclerosis without aneurysm. Coronary artery calcifications or stents. Heart is normal in size. Minimal anterior pericardial fluid/thickening, chronic and unchanged. Mediastinum/Nodes: No enlarged mediastinal lymph nodes. No obvious bulky hilar adenopathy in this unenhanced exam. No esophageal wall thickening. No visualized thyroid nodule. Lungs/Pleura: 3-4 mm right upper lobe nodule series 3, image 61, stable dating back to 2019 exam, definitively benign. 2-3 mm right lower lobe nodule series 3, image 99, stable from 2020 exam, definitively benign. There are no new or enlarging pulmonary nodules. Mild apical predominant emphysema. Pleural calcifications  in the mid lower right hemithorax with associated pleural thickening and adjacent scarring, stable from prior exams. There is mild biapical pleuroparenchymal scarring. No acute airspace disease. No pleural effusion. Upper Abdomen: No acute or unexpected findings. Musculoskeletal: There are no acute or suspicious osseous abnormalities. Minimal left gynecomastia. IMPRESSION: 1. The small right-sided pulmonary nodules are stable dating back to 2019 and 2020 exam respectively, stability for greater than 2 years is consistent with a definitively benign etiology. No dedicated further follow-up is recommended. 2. No new pulmonary nodules. 3. Chronic pleural thickening and calcifications in the mid lower right hemithorax with scarring in the right lung, stable. 4. Mild emphysema. 5. Aortic  atherosclerosis. Coronary artery calcifications or stents. Aortic Atherosclerosis (ICD10-I70.0) and Emphysema (ICD10-J43.9). Electronically Signed   By: Keith Rake M.D.   On: 03/02/2022 20:15    No results found.  No results found.    Assessment and Plan: Patient Active Problem List   Diagnosis Date Noted   Chronic HFrEF (heart failure with reduced ejection fraction) 06/14/2021   Pulmonary HTN 05/04/2018   Unstable angina 04/04/2018   NICM (nonischemic cardiomyopathy) 04/03/2018   CAD (coronary artery disease) 04/03/2018   Empyema lung 03/12/2018   Pleural effusion 03/12/2018   Pleural plaque without asbestos 03/12/2018   SOB (shortness of breath) 03/12/2018   Lung mass 03/12/2018    1. OSA (obstructive sleep apnea) He is not willing and able to use CPAP. He states he sleeps in a recliner and this has helped  2. Obesity, morbid Obesity Counseling: Had a lengthy discussion regarding patients BMI and weight issues. Patient was instructed on portion control as well as increased activity. Also discussed caloric restrictions with trying to maintain intake less than 2000 Kcal.   3. Obstructive chronic bronchitis without exacerbation Stable with current regimen. We will get a follow up PFT  4. Chronic HFrEF (heart failure with reduced ejection fraction) Continue to follow up with cardiology   General Counseling: I have discussed the findings of the evaluation and examination with Louie Casa.  I have also discussed any further diagnostic evaluation thatmay be needed or ordered today. Eivan verbalizes understanding of the findings of todays visit. We also reviewed his medications today and discussed drug interactions and side effects including but not limited excessive drowsiness and altered mental states. We also discussed that there is always a risk not just to him but also people around him. he has been encouraged to call the office with any questions or concerns that should arise  related to todays visit.  Orders Placed This Encounter  Procedures   Pulmonary function test    Standing Status:   Future    Standing Expiration Date:   02/19/2024    Order Specific Question:   Where should this test be performed?    Answer:   [facility]     Time spent: 60  I have personally obtained a history, examined the patient, evaluated laboratory and imaging results, formulated the assessment and plan and placed orders.    Allyne Gee, MD [facility] Pulmonary and Critical Care Sleep medicine

## 2023-05-09 ENCOUNTER — Telehealth: Payer: Self-pay | Admitting: Nurse Practitioner

## 2023-05-09 NOTE — Telephone Encounter (Signed)
Diabetic eye exam scheduled for 07/17/23 @ 12:00 at [facility]

## 2023-05-30 ENCOUNTER — Ambulatory Visit (INDEPENDENT_AMBULATORY_CARE_PROVIDER_SITE_OTHER): Payer: Medicare Other | Admitting: Nurse Practitioner

## 2023-05-30 ENCOUNTER — Encounter: Payer: Self-pay | Admitting: Nurse Practitioner

## 2023-05-30 VITALS — BP 120/76 | HR 89 | Temp 98.6°F | Resp 16 | Ht 68.0 in | Wt 236.2 lb

## 2023-05-30 DIAGNOSIS — K219 Gastro-esophageal reflux disease without esophagitis: Secondary | ICD-10-CM | POA: Diagnosis not present

## 2023-05-30 DIAGNOSIS — E1159 Type 2 diabetes mellitus with other circulatory complications: Secondary | ICD-10-CM | POA: Insufficient documentation

## 2023-05-30 DIAGNOSIS — E1165 Type 2 diabetes mellitus with hyperglycemia: Secondary | ICD-10-CM

## 2023-05-30 DIAGNOSIS — I10 Essential (primary) hypertension: Secondary | ICD-10-CM | POA: Diagnosis not present

## 2023-05-30 HISTORY — DX: Essential (primary) hypertension: I10

## 2023-05-30 LAB — POCT GLYCOSYLATED HEMOGLOBIN (HGB A1C): Hemoglobin A1C: 9.3 % — AB (ref 4.0–5.6)

## 2023-05-30 MED ORDER — GLIPIZIDE ER 5 MG PO TB24: 5.0000 mg | ORAL_TABLET | Freq: Every day | ORAL | 1 refills | Status: DC

## 2023-05-30 NOTE — Progress Notes (Signed)
[facility] [address] [place], [place] [address]  Internal MEDICINE  Office Visit Note  Patient Name: Joe Berry  [id number]  [id number]  Date of Service: 05/30/2023  Chief Complaint  Patient presents with   Diabetes   Follow-up    HPI Shailen presents for a follow-up visit for diabetes, hypertension and GERD.  Diabetes -- A1c improved by 0.1 to 9.3, taking glimepiride, metformin xr and trulicity Hypertension -- controlled with medications GERD -- takes omeprazole     Current Medication: Outpatient Encounter Medications as of 05/30/2023  Medication Sig   ACCU-CHEK FASTCLIX LANCETS MISC Use to test blood sugars twice daily e11.65   aspirin EC 81 MG tablet Take 1 tablet (81 mg total) by mouth daily.   diphenhydrAMINE HCl 50 MG/30ML LIQD Take 30 mLs by mouth at bedtime as needed (sleep).   Dulaglutide 3 MG/0.5ML SOPN Inject 3 mg into the skin once a week.   furosemide (LASIX) 40 MG tablet Take 1 tablet (40 mg total) by mouth daily.   glipiZIDE (GLUCOTROL XL) 5 MG 24 hr tablet Take 1 tablet (5 mg total) by mouth daily with breakfast.   metFORMIN (GLUCOPHAGE-XR) 750 MG 24 hr tablet TAKE 2 TABLETS DAILY WITH BREAKFAST (DISCONTINUE ALL PREVIOUS PRESCRIPTIONS FOR METFORMIN)   metoprolol succinate (TOPROL-XL) 50 MG 24 hr tablet Take 1 tablet (50 mg total) by mouth daily.   omeprazole (PRILOSEC) 20 MG capsule Take 1 capsule (20 mg total) by mouth daily.   potassium chloride (KLOR-CON) 10 MEQ tablet Take 1 tablet (10 mEq total) by mouth daily.   rosuvastatin (CRESTOR) 10 MG tablet Take 1 tablet (10 mg total) by mouth daily.   sacubitril-valsartan (ENTRESTO) 49-51 MG Take 1 tablet by mouth 2 (two) times daily.   spironolactone (ALDACTONE) 25 MG tablet Take 1 tablet (25 mg total) by mouth daily.   [DISCONTINUED] glimepiride (AMARYL) 2 MG tablet Take 1 tablet (2 mg total) by mouth daily before breakfast.   No facility-administered encounter medications on file as of  05/30/2023.    Surgical History: Past Surgical History:  Procedure Laterality Date   LEFT HEART CATH AND CORONARY ANGIOGRAPHY Left 04/12/2018   Procedure: LEFT HEART CATH AND CORONARY ANGIOGRAPHY;  Surgeon: Antonieta Iba, MD;  Location: [facility];  Service: Cardiovascular;  Laterality: Left;    Medical History: Past Medical History:  Diagnosis Date   Anxiety    Cataract    Chronic HFrEF (heart failure with reduced ejection fraction) (HCC)    a. 02/2018 Echo: EF 25-30%; b. 07/2018 Echo: EF 35-40%; c. 11/2020 Echo: EF 45-50%, diast dysfxn, nl RV fxn. Trace MR/TR.   COPD (chronic obstructive pulmonary disease) (HCC)    Diabetes mellitus without complication (HCC)    NICM (nonischemic cardiomyopathy) (HCC)    a. 02/2018 Echo: EF 25-30%; b. 07/2018 Echo: EF 35-40%; c. 11/2020 Echo: EF 45-50%, diast dysfxn.   Nonobstructive CAD (coronary artery disease)    a. 03/2018 Cath: LM nl, LAD mild diff dzs prox, heavy Ca2+, 60d, LCX nl/large, OM1 50, RCA nl. EF <20%.   Sleep apnea     Family History: Family History  Problem Relation Age of Onset   Heart disease Mother    Lung cancer Father    Heart disease Maternal Grandmother    Heart Problems Maternal Grandmother    Heart Problems Brother    Heart Problems Maternal Grandfather     Social History   Socioeconomic History   Marital status: Single  Spouse name: Not on file   Number of children: Not on file   Years of education: Not on file   Highest education level: Not on file  Occupational History   Not on file  Tobacco Use   Smoking status: Former    Packs/day: 1.50    Years: 40.00    Additional pack years: 0.00    Total pack years: 60.00    Types: Cigarettes   Smokeless tobacco: Never   Tobacco comments:    quit 2008  Vaping Use   Vaping Use: Never used  Substance and Sexual Activity   Alcohol use: Yes    Alcohol/week: 1.0 standard drink of alcohol    Types: 1 Shots of liquor per week    Comment: occasional  drink   Drug use: Never   Sexual activity: Not on file  Other Topics Concern   Not on file  Social History Narrative   Not on file   Social Determinants of Health   Financial Resource Strain: Not on file  Food Insecurity: Not on file  Transportation Needs: Not on file  Physical Activity: Not on file  Stress: Not on file  Social Connections: Not on file  Intimate Partner Violence: Not on file      Review of Systems  Constitutional:  Positive for fatigue. Negative for chills and unexpected weight change.  HENT:  Negative for congestion, rhinorrhea, sneezing and sore throat.   Eyes:  Negative for redness.  Respiratory: Negative.  Negative for cough, chest tightness, shortness of breath and wheezing.   Cardiovascular: Negative.  Negative for chest pain and palpitations.  Gastrointestinal:  Negative for abdominal pain, constipation, diarrhea, nausea and vomiting.  Genitourinary:  Negative for dysuria and frequency.  Musculoskeletal:  Negative for arthralgias, back pain, joint swelling and neck pain.  Skin:  Negative for rash.  Neurological: Negative.  Negative for tremors and numbness.  Hematological:  Negative for adenopathy. Does not bruise/bleed easily.  Psychiatric/Behavioral:  Negative for behavioral problems (Depression), sleep disturbance and suicidal ideas. The patient is not nervous/anxious.     Vital Signs: BP 120/76   Pulse 89   Temp 98.6 F (37 C)   Resp 16   Ht 5\' 8"  (1.727 m)   Wt 236 lb 3.2 oz (107.1 kg)   SpO2 94%   BMI 35.91 kg/m    Physical Exam Vitals reviewed.  Constitutional:      General: He is not in acute distress.    Appearance: Normal appearance. He is obese. He is not ill-appearing.  HENT:     Head: Normocephalic and atraumatic.  Eyes:     Pupils: Pupils are equal, round, and reactive to light.  Cardiovascular:     Rate and Rhythm: Normal rate and regular rhythm.  Pulmonary:     Effort: Pulmonary effort is normal. No respiratory  distress.  Neurological:     Mental Status: He is alert and oriented to person, place, and time.  Psychiatric:        Mood and Affect: Mood normal.        Behavior: Behavior normal.        Assessment/Plan: 1. Uncontrolled type 2 diabetes mellitus with hyperglycemia (HCC) Slight improvement but still not having a good diet, encouraged diet modifications and glimepiride discontinue, start glipizide XL 5 mg daily. Follow up in 4 months  - POCT glycosylated hemoglobin (Hb A1C)  2. Essential hypertension Continue medications as prescribed.   3. Gastroesophageal reflux disease without esophagitis Continue omeprazole as  prescribed.     General Counseling: daeshaun specht understanding of the findings of todays visit and agrees with plan of treatment. I have discussed any further diagnostic evaluation that may be needed or ordered today. We also reviewed his medications today. he has been encouraged to call the office with any questions or concerns that should arise related to todays visit.    Orders Placed This Encounter  Procedures   POCT glycosylated hemoglobin (Hb A1C)    Meds ordered this encounter  Medications   glipiZIDE (GLUCOTROL XL) 5 MG 24 hr tablet    Sig: Take 1 tablet (5 mg total) by mouth daily with breakfast.    Dispense:  90 tablet    Refill:  1    Discontinue glimepiride and fill new script today    Return in about 4 months (around 09/30/2023) for F/U, Recheck A1C, Connie Lasater PCP.   Total time spent:30 Minutes Time spent includes review of chart, medications, test results, and follow up plan with the patient.   [place] Controlled Substance Database was reviewed by me.  This patient was seen by Sallyanne Kuster, FNP-C in collaboration with Dr. Beverely Risen as a part of collaborative care agreement.   Eilam Shrewsbury R. Tedd Sias, MSN, FNP-C Internal medicine

## 2023-09-16 ENCOUNTER — Other Ambulatory Visit: Payer: Self-pay | Admitting: Nurse Practitioner

## 2023-09-16 DIAGNOSIS — E1165 Type 2 diabetes mellitus with hyperglycemia: Secondary | ICD-10-CM

## 2023-09-18 ENCOUNTER — Other Ambulatory Visit: Payer: Self-pay | Admitting: Nurse Practitioner

## 2023-09-18 DIAGNOSIS — E1165 Type 2 diabetes mellitus with hyperglycemia: Secondary | ICD-10-CM

## 2023-09-20 ENCOUNTER — Other Ambulatory Visit: Payer: Self-pay | Admitting: Nurse Practitioner

## 2023-09-20 DIAGNOSIS — E1165 Type 2 diabetes mellitus with hyperglycemia: Secondary | ICD-10-CM

## 2023-09-27 ENCOUNTER — Telehealth: Payer: Self-pay | Admitting: Cardiovascular Disease

## 2023-09-27 NOTE — Telephone Encounter (Signed)
Patient came in dropped off PAF placed in [employer] box.

## 2023-09-28 NOTE — Telephone Encounter (Signed)
Application completed and faxed to company 

## 2023-10-01 ENCOUNTER — Encounter: Payer: Self-pay | Admitting: Nurse Practitioner

## 2023-10-01 ENCOUNTER — Telehealth: Payer: Self-pay | Admitting: Nurse Practitioner

## 2023-10-01 ENCOUNTER — Ambulatory Visit (INDEPENDENT_AMBULATORY_CARE_PROVIDER_SITE_OTHER): Payer: Medicare Other | Admitting: Nurse Practitioner

## 2023-10-01 VITALS — BP 130/86 | HR 88 | Temp 98.4°F | Resp 16 | Ht 68.0 in | Wt 235.2 lb

## 2023-10-01 DIAGNOSIS — E1165 Type 2 diabetes mellitus with hyperglycemia: Secondary | ICD-10-CM | POA: Diagnosis not present

## 2023-10-01 DIAGNOSIS — E1136 Type 2 diabetes mellitus with diabetic cataract: Secondary | ICD-10-CM | POA: Diagnosis not present

## 2023-10-01 DIAGNOSIS — J208 Acute bronchitis due to other specified organisms: Secondary | ICD-10-CM | POA: Diagnosis not present

## 2023-10-01 DIAGNOSIS — I10 Essential (primary) hypertension: Secondary | ICD-10-CM

## 2023-10-01 DIAGNOSIS — B9689 Other specified bacterial agents as the cause of diseases classified elsewhere: Secondary | ICD-10-CM

## 2023-10-01 LAB — POCT GLYCOSYLATED HEMOGLOBIN (HGB A1C): Hemoglobin A1C: 9.8 % — AB (ref 4.0–5.6)

## 2023-10-01 MED ORDER — DOXYCYCLINE HYCLATE 100 MG PO TABS: 100.0000 mg | ORAL_TABLET | Freq: Two times a day (BID) | ORAL | 0 refills | Status: AC

## 2023-10-01 NOTE — Progress Notes (Signed)
[facility] [address] [place], [place] [address]  Internal MEDICINE  Office Visit Note  Patient Name: Joe Berry  [id number]  [id number]  Date of Service: 10/01/2023  Chief Complaint  Patient presents with   Diabetes   Hypertension   Follow-up    HPI Joe Berry presents for a follow-up visit for diabetes, eye problems and chest congestion.   Nutrition Risk Assessment:  Has the patient had any N/V/D within the last 2 months?  Yes  some diarrhea occasionally  Does the patient have any non-healing wounds?  No  Has the patient had any unintentional weight loss or weight gain?  Yes  Has the patient had any polyphagia, polydipsia, or polyuria in the past 2 months?  No  Are you eating regular meals 3 times a day with small healthy protein snacks?  No  2 meals per day, and some unhealthy snacks, per patient he has been eating a lot of unhealthy foods.  Are you following a specific type of diet?  No  Do you use intermittent fasting? None   Diabetes:  Is the patient diabetic?  Yes  If diabetic, was a CBG obtained today?  No  patient has not been check sugars at all for a long time.  Most recent Hgb A1c?  Lab Results  Component Value Date   HGBA1C 9.8 (A) 10/01/2023   HGBA1C 9.3 (A) 05/30/2023   HGBA1C 9.4 (A) 02/08/2023   Does the patient use a CGM such as Dexcom or Freestyle Libre?  No  Did the patient bring their glucometer from home, CGM receiver or a record of their glucose readings?  No  How often do they monitor their glucose level? Have not been regularly, will start checking per patient.  Are they on any insulin? None  Basal, preprandial bolus or both? None  Have you had any significant hypoglycemic events since your last visit (glucose <70) requiring intervention? no Have you been hospitalized or visited the ER due to you diabetes since your last office visit? None  Are you experiencing any adverse side effects of your diabetes medications? None    Financial Strains and Diabetes Management:  Are you having any financial strains with devices, supplies or your medication? Yes . Patient assistance with trulicity  Does the patient want to be seen by Chronic Care Management for management of their diabetes?  No  Would the patient like additional information/handouts about diabetes or any specific related topics?  Yes  Would the patient like to be referred to a Nutritionist? No , wants to try on own and then will discuss later    Diabetic Exams:  Diabetic Eye Exam: Overdue for diabetic eye exam. Pt has been advised about the importance in completing this exam. Patient advised to call and schedule an eye exam. Diabetic Foot Exam: Overdue, Pt has been advised about the importance in completing this exam. Pt is scheduled for diabetic foot exam on 11/08/2023.   Has chest congestion more than a week, yellow/green sputum, wheezing and cough No SOB, no fever, chills, or sinus issues.    Current Medication: Outpatient Encounter Medications as of 10/01/2023  Medication Sig   ACCU-CHEK FASTCLIX LANCETS MISC Use to test blood sugars twice daily e11.65   aspirin EC 81 MG tablet Take 1 tablet (81 mg total) by mouth daily.   diphenhydrAMINE HCl 50 MG/30ML LIQD Take 30 mLs by mouth at bedtime as needed (sleep).   doxycycline (VIBRA-TABS) 100 MG tablet Take 1 tablet (100 mg  total) by mouth 2 (two) times daily for 10 days. Take with food   Dulaglutide (TRULICITY) 3 MG/0.5ML SOAJ INJECT 3 MG (0.5 ML) UNDER THE SKIN ONCE A WEEK   furosemide (LASIX) 40 MG tablet Take 1 tablet (40 mg total) by mouth daily.   glipiZIDE (GLUCOTROL XL) 5 MG 24 hr tablet Take 1 tablet (5 mg total) by mouth daily with breakfast.   metFORMIN (GLUCOPHAGE-XR) 750 MG 24 hr tablet TAKE 2 TABLETS DAILY WITH BREAKFAST (DISCONTINUE ALL PREVIOUS PRESCRIPTIONS FOR METFORMIN)   metoprolol succinate (TOPROL-XL) 50 MG 24 hr tablet Take 1 tablet (50 mg total) by mouth daily.   omeprazole  (PRILOSEC) 20 MG capsule Take 1 capsule (20 mg total) by mouth daily.   potassium chloride (KLOR-CON) 10 MEQ tablet Take 1 tablet (10 mEq total) by mouth daily.   rosuvastatin (CRESTOR) 10 MG tablet Take 1 tablet (10 mg total) by mouth daily.   sacubitril-valsartan (ENTRESTO) 49-51 MG Take 1 tablet by mouth 2 (two) times daily.   spironolactone (ALDACTONE) 25 MG tablet Take 1 tablet (25 mg total) by mouth daily.   No facility-administered encounter medications on file as of 10/01/2023.    Surgical History: Past Surgical History:  Procedure Laterality Date   LEFT HEART CATH AND CORONARY ANGIOGRAPHY Left 04/12/2018   Procedure: LEFT HEART CATH AND CORONARY ANGIOGRAPHY;  Surgeon: Antonieta Iba, MD;  Location: [facility] INVASIVE CV LAB;  Service: Cardiovascular;  Laterality: Left;    Medical History: Past Medical History:  Diagnosis Date   Anxiety    Cataract    Chronic HFrEF (heart failure with reduced ejection fraction) (HCC)    a. 02/2018 Echo: EF 25-30%; b. 07/2018 Echo: EF 35-40%; c. 11/2020 Echo: EF 45-50%, diast dysfxn, nl RV fxn. Trace MR/TR.   COPD (chronic obstructive pulmonary disease) (HCC)    Diabetes mellitus without complication (HCC)    Essential hypertension 05/30/2023   NICM (nonischemic cardiomyopathy) (HCC)    a. 02/2018 Echo: EF 25-30%; b. 07/2018 Echo: EF 35-40%; c. 11/2020 Echo: EF 45-50%, diast dysfxn.   Nonobstructive CAD (coronary artery disease)    a. 03/2018 Cath: LM nl, LAD mild diff dzs prox, heavy Ca2+, 60d, LCX nl/large, OM1 50, RCA nl. EF <20%.   Sleep apnea     Family History: Family History  Problem Relation Age of Onset   Heart disease Mother    Lung cancer Father    Heart disease Maternal Grandmother    Heart Problems Maternal Grandmother    Heart Problems Brother    Heart Problems Maternal Grandfather     Social History   Socioeconomic History   Marital status: Single    Spouse name: Not on file   Number of children: Not on file   Years of  education: Not on file   Highest education level: Not on file  Occupational History   Not on file  Tobacco Use   Smoking status: Former    Current packs/day: 1.50    Average packs/day: 1.5 packs/day for 40.0 years (60.0 ttl pk-yrs)    Types: Cigarettes   Smokeless tobacco: Never   Tobacco comments:    quit 2008  Vaping Use   Vaping status: Never Used  Substance and Sexual Activity   Alcohol use: Yes    Alcohol/week: 1.0 standard drink of alcohol    Types: 1 Shots of liquor per week    Comment: occasional drink   Drug use: Never   Sexual activity: Not on file  Other Topics  Concern   Not on file  Social History Narrative   Not on file   Social Determinants of Health   Financial Resource Strain: Not on file  Food Insecurity: Not on file  Transportation Needs: Not on file  Physical Activity: Not on file  Stress: Not on file  Social Connections: Not on file  Intimate Partner Violence: Not on file      Review of Systems  Constitutional:  Positive for fatigue. Negative for chills and unexpected weight change.  HENT: Negative.  Negative for congestion, rhinorrhea, sneezing and sore throat.   Eyes:  Negative for redness.  Respiratory:  Positive for cough (more than 1 week with yellow green sputum), chest tightness (more than 1 week) and wheezing (more than 1 week). Negative for shortness of breath.   Cardiovascular: Negative.  Negative for chest pain and palpitations.  Gastrointestinal:  Positive for diarrhea (occ). Negative for abdominal pain, constipation, nausea and vomiting.  Endocrine: Negative for polydipsia, polyphagia and polyuria.  Genitourinary:  Negative for dysuria and frequency.  Musculoskeletal:  Negative for arthralgias, back pain, joint swelling and neck pain.  Skin:  Negative for rash.  Neurological: Negative.  Negative for tremors and numbness.  Hematological:  Negative for adenopathy. Does not bruise/bleed easily.  Psychiatric/Behavioral:  Negative for  behavioral problems (Depression), self-injury, sleep disturbance and suicidal ideas. The patient is not nervous/anxious.     Vital Signs: BP 130/86   Pulse 88   Temp 98.4 F (36.9 C)   Resp 16   Ht 5\' 8"  (1.727 m)   Wt 235 lb 3.2 oz (106.7 kg)   SpO2 95%   BMI 35.76 kg/m    Physical Exam Vitals reviewed.  Constitutional:      General: He is not in acute distress.    Appearance: Normal appearance. He is obese. He is not ill-appearing.  HENT:     Head: Normocephalic and atraumatic.  Eyes:     Pupils: Pupils are equal, round, and reactive to light.  Cardiovascular:     Rate and Rhythm: Normal rate and regular rhythm.  Pulmonary:     Effort: Pulmonary effort is normal. No accessory muscle usage or respiratory distress.     Breath sounds: Normal air entry. Examination of the right-upper field reveals wheezing and rales. Examination of the left-upper field reveals wheezing and rales. Wheezing and rales present.  Neurological:     Mental Status: He is alert and oriented to person, place, and time.  Psychiatric:        Mood and Affect: Mood normal.        Behavior: Behavior normal.        Assessment/Plan: 1. Type 2 diabetes mellitus with diabetic cataract, without long-term current use of insulin (HCC) A1c still continues to climb, patient instructed to check fasting sugar once daily and write them down in a log/record. And being this record to his next visit on 11/08/23 to review and discuss his glucose levels. Also discussed diet changes including limiting high carb and high sugar foods. Will readdress and evaluate progress at his upcoming office visit.  Was seen by [facility] ophthalmology for eye exam but they were not able to fully evaluate his eyes per patient report. Has not seen an ophthalmologist in a long time. Referred to [facility] in [place], [place].  Dose of trulicity increased to 4.5 mg weekly. He has a lot of 1.5 mg and 3 mg dose at home right now, will take one of  each weekly  to equal 4.5 mg weekly and then will change with the manufacturer at then end of the year with the renewal of the patient assistance application.  - POCT glycosylated hemoglobin (Hb A1C) - Ambulatory referral to Ophthalmology  2. Acute bacterial bronchitis Antibiotic prescribed, take until gone.  - doxycycline (VIBRA-TABS) 100 MG tablet; Take 1 tablet (100 mg total) by mouth 2 (two) times daily for 10 days. Take with food  Dispense: 20 tablet; Refill: 0  3. Essential hypertension Controlled with current medications   4. Morbid obesity due to excess calories (HCC) Will start working on diet, encouraged to limit high carb, high sugar foods. Patient declined information or further assistance when offered for now and states that he wants to work on this himself. Will discuss at upcoming office visit on 11/08/23.    General Counseling: abir camden understanding of the findings of todays visit and agrees with plan of treatment. I have discussed any further diagnostic evaluation that may be needed or ordered today. We also reviewed his medications today. he has been encouraged to call the office with any questions or concerns that should arise related to todays visit.    Orders Placed This Encounter  Procedures   Ambulatory referral to Ophthalmology   POCT glycosylated hemoglobin (Hb A1C)    Meds ordered this encounter  Medications   doxycycline (VIBRA-TABS) 100 MG tablet    Sig: Take 1 tablet (100 mg total) by mouth 2 (two) times daily for 10 days. Take with food    Dispense:  20 tablet    Refill:  0    Return for previously scheduled AWV on 12/19, please bring glucose log to appt.   Total time spent:30 Minutes Time spent includes review of chart, medications, test results, and follow up plan with the patient.   [place] Controlled Substance Database was reviewed by me.  This patient was seen by Sallyanne Kuster, FNP-C in collaboration with Dr. Beverely Risen as a part of  collaborative care agreement.   Shaquinta Peruski R. Tedd Sias, MSN, FNP-C Internal medicine

## 2023-10-01 NOTE — Telephone Encounter (Signed)
Ophthalmology referral faxed to [facility] ; [phone] Notified patient. Gave pt telephone # [phone]

## 2023-10-31 ENCOUNTER — Ambulatory Visit (INDEPENDENT_AMBULATORY_CARE_PROVIDER_SITE_OTHER): Payer: Medicare Other | Admitting: Internal Medicine

## 2023-10-31 DIAGNOSIS — J4489 Other specified chronic obstructive pulmonary disease: Secondary | ICD-10-CM | POA: Diagnosis not present

## 2023-11-08 ENCOUNTER — Encounter: Payer: Self-pay | Admitting: Nurse Practitioner

## 2023-11-08 ENCOUNTER — Ambulatory Visit (INDEPENDENT_AMBULATORY_CARE_PROVIDER_SITE_OTHER): Payer: Medicare Other | Admitting: Nurse Practitioner

## 2023-11-08 VITALS — BP 122/78 | HR 80 | Temp 98.5°F | Resp 16 | Ht 68.0 in | Wt 229.4 lb

## 2023-11-08 DIAGNOSIS — E1159 Type 2 diabetes mellitus with other circulatory complications: Secondary | ICD-10-CM

## 2023-11-08 DIAGNOSIS — I5022 Chronic systolic (congestive) heart failure: Secondary | ICD-10-CM

## 2023-11-08 DIAGNOSIS — Z0000 Encounter for general adult medical examination without abnormal findings: Secondary | ICD-10-CM

## 2023-11-08 DIAGNOSIS — E1136 Type 2 diabetes mellitus with diabetic cataract: Secondary | ICD-10-CM | POA: Diagnosis not present

## 2023-11-08 DIAGNOSIS — I152 Hypertension secondary to endocrine disorders: Secondary | ICD-10-CM

## 2023-11-08 DIAGNOSIS — K219 Gastro-esophageal reflux disease without esophagitis: Secondary | ICD-10-CM | POA: Diagnosis not present

## 2023-11-08 NOTE — Progress Notes (Signed)
[facility] [address] [place], [place] [address]  Internal MEDICINE  Office Visit Note  Patient Name: Joe Berry  [id number]  [id number]  Date of Service: 11/08/2023  Chief Complaint  Patient presents with   Diabetes   Hypertension   Medicare Wellness    HPI Harlie presents for an annual well visit and physical exam.  Well-appearing 67 y.o. male with diabetes, hypertension, high cholesterol, heart failure and GERD  CRC screening: cologuard done in January, was negative, due again in January 2027.  Eye exam: referred to ophthalmology at his last visit.  foot exam: done today.  Labs: due for routine labs  New or worsening pain: none   blood pressures at home have been stable per his log from home Glucose fluctuates greatly between mid 100s to high 200s. Last A1c was elevated at recent prior visit      11/08/2023    8:38 AM 11/02/2022   11:13 AM 10/31/2021    8:32 AM  MMSE - Mini Mental State Exam  Orientation to time 5 5 5   Orientation to Place 5 5 5   Registration 3 3 3   Attention/ Calculation 5 5 5   Recall 3 3 3   Language- name 2 objects 2 2 2   Language- repeat 1 1 1   Language- follow 3 step command 3 3 3   Language- read & follow direction 1 1 1   Write a sentence 1 1 1   Copy design 1 1 1   Total score 30 30 30      Medicare Risk at Home - 11/08/23 0920     Any stairs in or around the home? Yes    If so, are there any without handrails? Yes    Home free of loose throw rugs in walkways, pet beds, electrical cords, etc? Yes    Adequate lighting in your home to reduce risk of falls? Yes    Life alert? No    Use of a cane, walker or w/c? No    Grab bars in the bathroom? No    Shower chair or bench in shower? No    Elevated toilet seat or a handicapped toilet? No              Functional Status Survey: Is the patient deaf or have difficulty hearing?: No Does the patient have difficulty seeing, even when wearing glasses/contacts?: No Does the  patient have difficulty concentrating, remembering, or making decisions?: No Does the patient have difficulty walking or climbing stairs?: No Does the patient have difficulty dressing or bathing?: No Does the patient have difficulty doing errands alone such as visiting a doctor's office or shopping?: No     05/02/2022    8:40 AM 08/01/2022    8:40 AM 11/02/2022   11:11 AM 02/08/2023   10:29 AM 11/08/2023    8:37 AM  Fall Risk  Falls in the past year? 0 0 0 0 0  Was there an injury with Fall?  0 0 0 0  Fall Risk Category Calculator  0 0 0 0  Fall Risk Category (Retired)  Low Low    (RETIRED) Patient Fall Risk Level  Low fall risk Low fall risk    Patient at Risk for Falls Due to  No Fall Risks No Fall Risks No Fall Risks No Fall Risks  Fall risk Follow up  Falls evaluation completed Falls evaluation completed Falls evaluation completed Falls evaluation completed       11/08/2023    8:37 AM  Depression  screen PHQ 2/9  Decreased Interest 0  Down, Depressed, Hopeless 0  PHQ - 2 Score 0       Current Medication: Outpatient Encounter Medications as of 11/08/2023  Medication Sig   ACCU-CHEK FASTCLIX LANCETS MISC Use to test blood sugars twice daily e11.65   aspirin EC 81 MG tablet Take 1 tablet (81 mg total) by mouth daily.   diphenhydrAMINE HCl 50 MG/30ML LIQD Take 30 mLs by mouth at bedtime as needed (sleep).   Dulaglutide (TRULICITY) 3 MG/0.5ML SOAJ INJECT 3 MG (0.5 ML) UNDER THE SKIN ONCE A WEEK   furosemide (LASIX) 40 MG tablet Take 1 tablet (40 mg total) by mouth daily.   glipiZIDE (GLUCOTROL XL) 5 MG 24 hr tablet Take 1 tablet (5 mg total) by mouth daily with breakfast.   metFORMIN (GLUCOPHAGE-XR) 750 MG 24 hr tablet TAKE 2 TABLETS DAILY WITH BREAKFAST (DISCONTINUE ALL PREVIOUS PRESCRIPTIONS FOR METFORMIN)   metoprolol succinate (TOPROL-XL) 50 MG 24 hr tablet Take 1 tablet (50 mg total) by mouth daily.   omeprazole (PRILOSEC) 20 MG capsule Take 1 capsule (20 mg total) by  mouth daily.   potassium chloride (KLOR-CON) 10 MEQ tablet Take 1 tablet (10 mEq total) by mouth daily.   rosuvastatin (CRESTOR) 10 MG tablet Take 1 tablet (10 mg total) by mouth daily.   sacubitril-valsartan (ENTRESTO) 49-51 MG Take 1 tablet by mouth 2 (two) times daily.   spironolactone (ALDACTONE) 25 MG tablet Take 1 tablet (25 mg total) by mouth daily.   No facility-administered encounter medications on file as of 11/08/2023.    Surgical History: Past Surgical History:  Procedure Laterality Date   LEFT HEART CATH AND CORONARY ANGIOGRAPHY Left 04/12/2018   Procedure: LEFT HEART CATH AND CORONARY ANGIOGRAPHY;  Surgeon: Antonieta Iba, MD;  Location: [facility] INVASIVE CV LAB;  Service: Cardiovascular;  Laterality: Left;    Medical History: Past Medical History:  Diagnosis Date   Anxiety    Cataract    Chronic HFrEF (heart failure with reduced ejection fraction) (HCC)    a. 02/2018 Echo: EF 25-30%; b. 07/2018 Echo: EF 35-40%; c. 11/2020 Echo: EF 45-50%, diast dysfxn, nl RV fxn. Trace MR/TR.   COPD (chronic obstructive pulmonary disease) (HCC)    Diabetes mellitus without complication (HCC)    Essential hypertension 05/30/2023   NICM (nonischemic cardiomyopathy) (HCC)    a. 02/2018 Echo: EF 25-30%; b. 07/2018 Echo: EF 35-40%; c. 11/2020 Echo: EF 45-50%, diast dysfxn.   Nonobstructive CAD (coronary artery disease)    a. 03/2018 Cath: LM nl, LAD mild diff dzs prox, heavy Ca2+, 60d, LCX nl/large, OM1 50, RCA nl. EF <20%.   Sleep apnea     Family History: Family History  Problem Relation Age of Onset   Heart disease Mother    Lung cancer Father    Heart disease Maternal Grandmother    Heart Problems Maternal Grandmother    Heart Problems Brother    Heart Problems Maternal Grandfather     Social History   Socioeconomic History   Marital status: Single    Spouse name: Not on file   Number of children: Not on file   Years of education: Not on file   Highest education level: Not on  file  Occupational History   Not on file  Tobacco Use   Smoking status: Former    Current packs/day: 1.50    Average packs/day: 1.5 packs/day for 40.0 years (60.0 ttl pk-yrs)    Types: Cigarettes   Smokeless tobacco:  Never   Tobacco comments:    quit 2008  Vaping Use   Vaping status: Never Used  Substance and Sexual Activity   Alcohol use: Yes    Alcohol/week: 1.0 standard drink of alcohol    Types: 1 Shots of liquor per week    Comment: occasional drink   Drug use: Never   Sexual activity: Not on file  Other Topics Concern   Not on file  Social History Narrative   Not on file   Social Drivers of Health   Financial Resource Strain: Not on file  Food Insecurity: Not on file  Transportation Needs: Not on file  Physical Activity: Not on file  Stress: Not on file  Social Connections: Not on file  Intimate Partner Violence: Not on file      Review of Systems  Constitutional:  Negative for activity change, appetite change, chills, fatigue, fever and unexpected weight change.  HENT: Negative.  Negative for congestion, ear pain, rhinorrhea, sore throat and trouble swallowing.   Eyes: Negative.   Respiratory: Negative.  Negative for cough, chest tightness, shortness of breath and wheezing.   Cardiovascular: Negative.  Negative for chest pain.  Gastrointestinal: Negative.  Negative for abdominal pain, blood in stool, constipation, diarrhea, nausea and vomiting.  Endocrine: Negative.   Genitourinary: Negative.  Negative for difficulty urinating, dysuria, frequency, hematuria and urgency.  Musculoskeletal: Negative.  Negative for arthralgias, back pain, joint swelling, myalgias and neck pain.  Skin: Negative.  Negative for rash and wound.  Allergic/Immunologic: Negative.  Negative for immunocompromised state.  Neurological: Negative.  Negative for dizziness, seizures, numbness and headaches.  Hematological: Negative.   Psychiatric/Behavioral: Negative.  Negative for behavioral  problems, self-injury and suicidal ideas. The patient is not nervous/anxious.     Vital Signs: BP 122/78   Pulse 80   Temp 98.5 F (36.9 C)   Resp 16   Ht 5\' 8"  (1.727 m)   Wt 229 lb 6.4 oz (104.1 kg)   SpO2 97%   BMI 34.88 kg/m    Physical Exam Vitals reviewed.  Constitutional:      General: He is awake. He is not in acute distress.    Appearance: Normal appearance. He is well-developed and well-groomed. He is obese. He is not ill-appearing.  HENT:     Head: Normocephalic and atraumatic.     Mouth/Throat:     Lips: Pink.     Pharynx: Uvula midline.  Eyes:     General: Lids are normal. Vision grossly intact. Gaze aligned appropriately. No scleral icterus.       Right eye: No discharge.        Left eye: No discharge.     Extraocular Movements: Extraocular movements intact.     Conjunctiva/sclera: Conjunctivae normal.     Pupils: Pupils are equal, round, and reactive to light.     Funduscopic exam:    Right eye: Red reflex present.        Left eye: Red reflex present. Neck:     Thyroid: No thyromegaly.     Vascular: No JVD.     Trachea: Trachea and phonation normal. No tracheal deviation.  Cardiovascular:     Rate and Rhythm: Normal rate and regular rhythm.     Pulses: Normal pulses.          Dorsalis pedis pulses are 2+ on the right side and 2+ on the left side.       Posterior tibial pulses are 2+ on the right side and  2+ on the left side.     Heart sounds: Normal heart sounds, S1 normal and S2 normal. No murmur heard.    No friction rub. No gallop.  Pulmonary:     Effort: Pulmonary effort is normal. No accessory muscle usage or respiratory distress.     Breath sounds: Normal breath sounds and air entry. No stridor. No wheezing or rales.  Chest:     Chest wall: No tenderness.  Abdominal:     Palpations: There is no shifting dullness, fluid wave or pulsatile mass.  Musculoskeletal:     Right foot: Normal range of motion. No deformity, bunion, Charcot foot, foot  drop or prominent metatarsal heads.     Left foot: Normal range of motion. No deformity, bunion, Charcot foot, foot drop or prominent metatarsal heads.  Feet:     Right foot:     Protective Sensation: 6 sites tested.  6 sites sensed.     Skin integrity: Callus and dry skin present. No ulcer, blister, skin breakdown, erythema, warmth or fissure.     Toenail Condition: Right toenails are abnormally thick and long.     Left foot:     Protective Sensation: 6 sites tested.  6 sites sensed.     Skin integrity: Callus and dry skin present. No ulcer, blister, skin breakdown, erythema, warmth or fissure.     Toenail Condition: Left toenails are abnormally thick and long.  Skin:    General: Skin is warm and dry.     Capillary Refill: Capillary refill takes less than 2 seconds.  Neurological:     Mental Status: He is alert and oriented to person, place, and time.     Cranial Nerves: No cranial nerve deficit.     Motor: No abnormal muscle tone.     Coordination: Coordination normal.     Gait: Gait normal.     Deep Tendon Reflexes: Reflexes are normal and symmetric.  Psychiatric:        Mood and Affect: Mood normal.        Behavior: Behavior normal. Behavior is cooperative.        Assessment/Plan: 1. Encounter for subsequent annual wellness visit (AWV) in Medicare patient (Primary) Age-appropriate preventive screenings and vaccinations discussed. Routine labs for health maintenance will be ordered. PHM updated.    2. Type 2 diabetes mellitus with diabetic cataract, without long-term current use of insulin (HCC) Urine sent for microalbumin/creatinine ratio.  - Urine Microalbumin w/creat. ratio  3. Hypertension associated with diabetes (HCC) Stable, continue entresto, furosemide, metoprolol and spironolactone as prescribed.   4. Chronic HFrEF (heart failure with reduced ejection fraction) (HCC) Follow up with cardiology.   5. Gastroesophageal reflux disease without esophagitis Continue  omeprazole as prescribed.   6. Morbid obesity due to excess calories (HCC) Continue diet and lifestyle modifications as discussed.       General Counseling: pasco denaro understanding of the findings of todays visit and agrees with plan of treatment. I have discussed any further diagnostic evaluation that may be needed or ordered today. We also reviewed his medications today. he has been encouraged to call the office with any questions or concerns that should arise related to todays visit.    Orders Placed This Encounter  Procedures   Urine Microalbumin w/creat. ratio    No orders of the defined types were placed in this encounter.   Return in about 3 months (around 02/06/2024) for F/U, Charels Stambaugh PCP.   Total time spent:30 Minutes Time spent includes review  of chart, medications, test results, and follow up plan with the patient.   [place] Controlled Substance Database was reviewed by me.  This patient was seen by Sallyanne Kuster, FNP-C in collaboration with Dr. Beverely Risen as a part of collaborative care agreement.  Umair Rosiles R. Tedd Sias, MSN, FNP-C Internal medicine

## 2023-11-09 ENCOUNTER — Other Ambulatory Visit: Payer: Self-pay

## 2023-11-09 ENCOUNTER — Telehealth: Payer: Self-pay | Admitting: Cardiovascular Disease

## 2023-11-09 LAB — MICROALBUMIN / CREATININE URINE RATIO
Creatinine, Urine: 21.4 mg/dL
Microalb/Creat Ratio: 96 mg/g{creat} — ABNORMAL HIGH (ref 0–29)
Microalbumin, Urine: 20.6 ug/mL

## 2023-11-09 MED ORDER — ENTRESTO 49-51 MG PO TABS: 1.0000 | ORAL_TABLET | Freq: Two times a day (BID) | ORAL | 3 refills | Status: DC

## 2023-11-09 NOTE — Telephone Encounter (Signed)
*  STAT* If patient is at the pharmacy, call can be transferred to refill team.   1. Which medications need to be refilled? (please list name of each medication and dose if known)  new prescription for   Entresto  2. Would you like to learn more about the convenience, safety, & potential cost savings by using the [facility]?     3. Are you open to using the [facility] (Type [facility]. ).   4. Which pharmacy/location (including street and city if local pharmacy) is medication to be sent to?[facility]   5. Do they need a 30 day or 90 # 180 day supply? 90 days*- please call in today

## 2023-11-11 NOTE — Procedures (Signed)
[facility] [address] [place], [address]    Complete Pulmonary Function Testing Interpretation:  FINDINGS:  Forced vital capacity is moderately decreased.  FEV1 is 1.32 L which is 43% of predicted and is severely decreased.  Postbronchodilator no significant changes noted in the FEV1.  Total lung capacity is mildly decreased.  Residual volume is increased.  FRC is normal.  Residual volume total lung past ratio is increased.  DLCO is mildly decreased.  IMPRESSION:  This pulmonary function study is consistent with severe obstructive lung disease clinical correlation is recommended  Yevonne Pax, MD [facility] Pulmonary Critical Care Medicine Sleep Medicine

## 2023-11-12 MED ORDER — ENTRESTO 49-51 MG PO TABS: 1.0000 | ORAL_TABLET | Freq: Two times a day (BID) | ORAL | 0 refills | Status: DC

## 2023-11-12 NOTE — Telephone Encounter (Signed)
Requested Prescriptions   Signed Prescriptions Disp Refills   sacubitril-valsartan (ENTRESTO) 49-51 MG 180 tablet 0    Sig: Take 1 tablet by mouth 2 (two) times daily.    Authorizing Provider: Antonieta Iba    Ordering User: Kendrick Fries

## 2023-11-15 ENCOUNTER — Other Ambulatory Visit: Payer: Self-pay | Admitting: Nurse Practitioner

## 2023-11-15 ENCOUNTER — Telehealth: Payer: Self-pay | Admitting: Cardiovascular Disease

## 2023-11-15 MED ORDER — ENTRESTO 49-51 MG PO TABS: 1.0000 | ORAL_TABLET | Freq: Two times a day (BID) | ORAL | 0 refills | Status: DC

## 2023-11-15 NOTE — Telephone Encounter (Signed)
*  STAT* If patient is at the pharmacy, call can be transferred to refill team.   1. Which medications need to be refilled? (please list name of each medication and dose if known) sacubitril-valsartan (ENTRESTO) 49-51 MG    2. Would you like to learn more about the convenience, safety, & potential cost savings by using the [facility]?      3. Are you open to using the [facility] (Type [facility].  ).   4. Which pharmacy/location (including street and city if local pharmacy) is medication to be sent to? [facility] [place] [place] - [facility]    5. Do they need a 30 day or 90 day supply? 90

## 2023-11-15 NOTE — Telephone Encounter (Signed)
Requested Prescriptions   Signed Prescriptions Disp Refills   sacubitril-valsartan (ENTRESTO) 49-51 MG 180 tablet 0    Sig: Take 1 tablet by mouth 2 (two) times daily.    Authorizing Provider: Antonieta Iba    Ordering User: Kendrick Fries

## 2023-11-16 ENCOUNTER — Telehealth: Payer: Self-pay | Admitting: Cardiovascular Disease

## 2023-11-16 LAB — LIPID PANEL
Chol/HDL Ratio: 2.8 {ratio} (ref 0.0–5.0)
Cholesterol, Total: 94 mg/dL — ABNORMAL LOW (ref 100–199)
HDL: 34 mg/dL — ABNORMAL LOW (ref >39)
LDL Chol Calc (NIH): 41 mg/dL (ref 0–99)
Triglycerides: 98 mg/dL (ref 0–149)
VLDL Cholesterol Cal: 19 mg/dL (ref 5–40)

## 2023-11-16 LAB — CBC WITH DIFFERENTIAL/PLATELET
Basophils Absolute: 0.1 10*3/uL (ref 0.0–0.2)
Basos: 1 %
EOS (ABSOLUTE): 0.4 10*3/uL (ref 0.0–0.4)
Eos: 3 %
Hematocrit: 43.9 % (ref 37.5–51.0)
Hemoglobin: 13.7 g/dL (ref 13.0–17.7)
Immature Grans (Abs): 0 10*3/uL (ref 0.0–0.1)
Immature Granulocytes: 0 %
Lymphocytes Absolute: 1.5 10*3/uL (ref 0.7–3.1)
Lymphs: 13 %
MCH: 27.8 pg (ref 26.6–33.0)
MCHC: 31.2 g/dL — ABNORMAL LOW (ref 31.5–35.7)
MCV: 89 fL (ref 79–97)
Monocytes Absolute: 1.1 10*3/uL — ABNORMAL HIGH (ref 0.1–0.9)
Monocytes: 10 %
Neutrophils Absolute: 8.4 10*3/uL — ABNORMAL HIGH (ref 1.4–7.0)
Neutrophils: 73 %
Platelets: 327 10*3/uL (ref 150–450)
RBC: 4.92 x10E6/uL (ref 4.14–5.80)
RDW: 12.8 % (ref 11.6–15.4)
WBC: 11.5 10*3/uL — ABNORMAL HIGH (ref 3.4–10.8)

## 2023-11-16 LAB — COMPREHENSIVE METABOLIC PANEL
ALT: 15 [IU]/L (ref 0–44)
AST: 13 [IU]/L (ref 0–40)
Albumin: 4 g/dL (ref 3.9–4.9)
Alkaline Phosphatase: 44 [IU]/L (ref 44–121)
BUN/Creatinine Ratio: 18 (ref 10–24)
BUN: 18 mg/dL (ref 8–27)
Bilirubin Total: 0.6 mg/dL (ref 0.0–1.2)
CO2: 27 mmol/L (ref 20–29)
Calcium: 9.6 mg/dL (ref 8.6–10.2)
Chloride: 96 mmol/L (ref 96–106)
Creatinine, Ser: 1.01 mg/dL (ref 0.76–1.27)
Globulin, Total: 3 g/dL (ref 1.5–4.5)
Glucose: 165 mg/dL — ABNORMAL HIGH (ref 70–99)
Potassium: 4.8 mmol/L (ref 3.5–5.2)
Sodium: 138 mmol/L (ref 134–144)
Total Protein: 7 g/dL (ref 6.0–8.5)
eGFR: 81 mL/min/{1.73_m2} (ref >59)

## 2023-11-16 LAB — PSA: Prostate Specific Ag, Serum: 0.9 ng/mL (ref 0.0–4.0)

## 2023-11-16 LAB — HGB A1C W/O EAG: Hgb A1c MFr Bld: 9.4 % — ABNORMAL HIGH (ref 4.8–5.6)

## 2023-11-16 LAB — VITAMIN D 25 HYDROXY (VIT D DEFICIENCY, FRACTURES): Vit D, 25-Hydroxy: 36.4 ng/mL (ref 30.0–100.0)

## 2023-11-16 NOTE — Telephone Encounter (Signed)
Spoke to staff at [facility] Phone number: [phone] or [phone] option 3 to give a verbal order of patient's sacubitril-valsartan (ENTRESTO) 49-51 MG tablet. Staff stated that they would process it and reach out to the patient.

## 2023-11-16 NOTE — Telephone Encounter (Signed)
Pt called in stating he spoke to pharmacy and they have not receive rx. He stated its through [facility]. Pt asked if its being sent the right place. He provided numbers below   sacubitril-valsartan (ENTRESTO) 49-51 MG    [facility]  Phone number: [phone] or [phone] option 3

## 2023-12-17 NOTE — Progress Notes (Unsigned)
Cardiology Office Note  Date:  12/18/2023   ID:  Joe Berry, DOB 02/22/1956, MRN [id number]  PCP:  Sallyanne Kuster, NP   Chief Complaint  Patient presents with   12 month follow up     Patient needs a cardiac clearance for cataract surgery; right eye scheduled for March 27 & left eye April 10 with Dr. Fredderick Phenix @ [facility].  "Doing well."     HPI:   [name] is a 68 year old gentleman with past medical history of Smoking, quit 2008 Anxiety Emphysema/COPD OSA, started CPAP  summer 2019 Shortness of breath, Ejection fraction 25-30%  april 2019  35 to 40% in 07/2018 Cardiac cath 04/12/2018  Nonobstructive disease with reduced LV function 45 to 50% in 2022 Three-vessel coronary calcification on CT scan Who presents for follow-up of his dilated nonischemic cardiomyopathy  Last seen by myself in clinic January 2024  Cataract surgery March and April with Dr. 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 a lady friend,  No regular exercise program  Struggling with his diabetes, A1c trending higher over the past 2 years Was drinking more sweet tea and eating more desserts Recently working on diet, trying to do better Cut back on sugar, sugar still not coming down is much as he would like  In the past has been into metal detection as a hobby No leg swelling, no PND orthopnea  on Lasix 40 daily History of sleep apnea on CPAP  Lab work reviewed Total cholesterol 94 LDL 41 Glucose 204, creatinine 0.92 BUN 24 Hemoglobin 14.8 A1c 9.4 down from 9.8  EKG personally reviewed by myself on todays visit EKG Interpretation Date/Time:  Tuesday December 18 2023 08:54:08 EST Ventricular Rate:  84 PR Interval:  146 QRS Duration:  92 QT Interval:  354 QTC Calculation: 418 R Axis:   34  Text Interpretation: Normal sinus rhythm Normal ECG No previous ECGs available Confirmed by Julien Nordmann [phone]) on 12/18/2023 9:04:01 AM   Other past  medical history reviewed right Heart cath (very difficult procedure as unable to measure pressures in the PA and wedge) RA pressures: 25 mm Hg RV pressures: 77/17/mean of 25 LVEDP: 32 mm Hg  CT scan images showing significant coronary calcification all 3 vessels  Echocardiogram  severely depressed LV function moderately dilated LV ejection fraction 25-30% dated 03/19/2018   history of sleep apnea and he is non compliant with CPAP therapy.    PMH:   has a past medical history of Anxiety, Cataract, Chronic HFrEF (heart failure with reduced ejection fraction) (HCC), COPD (chronic obstructive pulmonary disease) (HCC), Diabetes mellitus without complication (HCC), Essential hypertension (05/30/2023), NICM (nonischemic cardiomyopathy) (HCC), Nonobstructive CAD (coronary artery disease), and Sleep apnea.  PSH:    Past Surgical History:  Procedure Laterality Date   LEFT HEART CATH AND CORONARY ANGIOGRAPHY Left 04/12/2018   Procedure: LEFT HEART CATH AND CORONARY ANGIOGRAPHY;  Surgeon: Antonieta Iba, MD;  Location: [facility] INVASIVE CV LAB;  Service: Cardiovascular;  Laterality: Left;    Current Outpatient Medications  Medication Sig Dispense Refill   ACCU-CHEK FASTCLIX LANCETS MISC Use to test blood sugars twice daily e11.65 102 each 11   aspirin EC 81 MG tablet Take 1 tablet (81 mg total) by mouth daily. 90 tablet 3   diphenhydrAMINE HCl 50 MG/30ML LIQD Take 30 mLs by mouth at bedtime as needed (sleep).     Dulaglutide (TRULICITY) 3 MG/0.5ML SOAJ INJECT 3 MG (0.5  ML) UNDER THE SKIN ONCE A WEEK 6 mL 3   furosemide (LASIX) 40 MG tablet Take 1 tablet (40 mg total) by mouth daily. 90 tablet 3   glipiZIDE (GLUCOTROL XL) 5 MG 24 hr tablet Take 1 tablet (5 mg total) by mouth daily with breakfast. 90 tablet 1   metFORMIN (GLUCOPHAGE-XR) 750 MG 24 hr tablet TAKE 2 TABLETS DAILY WITH BREAKFAST (DISCONTINUE ALL PREVIOUS PRESCRIPTIONS FOR METFORMIN) 180 tablet 3   metoprolol succinate (TOPROL-XL) 50 MG  24 hr tablet Take 1 tablet (50 mg total) by mouth daily. 90 tablet 3   omeprazole (PRILOSEC) 20 MG capsule Take 1 capsule (20 mg total) by mouth daily. 90 capsule 3   potassium chloride (KLOR-CON) 10 MEQ tablet Take 1 tablet (10 mEq total) by mouth daily. 90 tablet 3   rosuvastatin (CRESTOR) 10 MG tablet Take 1 tablet (10 mg total) by mouth daily. 90 tablet 3   sacubitril-valsartan (ENTRESTO) 49-51 MG Take 1 tablet by mouth 2 (two) times daily. 180 tablet 0   spironolactone (ALDACTONE) 25 MG tablet Take 1 tablet (25 mg total) by mouth daily. 90 tablet 3   No current facility-administered medications for this visit.    Allergies:   Patient has no known allergies.   Social History:  The patient  reports that he has quit smoking. His smoking use included cigarettes. He has a 60 pack-year smoking history. He has never used smokeless tobacco. He reports current alcohol use of about 1.0 standard drink of alcohol per week. He reports that he does not use drugs.   Family History:   family history includes Heart Problems in his brother, maternal grandfather, and maternal grandmother; Heart disease in his maternal grandmother and mother; Lung cancer in his father.    Review of Systems  Constitutional: Negative.   HENT: Negative.    Respiratory: Negative.    Cardiovascular: Negative.   Gastrointestinal: Negative.   Musculoskeletal: Negative.   Neurological: Negative.   Psychiatric/Behavioral: Negative.    All other systems reviewed and are negative.   PHYSICAL EXAM: VS:  BP 120/76 (BP Location: Left Arm, Patient Position: Sitting, Cuff Size: Normal)   Pulse 84   Ht 5\' 8"  (1.727 m)   Wt 226 lb (102.5 kg)   SpO2 97%   BMI 34.36 kg/m  , BMI Body mass index is 34.36 kg/m. Constitutional:  oriented to person, place, and time. No distress.  HENT:  Head: Grossly normal Eyes:  no discharge. No scleral icterus.  Neck: No JVD, no carotid bruits  Cardiovascular: Regular rate and rhythm, no  murmurs appreciated Pulmonary/Chest: Clear to auscultation bilaterally, no wheezes or rails Abdominal: Soft.  no distension.  no tenderness.  Musculoskeletal: Normal range of motion Neurological:  normal muscle tone. Coordination normal. No atrophy Skin: Skin warm and dry Psychiatric: normal affect, pleasant  Recent Labs: 11/15/2023: ALT 15; BUN 18; Creatinine, Ser 1.01; Hemoglobin 13.7; Platelets 327; Potassium 4.8; Sodium 138    Lipid Panel Lab Results  Component Value Date   CHOL 94 (L) 11/15/2023   HDL 34 (L) 11/15/2023   LDLCALC 41 11/15/2023   TRIG 98 11/15/2023      Wt Readings from Last 3 Encounters:  12/18/23 226 lb (102.5 kg)  11/08/23 229 lb 6.4 oz (104.1 kg)  10/01/23 235 lb 3.2 oz (106.7 kg)     ASSESSMENT AND PLAN:  Dilated cardiomyopathy (HCC) -nonischemic Appears euvolemic Ejection fraction initially 20% April 2019 up to 45 to 50% in 2022 Continue Entresto, metoprolol,  Lasix daily with spironolactone On Trulicity  issues with Farxiga, GI upset.   Hyperlipidemia Cholesterol is at goal on the current lipid regimen. No changes to the medications were made.  Empyema lung (HCC) Prior CT scan showing COPD Prior smoking history,  Weight loss and walking program recommended  SOB (shortness of breath) Exercise program recommended, calorie restriction  Coronary artery calcification with stable angina CT scan with diffuse heavy calcification LAD, circumflex and RCA Nonobstructive coronary artery disease seen on cardiac catheterization Cholesterol at goal, stressed the importance of aggressive diabetes control  obesity in the setting of diabetes Managed by primary care He is hoping not to add more medication, recommend he consider lifestyle glucose monitor sensor   Orders Placed This Encounter  Procedures   EKG 12-Lead     Signed, Dossie Arbour, M.D., Ph.D. 12/18/2023  [facility] [place], [place] [phone]

## 2023-12-18 ENCOUNTER — Encounter: Payer: Self-pay | Admitting: Cardiovascular Disease

## 2023-12-18 ENCOUNTER — Ambulatory Visit: Payer: Medicare Other | Attending: Cardiovascular Disease | Admitting: Cardiovascular Disease

## 2023-12-18 VITALS — BP 120/76 | HR 84 | Ht 68.0 in | Wt 226.0 lb

## 2023-12-18 DIAGNOSIS — I428 Other cardiomyopathies: Secondary | ICD-10-CM | POA: Diagnosis not present

## 2023-12-18 DIAGNOSIS — J869 Pyothorax without fistula: Secondary | ICD-10-CM | POA: Diagnosis present

## 2023-12-18 DIAGNOSIS — Z794 Long term (current) use of insulin: Secondary | ICD-10-CM | POA: Diagnosis present

## 2023-12-18 DIAGNOSIS — E119 Type 2 diabetes mellitus without complications: Secondary | ICD-10-CM | POA: Insufficient documentation

## 2023-12-18 DIAGNOSIS — I10 Essential (primary) hypertension: Secondary | ICD-10-CM | POA: Insufficient documentation

## 2023-12-18 DIAGNOSIS — I272 Pulmonary hypertension, unspecified: Secondary | ICD-10-CM | POA: Diagnosis present

## 2023-12-18 DIAGNOSIS — I2510 Atherosclerotic heart disease of native coronary artery without angina pectoris: Secondary | ICD-10-CM | POA: Insufficient documentation

## 2023-12-18 DIAGNOSIS — I5022 Chronic systolic (congestive) heart failure: Secondary | ICD-10-CM | POA: Insufficient documentation

## 2023-12-18 DIAGNOSIS — E1165 Type 2 diabetes mellitus with hyperglycemia: Secondary | ICD-10-CM | POA: Insufficient documentation

## 2023-12-18 DIAGNOSIS — G4733 Obstructive sleep apnea (adult) (pediatric): Secondary | ICD-10-CM | POA: Diagnosis present

## 2023-12-18 MED ORDER — POTASSIUM CHLORIDE ER 10 MEQ PO TBCR: 10.0000 meq | EXTENDED_RELEASE_TABLET | Freq: Every day | ORAL | 3 refills | Status: DC

## 2023-12-18 MED ORDER — FUROSEMIDE 40 MG PO TABS: 40.0000 mg | ORAL_TABLET | Freq: Every day | ORAL | 3 refills | Status: DC

## 2023-12-18 MED ORDER — SPIRONOLACTONE 25 MG PO TABS: 25.0000 mg | ORAL_TABLET | Freq: Every day | ORAL | 3 refills | Status: DC

## 2023-12-18 MED ORDER — ROSUVASTATIN CALCIUM 10 MG PO TABS: 10.0000 mg | ORAL_TABLET | Freq: Every day | ORAL | 3 refills | Status: DC

## 2023-12-18 MED ORDER — METOPROLOL SUCCINATE ER 50 MG PO TB24: 50.0000 mg | ORAL_TABLET | Freq: Every day | ORAL | 3 refills | Status: DC

## 2023-12-18 NOTE — Patient Instructions (Addendum)
Medication Instructions:  Your physician recommends that you continue on your current medications as directed. Please refer to the Current Medication list given to you today.  If you need a refill on your cardiac medications before your next appointment, please call your pharmacy.   Lab work: No new labs needed  Testing/Procedures: No new testing needed  Follow-Up: At [facility], you and your health needs are our priority.  As part of our continuing mission to provide you with exceptional heart care, we have created designated Provider Care Teams.  These Care Teams include your primary Cardiologist (physician) and Advanced Practice Providers (APPs -  Physician Assistants and Nurse Practitioners) who all work together to provide you with the care you need, when you need it.  You will need a follow up appointment in 12 months  Providers on your designated Care Team:   Nicolasa Ducking, NP Eula Listen, PA-C Cadence Fransico Michael, [place]  COVID-19 Vaccine Information can be found at: [email or web address] For questions related to vaccine distribution or appointments, please email [email or web address] or call [phone].

## 2023-12-28 ENCOUNTER — Telehealth: Payer: Self-pay | Admitting: Cardiovascular Disease

## 2023-12-28 NOTE — Telephone Encounter (Signed)
   Pre-operative Risk Assessment    Patient Name: Joe Berry  DOB: October 23, 1956 MRN: [id number]   Date of last office visit: 12/18/23 Date of next office visit: n/a   Request for Surgical Clearance    Procedure:  Cataract extraction w/Intraocular Lens Implantation of the Right & Left Eye  Date of Surgery:  Clearance 02/14/24                                Surgeon:  Dr. Austin Socks Group or Practice Name:  [facility] Phone number:  [phone] Fax number:  [phone]   Type of Clearance Requested:   - Medical    Type of Anesthesia:   Topical Anesthesia w?IV medication   Additional requests/questions:    Signed, Arsenio Celine GAILS   12/28/2023, 9:22 AM

## 2023-12-28 NOTE — Telephone Encounter (Signed)
   Patient Name: Rafiq Bucklin  DOB: April 22, 1956 MRN: [id number]  Primary Cardiologist: Evalene Lunger, MD  Chart reviewed as part of pre-operative protocol coverage. Cataract extractions are recognized in guidelines as low risk surgeries that do not typically require specific preoperative testing or holding of blood thinner therapy. Therefore, given past medical history and time since last visit, based on ACC/AHA guidelines, Tarron Krolak would be at acceptable risk for the planned procedure without further cardiovascular testing.   I will route this recommendation to the requesting party via Epic fax function and remove from pre-op pool.  Please call with questions.  Josefa CHRISTELLA Beauvais, NP 12/28/2023, 2:48 PM

## 2024-01-11 ENCOUNTER — Other Ambulatory Visit: Payer: Self-pay

## 2024-01-11 DIAGNOSIS — E1165 Type 2 diabetes mellitus with hyperglycemia: Secondary | ICD-10-CM

## 2024-01-11 DIAGNOSIS — K219 Gastro-esophageal reflux disease without esophagitis: Secondary | ICD-10-CM

## 2024-01-11 MED ORDER — GLIPIZIDE ER 5 MG PO TB24: 5.0000 mg | ORAL_TABLET | Freq: Every day | ORAL | 1 refills | Status: DC

## 2024-01-11 MED ORDER — METFORMIN HCL ER 750 MG PO TB24: ORAL_TABLET | ORAL | 3 refills | Status: DC

## 2024-01-11 MED ORDER — OMEPRAZOLE 20 MG PO CPDR: 20.0000 mg | DELAYED_RELEASE_CAPSULE | Freq: Every day | ORAL | 3 refills | Status: DC

## 2024-02-07 ENCOUNTER — Ambulatory Visit (INDEPENDENT_AMBULATORY_CARE_PROVIDER_SITE_OTHER): Payer: Medicare Other | Admitting: Nurse Practitioner

## 2024-02-07 ENCOUNTER — Encounter: Payer: Self-pay | Admitting: Nurse Practitioner

## 2024-02-07 VITALS — BP 130/76 | HR 79 | Temp 98.3°F | Resp 16 | Ht 68.0 in | Wt 230.6 lb

## 2024-02-07 DIAGNOSIS — E1159 Type 2 diabetes mellitus with other circulatory complications: Secondary | ICD-10-CM | POA: Diagnosis not present

## 2024-02-07 DIAGNOSIS — E1169 Type 2 diabetes mellitus with other specified complication: Secondary | ICD-10-CM

## 2024-02-07 DIAGNOSIS — I152 Hypertension secondary to endocrine disorders: Secondary | ICD-10-CM | POA: Diagnosis not present

## 2024-02-07 DIAGNOSIS — E1136 Type 2 diabetes mellitus with diabetic cataract: Secondary | ICD-10-CM | POA: Diagnosis not present

## 2024-02-07 DIAGNOSIS — E785 Hyperlipidemia, unspecified: Secondary | ICD-10-CM

## 2024-02-07 NOTE — Progress Notes (Signed)
 [facility] [address] [place], [place] [address]  Internal MEDICINE  Office Visit Note  Patient Name: Joe Berry  [id number]  [id number]  Date of Service: 02/07/2024  Chief Complaint  Patient presents with   Diabetes   Hypertension   Follow-up    Diabetes Pertinent negatives for hypoglycemia include no nervousness/anxiousness or tremors. Associated symptoms include fatigue. Pertinent negatives for diabetes include no chest pain.  Hypertension Pertinent negatives include no chest pain, neck pain, palpitations or shortness of breath.   Joe Berry presents for a follow-up visit for diabetes, hypertension and high cholesterol.  Diabetes -- Hypertension --  High cholesterol --  Having cataract surgery next week.     Current Medication: Outpatient Encounter Medications as of 02/07/2024  Medication Sig   ACCU-CHEK FASTCLIX LANCETS MISC Use to test blood sugars twice daily e11.65   aspirin EC 81 MG tablet Take 1 tablet (81 mg total) by mouth daily.   diphenhydrAMINE HCl 50 MG/30ML LIQD Take 30 mLs by mouth at bedtime as needed (sleep).   Dulaglutide (TRULICITY) 3 MG/0.5ML SOAJ INJECT 3 MG (0.5 ML) UNDER THE SKIN ONCE A WEEK   furosemide (LASIX) 40 MG tablet Take 1 tablet (40 mg total) by mouth daily.   glipiZIDE (GLUCOTROL XL) 5 MG 24 hr tablet Take 1 tablet (5 mg total) by mouth daily with breakfast.   metFORMIN (GLUCOPHAGE-XR) 750 MG 24 hr tablet TAKE 2 TABLETS DAILY WITH BREAKFAST (DISCONTINUE ALL PREVIOUS PRESCRIPTIONS FOR METFORMIN)   metoprolol succinate (TOPROL-XL) 50 MG 24 hr tablet Take 1 tablet (50 mg total) by mouth daily.   omeprazole (PRILOSEC) 20 MG capsule Take 1 capsule (20 mg total) by mouth daily.   potassium chloride (KLOR-CON) 10 MEQ tablet Take 1 tablet (10 mEq total) by mouth daily.   rosuvastatin (CRESTOR) 10 MG tablet Take 1 tablet (10 mg total) by mouth daily.   sacubitril-valsartan (ENTRESTO) 49-51 MG Take 1 tablet by mouth 2 (two) times daily.    spironolactone (ALDACTONE) 25 MG tablet Take 1 tablet (25 mg total) by mouth daily.   No facility-administered encounter medications on file as of 02/07/2024.    Surgical History: Past Surgical History:  Procedure Laterality Date   LEFT HEART CATH AND CORONARY ANGIOGRAPHY Left 04/12/2018   Procedure: LEFT HEART CATH AND CORONARY ANGIOGRAPHY;  Surgeon: Antonieta Iba, MD;  Location: [facility] INVASIVE CV LAB;  Service: Cardiovascular;  Laterality: Left;    Medical History: Past Medical History:  Diagnosis Date   Anxiety    Cataract    Chronic HFrEF (heart failure with reduced ejection fraction) (HCC)    a. 02/2018 Echo: EF 25-30%; b. 07/2018 Echo: EF 35-40%; c. 11/2020 Echo: EF 45-50%, diast dysfxn, nl RV fxn. Trace MR/TR.   COPD (chronic obstructive pulmonary disease) (HCC)    Diabetes mellitus without complication (HCC)    Essential hypertension 05/30/2023   NICM (nonischemic cardiomyopathy) (HCC)    a. 02/2018 Echo: EF 25-30%; b. 07/2018 Echo: EF 35-40%; c. 11/2020 Echo: EF 45-50%, diast dysfxn.   Nonobstructive CAD (coronary artery disease)    a. 03/2018 Cath: LM nl, LAD mild diff dzs prox, heavy Ca2+, 60d, LCX nl/large, OM1 50, RCA nl. EF <20%.   Sleep apnea     Family History: Family History  Problem Relation Age of Onset   Heart disease Mother    Lung cancer Father    Heart disease Maternal Grandmother    Heart Problems Maternal Grandmother    Heart Problems Brother  Heart Problems Maternal Grandfather     Social History   Socioeconomic History   Marital status: Single    Spouse name: Not on file   Number of children: Not on file   Years of education: Not on file   Highest education level: Not on file  Occupational History   Not on file  Tobacco Use   Smoking status: Former    Current packs/day: 1.50    Average packs/day: 1.5 packs/day for 40.0 years (60.0 ttl pk-yrs)    Types: Cigarettes   Smokeless tobacco: Never   Tobacco comments:    quit 2008  Vaping Use    Vaping status: Never Used  Substance and Sexual Activity   Alcohol use: Yes    Alcohol/week: 1.0 standard drink of alcohol    Types: 1 Shots of liquor per week    Comment: occasional drink   Drug use: Never   Sexual activity: Not on file  Other Topics Concern   Not on file  Social History Narrative   Not on file   Social Drivers of Health   Financial Resource Strain: Not on file  Food Insecurity: Not on file  Transportation Needs: Not on file  Physical Activity: Not on file  Stress: Not on file  Social Connections: Not on file  Intimate Partner Violence: Not on file      Review of Systems  Constitutional:  Positive for fatigue. Negative for chills and unexpected weight change.  HENT:  Negative for congestion, rhinorrhea, sneezing and sore throat.   Eyes:  Negative for redness.  Respiratory: Negative.  Negative for cough, chest tightness, shortness of breath and wheezing.   Cardiovascular: Negative.  Negative for chest pain and palpitations.  Gastrointestinal:  Negative for abdominal pain, constipation, diarrhea, nausea and vomiting.  Genitourinary:  Negative for dysuria and frequency.  Musculoskeletal:  Negative for arthralgias, back pain, joint swelling and neck pain.  Skin:  Negative for rash.  Neurological: Negative.  Negative for tremors and numbness.  Hematological:  Negative for adenopathy. Does not bruise/bleed easily.  Psychiatric/Behavioral:  Negative for behavioral problems (Depression), sleep disturbance and suicidal ideas. The patient is not nervous/anxious.     Vital Signs: BP 130/76   Pulse 79   Temp 98.3 F (36.8 C)   Resp 16   Ht 5\' 8"  (1.727 m)   Wt 230 lb 9.6 oz (104.6 kg)   SpO2 95%   BMI 35.06 kg/m    Physical Exam Vitals reviewed.  Constitutional:      General: He is not in acute distress.    Appearance: Normal appearance. He is obese. He is not ill-appearing.  HENT:     Head: Normocephalic and atraumatic.  Eyes:     Pupils: Pupils  are equal, round, and reactive to light.  Cardiovascular:     Rate and Rhythm: Normal rate and regular rhythm.  Pulmonary:     Effort: Pulmonary effort is normal. No respiratory distress.  Neurological:     Mental Status: He is alert and oriented to person, place, and time.  Psychiatric:        Mood and Affect: Mood normal.        Behavior: Behavior normal.        Assessment/Plan: 1. Type 2 diabetes mellitus with diabetic cataract, without long-term current use of insulin (HCC) (Primary) *** - Hgb A1C w/o eAG  2. Hypertension associated with diabetes (HCC) ***  3. Hyperlipidemia associated with type 2 diabetes mellitus (HCC) ***   General  Counseling: Joe Berry understanding of the findings of todays visit and agrees with plan of treatment. I have discussed any further diagnostic evaluation that may be needed or ordered today. We also reviewed his medications today. he has been encouraged to call the office with any questions or concerns that should arise related to todays visit.    Orders Placed This Encounter  Procedures   Hgb A1C w/o eAG    No orders of the defined types were placed in this encounter.   Return in about 5 months (around 07/09/2024) for F/U, Recheck A1C, Joe Berry PCP.   Total time spent:*** Minutes Time spent includes review of chart, medications, test results, and follow up plan with the patient.   [place] Controlled Substance Database was reviewed by me.  This patient was seen by Joe Kuster, FNP-C in collaboration with Dr. Beverely Berry as a part of collaborative care agreement.   Joe Tena R. Tedd Sias, MSN, FNP-C Internal medicine

## 2024-02-08 ENCOUNTER — Encounter: Payer: Self-pay | Admitting: Nurse Practitioner

## 2024-02-21 ENCOUNTER — Ambulatory Visit: Payer: Medicare Other | Admitting: Internal Medicine

## 2024-02-22 LAB — HGB A1C W/O EAG: Hgb A1c MFr Bld: 8.7 % — ABNORMAL HIGH (ref 4.8–5.6)

## 2024-03-03 ENCOUNTER — Encounter: Payer: Self-pay | Admitting: Internal Medicine

## 2024-03-03 ENCOUNTER — Ambulatory Visit: Payer: Medicare Other | Admitting: Internal Medicine

## 2024-03-03 VITALS — BP 123/72 | HR 89 | Temp 98.2°F | Resp 16 | Ht 68.0 in | Wt 228.4 lb

## 2024-03-03 DIAGNOSIS — I5022 Chronic systolic (congestive) heart failure: Secondary | ICD-10-CM | POA: Diagnosis not present

## 2024-03-03 DIAGNOSIS — K219 Gastro-esophageal reflux disease without esophagitis: Secondary | ICD-10-CM

## 2024-03-03 DIAGNOSIS — J4489 Other specified chronic obstructive pulmonary disease: Secondary | ICD-10-CM | POA: Diagnosis not present

## 2024-03-03 MED ORDER — BREZTRI AEROSPHERE 160-9-4.8 MCG/ACT IN AERO: 2.0000 | INHALATION_SPRAY | Freq: Two times a day (BID) | RESPIRATORY_TRACT | 11 refills | Status: AC

## 2024-03-03 MED ORDER — ALBUTEROL SULFATE HFA 108 (90 BASE) MCG/ACT IN AERS
2.0000 | INHALATION_SPRAY | Freq: Four times a day (QID) | RESPIRATORY_TRACT | 0 refills | Status: DC | PRN
Start: 2024-03-03 — End: 2024-03-31

## 2024-03-03 MED ORDER — FLUTICASONE FUROATE-VILANTEROL 100-25 MCG/ACT IN AEPB: 1.0000 | INHALATION_SPRAY | Freq: Every day | RESPIRATORY_TRACT | 3 refills | Status: DC

## 2024-03-03 NOTE — Progress Notes (Signed)
 [facility] [address] [place], [place] [address]  Pulmonary Sleep Medicine   Office Visit Note  Patient Name: Joe Berry DOB: 20-Dec-1955 MRN [id number]  Date of Service: 03/03/2024  Complaints/HPI: He has been doing well overall. No admissions to the hospital Had cataract done recently. He states he is currently not on any inhalers and does not feel a need to use them but was open to the idea of having a rescue.  Otherwise he does not really want to be using any inhalers  Office Spirometry Results: Peak Flow: (!) 6 L/min FEV1: 0.69 liters FVC: 2.17 liters FEV1/FVC: 31.8 % FVC  % Predicted: 52 % FEV % Predicted: 49 % FeF 25-75: 0.91 liters FeF 25-75 % Predicted: 38   ROS  General: (-) fever, (-) chills, (-) night sweats, (-) weakness Skin: (-) rashes, (-) itching,. Eyes: (-) visual changes, (-) redness, (-) itching. Nose and Sinuses: (-) nasal stuffiness or itchiness, (-) postnasal drip, (-) nosebleeds, (-) sinus trouble. Mouth and Throat: (-) sore throat, (-) hoarseness. Neck: (-) swollen glands, (-) enlarged thyroid , (-) neck pain. Respiratory: - cough, (-) bloody sputum, + shortness of breath, - wheezing. Cardiovascular: - ankle swelling, (-) chest pain. Lymphatic: (-) lymph node enlargement. Neurologic: (-) numbness, (-) tingling. Psychiatric: (-) anxiety, (-) depression   Current Medication: Outpatient Encounter Medications as of 03/03/2024  Medication Sig   ACCU-CHEK FASTCLIX LANCETS MISC Use to test blood sugars twice daily e11.65   albuterol  (VENTOLIN  HFA) 108 (90 Base) MCG/ACT inhaler Inhale 2 puffs into the lungs every 6 (six) hours as needed for wheezing or shortness of breath.   aspirin  EC 81 MG tablet Take 1 tablet (81 mg total) by mouth daily.   budeson-glycopyrrolate-formoterol (BREZTRI  AEROSPHERE) 160-9-4.8 MCG/ACT AERO inhaler Inhale 2 puffs into the lungs 2 (two) times daily.   diphenhydrAMINE HCl 50 MG/30ML LIQD Take 30 mLs by mouth at  bedtime as needed (sleep).   Dulaglutide  (TRULICITY ) 3 MG/0.5ML SOAJ INJECT 3 MG (0.5 ML) UNDER THE SKIN ONCE A WEEK   furosemide  (LASIX ) 40 MG tablet Take 1 tablet (40 mg total) by mouth daily.   glipiZIDE  (GLUCOTROL  XL) 5 MG 24 hr tablet Take 1 tablet (5 mg total) by mouth daily with breakfast.   metFORMIN  (GLUCOPHAGE -XR) 750 MG 24 hr tablet TAKE 2 TABLETS DAILY WITH BREAKFAST (DISCONTINUE ALL PREVIOUS PRESCRIPTIONS FOR METFORMIN )   metoprolol  succinate (TOPROL -XL) 50 MG 24 hr tablet Take 1 tablet (50 mg total) by mouth daily.   omeprazole  (PRILOSEC) 20 MG capsule Take 1 capsule (20 mg total) by mouth daily.   potassium chloride  (KLOR-CON ) 10 MEQ tablet Take 1 tablet (10 mEq total) by mouth daily.   rosuvastatin  (CRESTOR ) 10 MG tablet Take 1 tablet (10 mg total) by mouth daily.   sacubitril -valsartan  (ENTRESTO ) 49-51 MG Take 1 tablet by mouth 2 (two) times daily.   spironolactone  (ALDACTONE ) 25 MG tablet Take 1 tablet (25 mg total) by mouth daily.   No facility-administered encounter medications on file as of 03/03/2024.    Surgical History: Past Surgical History:  Procedure Laterality Date   LEFT HEART CATH AND CORONARY ANGIOGRAPHY Left 04/12/2018   Procedure: LEFT HEART CATH AND CORONARY ANGIOGRAPHY;  Surgeon: Devorah Fonder, MD;  Location: [facility] INVASIVE CV LAB;  Service: Cardiovascular;  Laterality: Left;    Medical History: Past Medical History:  Diagnosis Date   Anxiety    Cataract    Chronic HFrEF (heart failure with reduced ejection fraction) (HCC)    a.  02/2018 Echo: EF 25-30%; b. 07/2018 Echo: EF 35-40%; c. 11/2020 Echo: EF 45-50%, diast dysfxn, nl RV fxn. Trace MR/TR.   COPD (chronic obstructive pulmonary disease) (HCC)    Diabetes mellitus without complication (HCC)    Essential hypertension 05/30/2023   NICM (nonischemic cardiomyopathy) (HCC)    a. 02/2018 Echo: EF 25-30%; b. 07/2018 Echo: EF 35-40%; c. 11/2020 Echo: EF 45-50%, diast dysfxn.   Nonobstructive CAD (coronary  artery disease)    a. 03/2018 Cath: LM nl, LAD mild diff dzs prox, heavy Ca2+, 60d, LCX nl/large, OM1 50, RCA nl. EF <20%.   Sleep apnea     Family History: Family History  Problem Relation Age of Onset   Heart disease Mother    Lung cancer Father    Heart disease Maternal Grandmother    Heart Problems Maternal Grandmother    Heart Problems Brother    Heart Problems Maternal Grandfather     Social History: Social History   Socioeconomic History   Marital status: Single    Spouse name: Not on file   Number of children: Not on file   Years of education: Not on file   Highest education level: Not on file  Occupational History   Not on file  Tobacco Use   Smoking status: Former    Current packs/day: 1.50    Average packs/day: 1.5 packs/day for 40.0 years (60.0 ttl pk-yrs)    Types: Cigarettes   Smokeless tobacco: Never   Tobacco comments:    quit 2008  Vaping Use   Vaping status: Never Used  Substance and Sexual Activity   Alcohol use: Yes    Alcohol/week: 1.0 standard drink of alcohol    Types: 1 Shots of liquor per week    Comment: occasional drink   Drug use: Never   Sexual activity: Not on file  Other Topics Concern   Not on file  Social History Narrative   Not on file   Social Drivers of Health   Financial Resource Strain: Not on file  Food Insecurity: Not on file  Transportation Needs: Not on file  Physical Activity: Not on file  Stress: Not on file  Social Connections: Not on file  Intimate Partner Violence: Not on file    Vital Signs: Blood pressure 123/72, pulse 89, temperature 98.2 F (36.8 C), resp. rate 16, height 5\' 8"  (1.727 m), weight 228 lb 6.4 oz (103.6 kg), SpO2 98%, peak flow (!) 6 L/min.  Examination: General Appearance: The patient is well-developed, well-nourished, and in no distress. Skin: Gross inspection of skin unremarkable. Head: normocephalic, no gross deformities. Eyes: no gross deformities noted. ENT: ears appear grossly  normal no exudates. Neck: Supple. No thyromegaly. No LAD. Respiratory: distant no rhonchi noted. Cardiovascular: Normal S1 and S2 without murmur or rub. Extremities: No cyanosis. pulses are equal. Neurologic: Alert and oriented. No involuntary movements.  LABS: Recent Results (from the past 2160 hours)  Hgb A1C w/o eAG     Status: Abnormal   Collection Time: 02/21/24  9:00 AM  Result Value Ref Range   Hgb A1c MFr Bld 8.7 (H) 4.8 - 5.6 %    Comment:          Prediabetes: 5.7 - 6.4          Diabetes: >6.4          Glycemic control for adults with diabetes: <7.0     Radiology: CT Chest Wo Contrast Result Date: 03/02/2022 CLINICAL DATA:  Follow-up multiple pulmonary nodules less  than 6 mm. EXAM: CT CHEST WITHOUT CONTRAST TECHNIQUE: Multidetector CT imaging of the chest was performed following the standard protocol without IV contrast. RADIATION DOSE REDUCTION: This exam was performed according to the departmental dose-optimization program which includes automated exposure control, adjustment of the mA and/or kV according to patient size and/or use of iterative reconstruction technique. COMPARISON:  Most recent comparison 10/07/2020, most remote comparison 02/06/2018 FINDINGS: Cardiovascular: Aortic atherosclerosis without aneurysm. Coronary artery calcifications or stents. Heart is normal in size. Minimal anterior pericardial fluid/thickening, chronic and unchanged. Mediastinum/Nodes: No enlarged mediastinal lymph nodes. No obvious bulky hilar adenopathy in this unenhanced exam. No esophageal wall thickening. No visualized thyroid  nodule. Lungs/Pleura: 3-4 mm right upper lobe nodule series 3, image 61, stable dating back to 2019 exam, definitively benign. 2-3 mm right lower lobe nodule series 3, image 99, stable from 2020 exam, definitively benign. There are no new or enlarging pulmonary nodules. Mild apical predominant emphysema. Pleural calcifications in the mid lower right hemithorax with  associated pleural thickening and adjacent scarring, stable from prior exams. There is mild biapical pleuroparenchymal scarring. No acute airspace disease. No pleural effusion. Upper Abdomen: No acute or unexpected findings. Musculoskeletal: There are no acute or suspicious osseous abnormalities. Minimal left gynecomastia. IMPRESSION: 1. The small right-sided pulmonary nodules are stable dating back to 2019 and 2020 exam respectively, stability for greater than 2 years is consistent with a definitively benign etiology. No dedicated further follow-up is recommended. 2. No new pulmonary nodules. 3. Chronic pleural thickening and calcifications in the mid lower right hemithorax with scarring in the right lung, stable. 4. Mild emphysema. 5. Aortic atherosclerosis. Coronary artery calcifications or stents. Aortic Atherosclerosis (ICD10-I70.0) and Emphysema (ICD10-J43.9). Electronically Signed   By: Chadwick Colonel M.D.   On: 03/02/2022 20:15    No results found.  No results found.  Assessment and Plan: Patient Active Problem List   Diagnosis Date Noted   Hyperlipidemia associated with type 2 diabetes mellitus (HCC) 02/07/2024   Type 2 diabetes mellitus with diabetic cataract, without long-term current use of insulin (HCC) 11/08/2023   Gastroesophageal reflux disease without esophagitis 05/30/2023   Hypertension associated with diabetes (HCC) 05/30/2023   Uncontrolled type 2 diabetes mellitus with hyperglycemia (HCC) 05/30/2023   Chronic HFrEF (heart failure with reduced ejection fraction) (HCC) 06/14/2021   Pulmonary HTN (HCC) 05/04/2018   Unstable angina (HCC) 04/04/2018   NICM (nonischemic cardiomyopathy) (HCC) 04/03/2018   CAD (coronary artery disease) 04/03/2018   Empyema lung (HCC) 03/12/2018   Pleural effusion 03/12/2018   Pleural plaque without asbestos 03/12/2018   SOB (shortness of breath) 03/12/2018   Lung mass 03/12/2018    1. Obstructive chronic bronchitis without exacerbation  (HCC) (Primary)  - Spirometry with graph; Future - albuterol  (VENTOLIN  HFA) 108 (90 Base) MCG/ACT inhaler; Inhale 2 puffs into the lungs every 6 (six) hours as needed for wheezing or shortness of breath.  Dispense: 8 g; Refill: 0 - budeson-glycopyrrolate-formoterol (BREZTRI  AEROSPHERE) 160-9-4.8 MCG/ACT AERO inhaler; Inhale 2 puffs into the lungs 2 (two) times daily.  Dispense: 10.7 g; Refill: 11  2. Chronic HFrEF (heart failure with reduced ejection fraction) (HCC)  Appears to be compensated will go ahead and monitor fluid status follow-up with his cardiologist  3. Gastroesophageal reflux disease without esophagitis  Use PPI as needed for good control of his GERD this will also help with his breathing  4. Morbid obesity due to excess calories [facility])  Patient definitely needs to work on losing weight diet exercise discussed with him  at length   General Counseling: I have discussed the findings of the evaluation and examination with Joe Berry.  I have also discussed any further diagnostic evaluation thatmay be needed or ordered today. Joe Berry verbalizes understanding of the findings of todays visit. We also reviewed his medications today and discussed drug interactions and side effects including but not limited excessive drowsiness and altered mental states. We also discussed that there is always a risk not just to him but also people around him. he has been encouraged to call the office with any questions or concerns that should arise related to todays visit.  Orders Placed This Encounter  Procedures   Spirometry with graph    Standing Status:   Future    Expiration Date:   03/03/2025    Where should this test be performed?:   [facility]     Time spent: 76  I have personally obtained a history, examined the patient, evaluated laboratory and imaging results, formulated the assessment and plan and placed orders.    Cordie Deters, MD [facility] Pulmonary and Critical Care Sleep medicine

## 2024-03-29 ENCOUNTER — Other Ambulatory Visit: Payer: Self-pay | Admitting: Internal Medicine

## 2024-03-29 DIAGNOSIS — J4489 Other specified chronic obstructive pulmonary disease: Secondary | ICD-10-CM

## 2024-04-09 ENCOUNTER — Other Ambulatory Visit: Payer: Self-pay

## 2024-04-09 DIAGNOSIS — E1165 Type 2 diabetes mellitus with hyperglycemia: Secondary | ICD-10-CM

## 2024-04-09 NOTE — Progress Notes (Signed)
   04/09/2024 Name: Joe Berry MRN: [id number] DOB: 08-01-1956  No chief complaint on file.   Joe Berry is a 68 y.o. year old male who presented for a telephone visit.   They were referred to the pharmacist by a quality report for assistance in managing diabetes.    Subjective:  Care Team: Primary Care Provider: Laurence Pons, NP   Medication Access/Adherence  Current Pharmacy:  [facility] - [address] [place] - [address] [place] [address] Phone: [phone] Fax: [phone]  [id number] - [place], [place] - [address] AT [facility] [address] [place] [address] [place] [address] Phone: [phone] Fax: [phone]    Objective:  Lab Results  Component Value Date   HGBA1C 8.7 (H) 02/21/2024    Lab Results  Component Value Date   CREATININE 1.01 11/15/2023   BUN 18 11/15/2023   NA 138 11/15/2023   K 4.8 11/15/2023   CL 96 11/15/2023   CO2 27 11/15/2023    Lab Results  Component Value Date   CHOL 94 (L) 11/15/2023   HDL 34 (L) 11/15/2023   LDLCALC 41 11/15/2023   TRIG 98 11/15/2023   CHOLHDL 2.8 11/15/2023    Medications Reviewed Today   Medications were not reviewed in this encounter       Assessment/Plan:   I called and spoke with the patient today. I explained the purpose of the call and how he was identified. The patient initially mentioned that he had limited time to talk, as he needed to go to work. When asked if he would prefer to schedule a more convenient time, he responded, "I already have you on the phone, we can get this over with now."  As we reviewed some of his medications, the patient began to question whether the clinic had solicited his information and remarked that he thought "what we had was good." I reassured him that the clinic is not soliciting information, and I clarified the reason for my call--specifically, that I am a pharmacist reaching out  to discuss his diabetes care. I reiterated how and why he was contacted.  I also offered the patient the option to decline participation if he was uncomfortable. He chose to decline the services.  Of note, patient confirmed he receives Entresto  and Trulicity  through patient assistance. He has not been taking Breztri  due to cost and declined PAP. He stated that he would rather take an alternative medication than to do medication assistance for a tier 3 inhaler(s). He plans to speak with Dr. Meredeth Stallion about inhaler options since he believes he is doing "fine for now".  I was able to confirm he takes Trulicity  4.5 mg once weekly.    Per Dr. Anson Basta, his adherence rate is really good.   Follow Up Plan: N/A (patient declines services)  Alexandria Angel, PharmD Clinical Pharmacist Cell: [phone]

## 2024-05-08 ENCOUNTER — Other Ambulatory Visit: Payer: Self-pay

## 2024-05-08 DIAGNOSIS — J4489 Other specified chronic obstructive pulmonary disease: Secondary | ICD-10-CM

## 2024-06-20 LAB — HM DIABETES EYE EXAM

## 2024-06-23 ENCOUNTER — Other Ambulatory Visit: Payer: Self-pay | Admitting: Nurse Practitioner

## 2024-07-10 ENCOUNTER — Ambulatory Visit (INDEPENDENT_AMBULATORY_CARE_PROVIDER_SITE_OTHER): Admitting: Nurse Practitioner

## 2024-07-10 ENCOUNTER — Encounter: Payer: Self-pay | Admitting: Nurse Practitioner

## 2024-07-10 VITALS — BP 126/78 | HR 80 | Temp 98.2°F | Resp 16 | Ht 68.0 in | Wt 230.0 lb

## 2024-07-10 DIAGNOSIS — Z125 Encounter for screening for malignant neoplasm of prostate: Secondary | ICD-10-CM

## 2024-07-10 DIAGNOSIS — E1136 Type 2 diabetes mellitus with diabetic cataract: Secondary | ICD-10-CM | POA: Diagnosis not present

## 2024-07-10 DIAGNOSIS — E559 Vitamin D deficiency, unspecified: Secondary | ICD-10-CM | POA: Diagnosis not present

## 2024-07-10 DIAGNOSIS — J4489 Other specified chronic obstructive pulmonary disease: Secondary | ICD-10-CM | POA: Diagnosis not present

## 2024-07-10 DIAGNOSIS — E1169 Type 2 diabetes mellitus with other specified complication: Secondary | ICD-10-CM

## 2024-07-10 DIAGNOSIS — E782 Mixed hyperlipidemia: Secondary | ICD-10-CM

## 2024-07-10 DIAGNOSIS — E785 Hyperlipidemia, unspecified: Secondary | ICD-10-CM

## 2024-07-10 LAB — POCT GLYCOSYLATED HEMOGLOBIN (HGB A1C): Hemoglobin A1C: 8.7 % — AB (ref 4.0–5.6)

## 2024-07-10 MED ORDER — ALBUTEROL SULFATE HFA 108 (90 BASE) MCG/ACT IN AERS
1.0000 | INHALATION_SPRAY | Freq: Four times a day (QID) | RESPIRATORY_TRACT | 10 refills | Status: AC | PRN
Start: 2024-07-10 — End: ?

## 2024-07-10 NOTE — Progress Notes (Signed)
 [facility] [address] [place], [place] [address]  Internal MEDICINE  Office Visit Note  Patient Name: Joe Berry  [id number]  [id number]  Date of Service: 07/10/2024  Chief Complaint  Patient presents with   Diabetes   Hypertension   Follow-up    HPI Roxanne presents for a follow-up visit for diabetes, labs, screenings, COPD. Diabetes -- switch to ozempic from trulicity .  Received phone call from [facility] pharmacist, prefers not to receive any phone calls in the future.  High cholesterol -- taking rosuvastatin   COPD -- patient given samples of breztri  to try and will attempt to get approved for PAP if the breztri  helps.  Sees cardiology for heart failure.     Current Medication: Outpatient Encounter Medications as of 07/10/2024  Medication Sig Note   Semaglutide,0.25 or 0.5MG /DOS, (OZEMPIC, 0.25 OR 0.5 MG/DOSE,) 2 MG/3ML SOPN Inject 0.5 mg into the skin once a week.    ACCU-CHEK FASTCLIX LANCETS MISC Use to test blood sugars twice daily e11.65    albuterol  (VENTOLIN  HFA) 108 (90 Base) MCG/ACT inhaler Inhale 1-2 puffs into the lungs every 6 (six) hours as needed for wheezing or shortness of breath.    aspirin  EC 81 MG tablet Take 1 tablet (81 mg total) by mouth daily.    budeson-glycopyrrolate-formoterol (BREZTRI  AEROSPHERE) 160-9-4.8 MCG/ACT AERO inhaler Inhale 2 puffs into the lungs 2 (two) times daily. (Patient not taking: Reported on 04/09/2024) 04/09/2024: $706/ month via express scripts, per patient    furosemide  (LASIX ) 40 MG tablet Take 1 tablet (40 mg total) by mouth daily.    glipiZIDE  (GLUCOTROL  XL) 5 MG 24 hr tablet TAKE 1 TABLET DAILY WITH BREAKFAST (DISCONTINUE GLIMEPIRIDE )    metFORMIN  (GLUCOPHAGE -XR) 750 MG 24 hr tablet TAKE 2 TABLETS DAILY WITH BREAKFAST (DISCONTINUE ALL PREVIOUS PRESCRIPTIONS FOR METFORMIN )    metoprolol  succinate (TOPROL -XL) 50 MG 24 hr tablet Take 1 tablet (50 mg total) by mouth daily.    omeprazole  (PRILOSEC) 20 MG capsule Take 1  capsule (20 mg total) by mouth daily.    potassium chloride  (KLOR-CON ) 10 MEQ tablet Take 1 tablet (10 mEq total) by mouth daily.    rosuvastatin  (CRESTOR ) 10 MG tablet Take 1 tablet (10 mg total) by mouth daily.    spironolactone  (ALDACTONE ) 25 MG tablet Take 1 tablet (25 mg total) by mouth daily.    [DISCONTINUED] diphenhydrAMINE HCl 50 MG/30ML LIQD Take 30 mLs by mouth at bedtime as needed (sleep). (Patient not taking: Reported on 04/09/2024)    [DISCONTINUED] Dulaglutide  (TRULICITY ) 3 MG/0.5ML SOAJ INJECT 3 MG (0.5 ML) UNDER THE SKIN ONCE A WEEK (Patient taking differently: Inject 4.5 mg into the skin once a week.)    [DISCONTINUED] fluticasone  furoate-vilanterol (BREO ELLIPTA ) 100-25 MCG/ACT AEPB Inhale 1 puff into the lungs daily.    [DISCONTINUED] sacubitril -valsartan  (ENTRESTO ) 49-51 MG Take 1 tablet by mouth 2 (two) times daily.    [DISCONTINUED] VENTOLIN  HFA 108 (90 Base) MCG/ACT inhaler USE 2 INHALATIONS EVERY 6 HOURS AS NEEDED FOR WHEEZING OR SHORTNESS OF BREATH    No facility-administered encounter medications on file as of 07/10/2024.    Surgical History: Past Surgical History:  Procedure Laterality Date   LEFT HEART CATH AND CORONARY ANGIOGRAPHY Left 04/12/2018   Procedure: LEFT HEART CATH AND CORONARY ANGIOGRAPHY;  Surgeon: Perla Evalene PARAS, MD;  Location: [facility] INVASIVE CV LAB;  Service: Cardiovascular;  Laterality: Left;    Medical History: Past Medical History:  Diagnosis Date   Anxiety    Cataract  Chronic HFrEF (heart failure with reduced ejection fraction) (HCC)    a. 02/2018 Echo: EF 25-30%; b. 07/2018 Echo: EF 35-40%; c. 11/2020 Echo: EF 45-50%, diast dysfxn, nl RV fxn. Trace MR/TR.   COPD (chronic obstructive pulmonary disease) (HCC)    Diabetes mellitus without complication (HCC)    Essential hypertension 05/30/2023   NICM (nonischemic cardiomyopathy) (HCC)    a. 02/2018 Echo: EF 25-30%; b. 07/2018 Echo: EF 35-40%; c. 11/2020 Echo: EF 45-50%, diast dysfxn.    Nonobstructive CAD (coronary artery disease)    a. 03/2018 Cath: LM nl, LAD mild diff dzs prox, heavy Ca2+, 60d, LCX nl/large, OM1 50, RCA nl. EF <20%.   Sleep apnea     Family History: Family History  Problem Relation Age of Onset   Heart disease Mother    Lung cancer Father    Heart disease Maternal Grandmother    Heart Problems Maternal Grandmother    Heart Problems Brother    Heart Problems Maternal Grandfather     Social History   Socioeconomic History   Marital status: Single    Spouse name: Not on file   Number of children: Not on file   Years of education: Not on file   Highest education level: Not on file  Occupational History   Not on file  Tobacco Use   Smoking status: Former    Current packs/day: 1.50    Average packs/day: 1.5 packs/day for 40.0 years (60.0 ttl pk-yrs)    Types: Cigarettes   Smokeless tobacco: Never   Tobacco comments:    quit 2008  Vaping Use   Vaping status: Never Used  Substance and Sexual Activity   Alcohol use: Yes    Alcohol/week: 1.0 standard drink of alcohol    Types: 1 Shots of liquor per week    Comment: occasional drink   Drug use: Never   Sexual activity: Not on file  Other Topics Concern   Not on file  Social History Narrative   Not on file   Social Drivers of Health   Financial Resource Strain: Not on file  Food Insecurity: Not on file  Transportation Needs: Not on file  Physical Activity: Not on file  Stress: Not on file  Social Connections: Not on file  Intimate Partner Violence: Not on file      Review of Systems  Constitutional:  Positive for fatigue. Negative for chills and unexpected weight change.  HENT:  Negative for congestion, rhinorrhea, sneezing and sore throat.   Eyes:  Negative for redness.  Respiratory: Negative.  Negative for cough, chest tightness, shortness of breath and wheezing.   Cardiovascular: Negative.  Negative for chest pain and palpitations.  Gastrointestinal:  Negative for  abdominal pain, constipation, diarrhea, nausea and vomiting.  Genitourinary:  Negative for dysuria and frequency.  Musculoskeletal:  Negative for arthralgias, back pain, joint swelling and neck pain.  Skin:  Negative for rash.  Neurological: Negative.  Negative for tremors and numbness.  Hematological:  Negative for adenopathy. Does not bruise/bleed easily.  Psychiatric/Behavioral:  Negative for behavioral problems (Depression), sleep disturbance and suicidal ideas. The patient is not nervous/anxious.     Vital Signs: BP 126/78   Pulse 80   Temp 98.2 F (36.8 C)   Resp 16   Ht 5' 8 (1.727 m)   Wt 230 lb (104.3 kg)   SpO2 94%   BMI 34.97 kg/m    Physical Exam Vitals reviewed.  Constitutional:      General: He is not  in acute distress.    Appearance: Normal appearance. He is obese. He is not ill-appearing.  HENT:     Head: Normocephalic and atraumatic.  Eyes:     Pupils: Pupils are equal, round, and reactive to light.  Cardiovascular:     Rate and Rhythm: Normal rate and regular rhythm.  Pulmonary:     Effort: Pulmonary effort is normal. No respiratory distress.  Neurological:     Mental Status: He is alert and oriented to person, place, and time.  Psychiatric:        Mood and Affect: Mood normal.        Behavior: Behavior normal.        Assessment/Plan: 1. Type 2 diabetes mellitus with diabetic cataract, without long-term current use of insulin (HCC) (Primary) A1c remains elevated, no change at 8.7 today. Routine labs ordered. Discontinue trulicity  and start ozempic as prescribed. PAP application will be sent to [facility].  - POCT glycosylated hemoglobin (Hb A1C) - CBC with Differential/Platelet - CMP14+EGFR - Lipid Profile - Hgb A1C w/o eAG - Semaglutide,0.25 or 0.5MG /DOS, (OZEMPIC, 0.25 OR 0.5 MG/DOSE,) 2 MG/3ML SOPN; Inject 0.5 mg into the skin once a week.  2. Hyperlipidemia associated with type 2 diabetes mellitus (HCC) Continue rosuvastatin  as  prescribed. Routine labs ordered  - CBC with Differential/Platelet - CMP14+EGFR - Lipid Profile - Hgb A1C w/o eAG  3. Obstructive chronic bronchitis without exacerbation (HCC) Continue prn albuterol  as prescribed. Breztri  samples given to patient, he will call the office if the medication is helping and he wants to continue it. We will apply for PAP program.  - albuterol  (VENTOLIN  HFA) 108 (90 Base) MCG/ACT inhaler; Inhale 1-2 puffs into the lungs every 6 (six) hours as needed for wheezing or shortness of breath.  Dispense: 18 g; Refill: 10  4. Vitamin D  deficiency Routine lab ordered  - Vitamin D  (25 hydroxy)  5. Morbid obesity due to excess calories (HCC) Routine labs ordered  - CBC with Differential/Platelet - CMP14+EGFR - Lipid Profile - Hgb A1C w/o eAG - Vitamin D  (25 hydroxy)  6. Screening for prostate cancer Routine lab ordered  - PSA Total (Reflex To Free)    General Counseling: Aariv verbalizes understanding of the findings of todays visit and agrees with plan of treatment. I have discussed any further diagnostic evaluation that may be needed or ordered today. We also reviewed his medications today. he has been encouraged to call the office with any questions or concerns that should arise related to todays visit.    Orders Placed This Encounter  Procedures   CBC with Differential/Platelet   CMP14+EGFR   Lipid Profile   Hgb A1C w/o eAG   PSA Total (Reflex To Free)   Vitamin D  (25 hydroxy)   POCT glycosylated hemoglobin (Hb A1C)    Meds ordered this encounter  Medications   albuterol  (VENTOLIN  HFA) 108 (90 Base) MCG/ACT inhaler    Sig: Inhale 1-2 puffs into the lungs every 6 (six) hours as needed for wheezing or shortness of breath.    Dispense:  18 g    Refill:  10   Semaglutide,0.25 or 0.5MG /DOS, (OZEMPIC, 0.25 OR 0.5 MG/DOSE,) 2 MG/3ML SOPN    Sig: Inject 0.5 mg into the skin once a week.    Pap program    Return in about 4 months (around 11/09/2024)  for F/U, Recheck A1C, Lakai Moree PCP.   Total time spent:30 Minutes Time spent includes review of chart, medications, test results, and follow up plan  with the patient.   [place] Controlled Substance Database was reviewed by me.  This patient was seen by Mardy Maxin, FNP-C in collaboration with Dr. Sigrid Bathe as a part of collaborative care agreement.   Rolland Steinert R. Maxin, MSN, FNP-C Internal medicine

## 2024-07-18 ENCOUNTER — Telehealth: Payer: Self-pay

## 2024-07-18 NOTE — Telephone Encounter (Signed)
 Sent message to Joe Berry she will do application

## 2024-07-24 ENCOUNTER — Encounter: Payer: Self-pay | Admitting: Nurse Practitioner

## 2024-08-05 ENCOUNTER — Other Ambulatory Visit: Payer: Self-pay | Admitting: Nurse Practitioner

## 2024-08-07 ENCOUNTER — Telehealth: Payer: Self-pay

## 2024-08-07 NOTE — Telephone Encounter (Signed)
 Pt advised we received Ozempic samples from Patient assistance program ready for pickup

## 2024-08-20 ENCOUNTER — Other Ambulatory Visit: Payer: Self-pay | Admitting: Cardiovascular Disease

## 2024-08-20 MED ORDER — SACUBITRIL-VALSARTAN 49-51 MG PO TABS: 1.0000 | ORAL_TABLET | Freq: Two times a day (BID) | ORAL | 3 refills | Status: AC

## 2024-08-25 ENCOUNTER — Encounter: Payer: Self-pay | Admitting: Nurse Practitioner

## 2024-08-25 DIAGNOSIS — J4489 Other specified chronic obstructive pulmonary disease: Secondary | ICD-10-CM | POA: Insufficient documentation

## 2024-08-25 MED ORDER — OZEMPIC (0.25 OR 0.5 MG/DOSE) 2 MG/3ML ~~LOC~~ SOPN: 0.5000 mg | PEN_INJECTOR | SUBCUTANEOUS | Status: AC

## 2024-10-04 LAB — CMP14+EGFR
ALT: 18 IU/L (ref 0–44)
AST: 16 IU/L (ref 0–40)
Albumin: 4.1 g/dL (ref 3.9–4.9)
Alkaline Phosphatase: 38 IU/L — ABNORMAL LOW (ref 47–123)
BUN/Creatinine Ratio: 19 (ref 10–24)
BUN: 18 mg/dL (ref 8–27)
Bilirubin Total: 0.5 mg/dL (ref 0.0–1.2)
CO2: 22 mmol/L (ref 20–29)
Calcium: 9.4 mg/dL (ref 8.6–10.2)
Chloride: 96 mmol/L (ref 96–106)
Creatinine, Ser: 0.93 mg/dL (ref 0.76–1.27)
Globulin, Total: 3.1 g/dL (ref 1.5–4.5)
Glucose: 200 mg/dL — ABNORMAL HIGH (ref 70–99)
Potassium: 4.7 mmol/L (ref 3.5–5.2)
Sodium: 136 mmol/L (ref 134–144)
Total Protein: 7.2 g/dL (ref 6.0–8.5)
eGFR: 89 mL/min/1.73 (ref >59)

## 2024-10-04 LAB — LIPID PANEL
Chol/HDL Ratio: 3.1 ratio (ref 0.0–5.0)
Cholesterol, Total: 100 mg/dL (ref 100–199)
HDL: 32 mg/dL — ABNORMAL LOW (ref >39)
LDL Chol Calc (NIH): 48 mg/dL (ref 0–99)
Triglycerides: 109 mg/dL (ref 0–149)
VLDL Cholesterol Cal: 20 mg/dL (ref 5–40)

## 2024-10-04 LAB — CBC WITH DIFFERENTIAL/PLATELET
Basophils Absolute: 0.1 x10E3/uL (ref 0.0–0.2)
Basos: 1 %
EOS (ABSOLUTE): 0.8 x10E3/uL — ABNORMAL HIGH (ref 0.0–0.4)
Eos: 9 %
Hematocrit: 44.8 % (ref 37.5–51.0)
Hemoglobin: 14.3 g/dL (ref 13.0–17.7)
Immature Grans (Abs): 0 x10E3/uL (ref 0.0–0.1)
Immature Granulocytes: 0 %
Lymphocytes Absolute: 1.6 x10E3/uL (ref 0.7–3.1)
Lymphs: 17 %
MCH: 28.4 pg (ref 26.6–33.0)
MCHC: 31.9 g/dL (ref 31.5–35.7)
MCV: 89 fL (ref 79–97)
Monocytes Absolute: 0.8 x10E3/uL (ref 0.1–0.9)
Monocytes: 8 %
Neutrophils Absolute: 6.3 x10E3/uL (ref 1.4–7.0)
Neutrophils: 65 %
Platelets: 255 x10E3/uL (ref 150–450)
RBC: 5.03 x10E6/uL (ref 4.14–5.80)
RDW: 12.7 % (ref 11.6–15.4)
WBC: 9.7 x10E3/uL (ref 3.4–10.8)

## 2024-10-04 LAB — VITAMIN D 25 HYDROXY (VIT D DEFICIENCY, FRACTURES): Vit D, 25-Hydroxy: 29.7 ng/mL — ABNORMAL LOW (ref 30.0–100.0)

## 2024-10-04 LAB — PSA TOTAL (REFLEX TO FREE): Prostate Specific Ag, Serum: 1 ng/mL (ref 0.0–4.0)

## 2024-10-04 LAB — HGB A1C W/O EAG: Hgb A1c MFr Bld: 9.6 % — ABNORMAL HIGH (ref 4.8–5.6)

## 2024-10-22 ENCOUNTER — Other Ambulatory Visit: Payer: Self-pay

## 2024-10-22 DIAGNOSIS — R0602 Shortness of breath: Secondary | ICD-10-CM

## 2024-10-24 LAB — HM DIABETES EYE EXAM

## 2024-10-29 ENCOUNTER — Ambulatory Visit: Payer: Medicare Other | Admitting: Internal Medicine

## 2024-10-29 DIAGNOSIS — R0602 Shortness of breath: Secondary | ICD-10-CM

## 2024-11-04 ENCOUNTER — Ambulatory Visit: Admitting: Internal Medicine

## 2024-11-04 ENCOUNTER — Encounter: Payer: Self-pay | Admitting: Internal Medicine

## 2024-11-04 VITALS — BP 121/78 | HR 88 | Temp 98.0°F | Resp 16 | Ht 68.0 in | Wt 229.0 lb

## 2024-11-04 DIAGNOSIS — I5022 Chronic systolic (congestive) heart failure: Secondary | ICD-10-CM

## 2024-11-04 DIAGNOSIS — J4489 Other specified chronic obstructive pulmonary disease: Secondary | ICD-10-CM

## 2024-11-04 DIAGNOSIS — G4733 Obstructive sleep apnea (adult) (pediatric): Secondary | ICD-10-CM

## 2024-11-04 NOTE — Progress Notes (Signed)
 [facility] [address] [place], [place] [address]  Pulmonary Sleep Medicine   Office Visit Note  Patient Name: Joe Berry DOB: [birth year] MRN [id number]  Date of Service: 11/04/2024  Complaints/HPI: He has been doing well. He has had a PFT shows RLD and COPD. He has been working on trying to lose weight. He is on ozempic . He has not been able to lose weight. He has not been exercising and should look at starting. He states he does not smoke. He prepares food on his own or mostly eats out. He realizes he needs to increase his activity. He states he gets occasional shortness of breath on exertion. No chest pain noted. No cough or congestion  Office Spirometry Results:     ROS  General: (-) fever, (-) chills, (-) night sweats, (-) weakness Skin: (-) rashes, (-) itching,. Eyes: (-) visual changes, (-) redness, (-) itching. Nose and Sinuses: (-) nasal stuffiness or itchiness, (-) postnasal drip, (-) nosebleeds, (-) sinus trouble. Mouth and Throat: (-) sore throat, (-) hoarseness. Neck: (-) swollen glands, (-) enlarged thyroid , (-) neck pain. Respiratory: - cough, (-) bloody sputum, + shortness of breath, - wheezing. Cardiovascular: - ankle swelling, (-) chest pain. Lymphatic: (-) lymph node enlargement. Neurologic: (-) numbness, (-) tingling. Psychiatric: (-) anxiety, (-) depression   Current Medication: Outpatient Encounter Medications as of 11/04/2024  Medication Sig Note   ACCU-CHEK FASTCLIX LANCETS MISC Use to test blood sugars twice daily e11.65    albuterol  (VENTOLIN  HFA) 108 (90 Base) MCG/ACT inhaler Inhale 1-2 puffs into the lungs every 6 (six) hours as needed for wheezing or shortness of breath.    aspirin  EC 81 MG tablet Take 1 tablet (81 mg total) by mouth daily.    budeson-glycopyrrolate-formoterol (BREZTRI  AEROSPHERE) 160-9-4.8 MCG/ACT AERO inhaler Inhale 2 puffs into the lungs 2 (two) times daily. 04/09/2024: $706/ month via express scripts, per  patient    furosemide  (LASIX ) 40 MG tablet Take 1 tablet (40 mg total) by mouth daily.    glipiZIDE  (GLUCOTROL  XL) 5 MG 24 hr tablet TAKE 1 TABLET DAILY WITH BREAKFAST (DISCONTINUE GLIMEPIRIDE )    metFORMIN  (GLUCOPHAGE -XR) 750 MG 24 hr tablet TAKE 2 TABLETS DAILY WITH BREAKFAST (DISCONTINUE ALL PREVIOUS PRESCRIPTIONS FOR METFORMIN )    metoprolol  succinate (TOPROL -XL) 50 MG 24 hr tablet Take 1 tablet (50 mg total) by mouth daily.    omeprazole  (PRILOSEC) 20 MG capsule Take 1 capsule (20 mg total) by mouth daily.    potassium chloride  (KLOR-CON ) 10 MEQ tablet Take 1 tablet (10 mEq total) by mouth daily.    rosuvastatin  (CRESTOR ) 10 MG tablet Take 1 tablet (10 mg total) by mouth daily.    sacubitril -valsartan  (ENTRESTO ) 49-51 MG Take 1 tablet by mouth 2 (two) times daily.    Semaglutide ,0.25 or 0.5MG /DOS, (OZEMPIC , 0.25 OR 0.5 MG/DOSE,) 2 MG/3ML SOPN Inject 0.5 mg into the skin once a week.    spironolactone  (ALDACTONE ) 25 MG tablet Take 1 tablet (25 mg total) by mouth daily.    No facility-administered encounter medications on file as of 11/04/2024.    Surgical History: Past Surgical History:  Procedure Laterality Date   LEFT HEART CATH AND CORONARY ANGIOGRAPHY Left 04/12/2018   Procedure: LEFT HEART CATH AND CORONARY ANGIOGRAPHY;  Surgeon: Perla Evalene PARAS, MD;  Location: [facility] INVASIVE CV LAB;  Service: Cardiovascular;  Laterality: Left;    Medical History: Past Medical History:  Diagnosis Date   Anxiety    Cataract    Chronic HFrEF (heart failure with reduced  ejection fraction) (HCC)    a. 02/2018 Echo: EF 25-30%; b. 07/2018 Echo: EF 35-40%; c. 11/2020 Echo: EF 45-50%, diast dysfxn, nl RV fxn. Trace MR/TR.   COPD (chronic obstructive pulmonary disease) (HCC)    Diabetes mellitus without complication (HCC)    Essential hypertension 05/30/2023   NICM (nonischemic cardiomyopathy) (HCC)    a. 02/2018 Echo: EF 25-30%; b. 07/2018 Echo: EF 35-40%; c. 11/2020 Echo: EF 45-50%, diast dysfxn.    Nonobstructive CAD (coronary artery disease)    a. 03/2018 Cath: LM nl, LAD mild diff dzs prox, heavy Ca2+, 60d, LCX nl/large, OM1 50, RCA nl. EF <20%.   Sleep apnea     Family History: Family History  Problem Relation Age of Onset   Heart disease Mother    Lung cancer Father    Heart disease Maternal Grandmother    Heart Problems Maternal Grandmother    Heart Problems Brother    Heart Problems Maternal Grandfather     Social History: Social History   Socioeconomic History   Marital status: Single    Spouse name: Not on file   Number of children: Not on file   Years of education: Not on file   Highest education level: Not on file  Occupational History   Not on file  Tobacco Use   Smoking status: Former    Current packs/day: 1.50    Average packs/day: 1.5 packs/day for 40.0 years (60.0 ttl pk-yrs)    Types: Cigarettes   Smokeless tobacco: Never   Tobacco comments:    quit 2008  Vaping Use   Vaping status: Never Used  Substance and Sexual Activity   Alcohol use: Yes    Alcohol/week: 1.0 standard drink of alcohol    Types: 1 Shots of liquor per week    Comment: occasional drink   Drug use: Never   Sexual activity: Not on file  Other Topics Concern   Not on file  Social History Narrative   Not on file   Social Drivers of Health   Tobacco Use: Medium Risk (11/04/2024)   Patient History    Smoking Tobacco Use: Former    Smokeless Tobacco Use: Never    Passive Exposure: Not on Actuary Strain: Not on file  Food Insecurity: Not on file  Transportation Needs: Not on file  Physical Activity: Not on file  Stress: Not on file  Social Connections: Not on file  Intimate Partner Violence: Not on file  Depression (PHQ2-9): Low Risk (11/08/2023)   Depression (PHQ2-9)    PHQ-2 Score: 0  Alcohol Screen: Low Risk (08/01/2022)   Alcohol Screen    Last Alcohol Screening Score (AUDIT): 1  Housing: Not on file  Utilities: Not on file  Health Literacy: Not  on file    Vital Signs: Blood pressure 121/78, pulse 88, temperature 98 F (36.7 C), resp. rate 16, height 5' 8 (1.727 m), weight 229 lb (103.9 kg), SpO2 94%.  Examination: General Appearance: The patient is well-developed, well-nourished, and in no distress. Skin: Gross inspection of skin unremarkable. Head: normocephalic, no gross deformities. Eyes: no gross deformities noted. ENT: ears appear grossly normal no exudates. Neck: Supple. No thyromegaly. No LAD. Respiratory: no rhonchi noted. Cardiovascular: Normal S1 and S2 without murmur or rub. Extremities: No cyanosis. pulses are equal. Neurologic: Alert and oriented. No involuntary movements.  LABS: Recent Results (from the past 2160 hours)  CBC with Differential/Platelet     Status: Abnormal   Collection Time: 10/03/24  9:16 AM  Result Value Ref Range   WBC 9.7 3.4 - 10.8 x10E3/uL   RBC 5.03 4.14 - 5.80 x10E6/uL   Hemoglobin 14.3 13.0 - 17.7 g/dL   Hematocrit 55.1 62.4 - 51.0 %   MCV 89 79 - 97 fL   MCH 28.4 26.6 - 33.0 pg   MCHC 31.9 31.5 - 35.7 g/dL   RDW 87.2 88.3 - 84.5 %   Platelets 255 150 - 450 x10E3/uL   Neutrophils 65 Not Estab. %   Lymphs 17 Not Estab. %   Monocytes 8 Not Estab. %   Eos 9 Not Estab. %   Basos 1 Not Estab. %   Neutrophils Absolute 6.3 1.4 - 7.0 x10E3/uL   Lymphocytes Absolute 1.6 0.7 - 3.1 x10E3/uL   Monocytes Absolute 0.8 0.1 - 0.9 x10E3/uL   EOS (ABSOLUTE) 0.8 (H) 0.0 - 0.4 x10E3/uL   Basophils Absolute 0.1 0.0 - 0.2 x10E3/uL   Immature Granulocytes 0 Not Estab. %   Immature Grans (Abs) 0.0 0.0 - 0.1 x10E3/uL  CMP14+EGFR     Status: Abnormal   Collection Time: 10/03/24  9:16 AM  Result Value Ref Range   Glucose 200 (H) 70 - 99 mg/dL   BUN 18 8 - 27 mg/dL   Creatinine, Ser 9.06 0.76 - 1.27 mg/dL   eGFR 89 >40 fO/fpw/8.26   BUN/Creatinine Ratio 19 10 - 24   Sodium 136 134 - 144 mmol/L   Potassium 4.7 3.5 - 5.2 mmol/L   Chloride 96 96 - 106 mmol/L   CO2 22 20 - 29 mmol/L   Calcium   9.4 8.6 - 10.2 mg/dL   Total Protein 7.2 6.0 - 8.5 g/dL   Albumin 4.1 3.9 - 4.9 g/dL   Globulin, Total 3.1 1.5 - 4.5 g/dL   Bilirubin Total 0.5 0.0 - 1.2 mg/dL   Alkaline Phosphatase 38 (L) 47 - 123 IU/L   AST 16 0 - 40 IU/L   ALT 18 0 - 44 IU/L  Lipid Profile     Status: Abnormal   Collection Time: 10/03/24  9:16 AM  Result Value Ref Range   Cholesterol, Total 100 100 - 199 mg/dL   Triglycerides 890 0 - 149 mg/dL   HDL 32 (L) >60 mg/dL   VLDL Cholesterol Cal 20 5 - 40 mg/dL   LDL Chol Calc (NIH) 48 0 - 99 mg/dL   Chol/HDL Ratio 3.1 0.0 - 5.0 ratio    Comment:                                   T. Chol/HDL Ratio                                             Men  Women                               1/2 Avg.Risk  3.4    3.3                                   Avg.Risk  5.0    4.4  2X Avg.Risk  9.6    7.1                                3X Avg.Risk 23.4   11.0   Hgb A1C w/o eAG     Status: Abnormal   Collection Time: 10/03/24  9:16 AM  Result Value Ref Range   Hgb A1c MFr Bld 9.6 (H) 4.8 - 5.6 %    Comment:          Prediabetes: 5.7 - 6.4          Diabetes: >6.4          Glycemic control for adults with diabetes: <7.0   PSA Total (Reflex To Free)     Status: None   Collection Time: 10/03/24  9:16 AM  Result Value Ref Range   Prostate Specific Ag, Serum 1.0 0.0 - 4.0 ng/mL    Comment: Roche ECLIA methodology. According to the American Urological Association, Serum PSA should decrease and remain at undetectable levels after radical prostatectomy. The AUA defines biochemical recurrence as an initial PSA value 0.2 ng/mL or greater followed by a subsequent confirmatory PSA value 0.2 ng/mL or greater. Values obtained with different assay methods or kits cannot be used interchangeably. Results cannot be interpreted as absolute evidence of the presence or absence of malignant disease.    Reflex Criteria Comment     Comment: The percent free PSA is performed  on a reflex basis only when the total PSA is between 4.0 and 10.0 ng/mL.   Vitamin D  (25 hydroxy)     Status: Abnormal   Collection Time: 10/03/24  9:16 AM  Result Value Ref Range   Vit D, 25-Hydroxy 29.7 (L) 30.0 - 100.0 ng/mL    Comment: Vitamin D  deficiency has been defined by the Institute of Medicine and an Endocrine Society practice guideline as a level of serum 25-OH vitamin D  less than 20 ng/mL (1,2). The Endocrine Society went on to further define vitamin D  insufficiency as a level between 21 and 29 ng/mL (2). 1. IOM (Institute of Medicine). 2010. Dietary reference    intakes for calcium  and D. [place]: The    [employer]. 2. Holick MF, Binkley [place], Bischoff-Ferrari HA, et al.    Evaluation, treatment, and prevention of vitamin D     deficiency: an Endocrine Society clinical practice    guideline. JCEM. 2011 Jul; 96(7):1911-30.   HM DIABETES EYE EXAM     Status: Abnormal   Collection Time: 10/24/24 12:00 AM  Result Value Ref Range   HM Diabetic Eye Exam Retinopathy (A) No Retinopathy    Radiology: CT Chest Wo Contrast Result Date: 03/02/2022 CLINICAL DATA:  Follow-up multiple pulmonary nodules less than 6 mm. EXAM: CT CHEST WITHOUT CONTRAST TECHNIQUE: Multidetector CT imaging of the chest was performed following the standard protocol without IV contrast. RADIATION DOSE REDUCTION: This exam was performed according to the departmental dose-optimization program which includes automated exposure control, adjustment of the mA and/or kV according to patient size and/or use of iterative reconstruction technique. COMPARISON:  Most recent comparison 10/07/2020, most remote comparison 02/06/2018 FINDINGS: Cardiovascular: Aortic atherosclerosis without aneurysm. Coronary artery calcifications or stents. Heart is normal in size. Minimal anterior pericardial fluid/thickening, chronic and unchanged. Mediastinum/Nodes: No enlarged mediastinal lymph nodes. No obvious bulky hilar  adenopathy in this unenhanced exam. No esophageal wall thickening. No visualized thyroid  nodule. Lungs/Pleura: 3-4 mm right upper lobe nodule series  3, image 61, stable dating back to 2019 exam, definitively benign. 2-3 mm right lower lobe nodule series 3, image 99, stable from 2020 exam, definitively benign. There are no new or enlarging pulmonary nodules. Mild apical predominant emphysema. Pleural calcifications in the mid lower right hemithorax with associated pleural thickening and adjacent scarring, stable from prior exams. There is mild biapical pleuroparenchymal scarring. No acute airspace disease. No pleural effusion. Upper Abdomen: No acute or unexpected findings. Musculoskeletal: There are no acute or suspicious osseous abnormalities. Minimal left gynecomastia. IMPRESSION: 1. The small right-sided pulmonary nodules are stable dating back to 2019 and 2020 exam respectively, stability for greater than 2 years is consistent with a definitively benign etiology. No dedicated further follow-up is recommended. 2. No new pulmonary nodules. 3. Chronic pleural thickening and calcifications in the mid lower right hemithorax with scarring in the right lung, stable. 4. Mild emphysema. 5. Aortic atherosclerosis. Coronary artery calcifications or stents. Aortic Atherosclerosis (ICD10-I70.0) and Emphysema (ICD10-J43.9). Electronically Signed   By: Andrea Gasman M.D.   On: 03/02/2022 20:15    No results found.  No results found.  Assessment and Plan: Patient Active Problem List   Diagnosis Date Noted   Obstructive chronic bronchitis without exacerbation (HCC) 08/25/2024   Hyperlipidemia associated with type 2 diabetes mellitus (HCC) 02/07/2024   Type 2 diabetes mellitus with diabetic cataract, without long-term current use of insulin (HCC) 11/08/2023   Gastroesophageal reflux disease without esophagitis 05/30/2023   Hypertension associated with diabetes (HCC) 05/30/2023   Uncontrolled type 2 diabetes  mellitus with hyperglycemia (HCC) 05/30/2023   Chronic HFrEF (heart failure with reduced ejection fraction) (HCC) 06/14/2021   Pulmonary HTN (HCC) 05/04/2018   Unstable angina (HCC) 04/04/2018   NICM (nonischemic cardiomyopathy) (HCC) 04/03/2018   CAD (coronary artery disease) 04/03/2018   Empyema lung (HCC) 03/12/2018   Pleural effusion 03/12/2018   Pleural plaque without asbestos 03/12/2018   SOB (shortness of breath) 03/12/2018   Lung mass 03/12/2018    1. Obstructive chronic bronchitis without exacerbation (HCC) (Primary) He has some improvement since his last PFT. He states he uses breztri  as needed. Encouraged compliance with regularly scheduled dosage  2. Obesity, morbid (HCC) Obesity Counseling: Had a lengthy discussion regarding patients BMI and weight issues. Patient was instructed on portion control as well as increased activity. Also discussed caloric restrictions with trying to maintain intake less than 2000 Kcal. Discussions were made in accordance with the 5As of weight management. Simple actions such as not eating late and if able to, taking a walk is suggested.   3. Chronic HFrEF (heart failure with reduced ejection fraction) (HCC) Appears to be compensated follow up with cardiology   4. OSA not on CPAP could not tolerate it  General Counseling: I have discussed the findings of the evaluation and examination with Darina.  I have also discussed any further diagnostic evaluation thatmay be needed or ordered today. Lumir verbalizes understanding of the findings of todays visit. We also reviewed his medications today and discussed drug interactions and side effects including but not limited excessive drowsiness and altered mental states. We also discussed that there is always a risk not just to him but also people around him. he has been encouraged to call the office with any questions or concerns that should arise related to todays visit.  No orders of the defined types were  placed in this encounter.    Time spent: 61  I have personally obtained a history, examined the patient, evaluated laboratory and  imaging results, formulated the assessment and plan and placed orders.    Elfreda DELENA Bathe, MD [facility] Pulmonary and Critical Care Sleep medicine

## 2024-11-05 LAB — PULMONARY FUNCTION TEST

## 2024-11-07 ENCOUNTER — Encounter: Payer: Self-pay | Admitting: Nurse Practitioner

## 2024-11-10 ENCOUNTER — Ambulatory Visit (INDEPENDENT_AMBULATORY_CARE_PROVIDER_SITE_OTHER): Payer: Medicare Other | Admitting: Nurse Practitioner

## 2024-11-10 ENCOUNTER — Encounter: Payer: Self-pay | Admitting: Nurse Practitioner

## 2024-11-10 VITALS — BP 124/76 | HR 84 | Temp 97.3°F | Resp 16 | Ht 68.0 in | Wt 231.6 lb

## 2024-11-10 DIAGNOSIS — I5022 Chronic systolic (congestive) heart failure: Secondary | ICD-10-CM | POA: Diagnosis not present

## 2024-11-10 DIAGNOSIS — G4733 Obstructive sleep apnea (adult) (pediatric): Secondary | ICD-10-CM

## 2024-11-10 DIAGNOSIS — E1169 Type 2 diabetes mellitus with other specified complication: Secondary | ICD-10-CM | POA: Diagnosis not present

## 2024-11-10 DIAGNOSIS — E1136 Type 2 diabetes mellitus with diabetic cataract: Secondary | ICD-10-CM | POA: Diagnosis not present

## 2024-11-10 DIAGNOSIS — R300 Dysuria: Secondary | ICD-10-CM

## 2024-11-10 DIAGNOSIS — I152 Hypertension secondary to endocrine disorders: Secondary | ICD-10-CM

## 2024-11-10 DIAGNOSIS — E1159 Type 2 diabetes mellitus with other circulatory complications: Secondary | ICD-10-CM | POA: Diagnosis not present

## 2024-11-10 DIAGNOSIS — Z0001 Encounter for general adult medical examination with abnormal findings: Secondary | ICD-10-CM

## 2024-11-10 DIAGNOSIS — E785 Hyperlipidemia, unspecified: Secondary | ICD-10-CM

## 2024-11-10 NOTE — Progress Notes (Signed)
 [facility] [address] [place], [place] [address]  Internal MEDICINE  Office Visit Note  Patient Name: Joe Berry  [id number]  [id number]  Date of Service: 11/10/2024  Chief Complaint  Patient presents with   Diabetes   Hypertension   Medicare Wellness    HPI [name] presents for a medicare annual wellness visit.  Well-appearing 68 y.o. male with diabetes, hypertension, high cholesterol, heart failure and GERD  Routine CRC screening: cologuard done in January 2024, was negative, due again in January 2027.  Eye exam: seen on 06/20/24 and then again on 10/24/24.  foot exam: done  Labs: low vitamin D  Low HDL Elevated glucose, elevated A1c 9.6 Switched to ozempic , at 1 mg weekly now but not noticing much difference.  New or worsening pain: none  Other concerns: none      11/08/2023    8:38 AM 11/02/2022   11:13 AM 10/31/2021    8:32 AM  MMSE - Mini Mental State Exam  Orientation to time 5 5 5   Orientation to Place 5 5 5   Registration 3 3 3   Attention/ Calculation 5 5 5   Recall 3 3 3   Language- name 2 objects 2 2 2   Language- repeat 1 1 1   Language- follow 3 step command 3 3 3   Language- read & follow direction 1 1 1   Write a sentence 1 1 1   Copy design 1 1 1   Total score 30 30 30     Functional Status Survey: Is the patient deaf or have difficulty hearing?: No Does the patient have difficulty seeing, even when wearing glasses/contacts?: No Does the patient have difficulty concentrating, remembering, or making decisions?: No Does the patient have difficulty walking or climbing stairs?: No Does the patient have difficulty dressing or bathing?: No Does the patient have difficulty doing errands alone such as visiting a doctor's office or shopping?: No     08/01/2022    8:40 AM 11/02/2022   11:11 AM 02/08/2023   10:29 AM 11/08/2023    8:37 AM 11/10/2024    9:16 AM  Fall Risk  Falls in the past year? 0 0 0 0 0  Was there an injury with Fall? 0  0  0  0   0  Fall Risk Category Calculator 0 0 0 0 0  Fall Risk Category (Retired) Low  Low      (RETIRED) Patient Fall Risk Level Low fall risk  Low fall risk      Patient at Risk for Falls Due to No Fall Risks No Fall Risks No Fall Risks No Fall Risks   Fall risk Follow up Falls evaluation completed  Falls evaluation completed  Falls evaluation completed Falls evaluation completed Falls evaluation completed     Data saved with a previous flowsheet row definition       11/10/2024    9:16 AM  Depression screen PHQ 2/9  Decreased Interest 0  Down, Depressed, Hopeless 0  PHQ - 2 Score 0       Current Medication: Outpatient Encounter Medications as of 11/10/2024  Medication Sig Note   ACCU-CHEK FASTCLIX LANCETS MISC Use to test blood sugars twice daily e11.65    albuterol  (VENTOLIN  HFA) 108 (90 Base) MCG/ACT inhaler Inhale 1-2 puffs into the lungs every 6 (six) hours as needed for wheezing or shortness of breath.    aspirin  EC 81 MG tablet Take 1 tablet (81 mg total) by mouth daily.    budeson-glycopyrrolate-formoterol (BREZTRI  AEROSPHERE) 160-9-4.8 MCG/ACT AERO inhaler  Inhale 2 puffs into the lungs 2 (two) times daily. 04/09/2024: $706/ month via express scripts, per patient    furosemide  (LASIX ) 40 MG tablet Take 1 tablet (40 mg total) by mouth daily.    glipiZIDE  (GLUCOTROL  XL) 5 MG 24 hr tablet TAKE 1 TABLET DAILY WITH BREAKFAST (DISCONTINUE GLIMEPIRIDE )    metFORMIN  (GLUCOPHAGE -XR) 750 MG 24 hr tablet TAKE 2 TABLETS DAILY WITH BREAKFAST (DISCONTINUE ALL PREVIOUS PRESCRIPTIONS FOR METFORMIN )    metoprolol  succinate (TOPROL -XL) 50 MG 24 hr tablet Take 1 tablet (50 mg total) by mouth daily.    omeprazole  (PRILOSEC) 20 MG capsule Take 1 capsule (20 mg total) by mouth daily.    potassium chloride  (KLOR-CON ) 10 MEQ tablet Take 1 tablet (10 mEq total) by mouth daily.    rosuvastatin  (CRESTOR ) 10 MG tablet Take 1 tablet (10 mg total) by mouth daily.    sacubitril -valsartan  (ENTRESTO ) 49-51 MG  Take 1 tablet by mouth 2 (two) times daily.    Semaglutide ,0.25 or 0.5MG /DOS, (OZEMPIC , 0.25 OR 0.5 MG/DOSE,) 2 MG/3ML SOPN Inject 0.5 mg into the skin once a week.    spironolactone  (ALDACTONE ) 25 MG tablet Take 1 tablet (25 mg total) by mouth daily.    No facility-administered encounter medications on file as of 11/10/2024.    Surgical History: Past Surgical History:  Procedure Laterality Date   LEFT HEART CATH AND CORONARY ANGIOGRAPHY Left 04/12/2018   Procedure: LEFT HEART CATH AND CORONARY ANGIOGRAPHY;  Surgeon: Perla Evalene PARAS, MD;  Location: [facility] INVASIVE CV LAB;  Service: Cardiovascular;  Laterality: Left;    Medical History: Past Medical History:  Diagnosis Date   Anxiety    Cataract    Chronic HFrEF (heart failure with reduced ejection fraction) (HCC)    a. 02/2018 Echo: EF 25-30%; b. 07/2018 Echo: EF 35-40%; c. 11/2020 Echo: EF 45-50%, diast dysfxn, nl RV fxn. Trace MR/TR.   COPD (chronic obstructive pulmonary disease) (HCC)    Diabetes mellitus without complication (HCC)    Essential hypertension 05/30/2023   NICM (nonischemic cardiomyopathy) (HCC)    a. 02/2018 Echo: EF 25-30%; b. 07/2018 Echo: EF 35-40%; c. 11/2020 Echo: EF 45-50%, diast dysfxn.   Nonobstructive CAD (coronary artery disease)    a. 03/2018 Cath: LM nl, LAD mild diff dzs prox, heavy Ca2+, 60d, LCX nl/large, OM1 50, RCA nl. EF <20%.   Sleep apnea     Family History: Family History  Problem Relation Age of Onset   Heart disease Mother    Lung cancer Father    Heart disease Maternal Grandmother    Heart Problems Maternal Grandmother    Heart Problems Brother    Heart Problems Maternal Grandfather     Social History   Socioeconomic History   Marital status: Single    Spouse name: Not on file   Number of children: Not on file   Years of education: Not on file   Highest education level: Not on file  Occupational History   Not on file  Tobacco Use   Smoking status: Former    Current packs/day: 1.50     Average packs/day: 1.5 packs/day for 40.0 years (60.0 ttl pk-yrs)    Types: Cigarettes   Smokeless tobacco: Never   Tobacco comments:    quit 2008  Vaping Use   Vaping status: Never Used  Substance and Sexual Activity   Alcohol use: Yes    Alcohol/week: 1.0 standard drink of alcohol    Types: 1 Shots of liquor per week    Comment:  occasional drink   Drug use: Never   Sexual activity: Not on file  Other Topics Concern   Not on file  Social History Narrative   Not on file   Social Drivers of Health   Tobacco Use: Medium Risk (11/10/2024)   Patient History    Smoking Tobacco Use: Former    Smokeless Tobacco Use: Never    Passive Exposure: Not on Actuary Strain: Not on file  Food Insecurity: Not on file  Transportation Needs: Not on file  Physical Activity: Not on file  Stress: Not on file  Social Connections: Not on file  Intimate Partner Violence: Not on file  Depression (PHQ2-9): Low Risk (11/10/2024)   Depression (PHQ2-9)    PHQ-2 Score: 0  Alcohol Screen: Low Risk (08/01/2022)   Alcohol Screen    Last Alcohol Screening Score (AUDIT): 1  Housing: Not on file  Utilities: Not on file  Health Literacy: Not on file      Review of Systems  Constitutional:  Negative for activity change, appetite change, chills, fatigue, fever and unexpected weight change.  HENT: Negative.  Negative for congestion, ear pain, rhinorrhea, sore throat and trouble swallowing.   Eyes: Negative.   Respiratory: Negative.  Negative for cough, chest tightness, shortness of breath and wheezing.   Cardiovascular: Negative.  Negative for chest pain.  Gastrointestinal: Negative.  Negative for abdominal pain, blood in stool, constipation, diarrhea, nausea and vomiting.  Endocrine: Negative.   Genitourinary: Negative.  Negative for difficulty urinating, dysuria, frequency, hematuria and urgency.  Musculoskeletal: Negative.  Negative for arthralgias, back pain, joint swelling,  myalgias and neck pain.  Skin: Negative.  Negative for rash and wound.  Allergic/Immunologic: Negative.  Negative for immunocompromised state.  Neurological: Negative.  Negative for dizziness, seizures, numbness and headaches.  Hematological: Negative.   Psychiatric/Behavioral: Negative.  Negative for behavioral problems, self-injury and suicidal ideas. The patient is not nervous/anxious.     Vital Signs: BP 124/76   Pulse 84   Temp (!) 97.3 F (36.3 C)   Resp 16   Ht 5' 8 (1.727 m)   Wt 231 lb 9.6 oz (105.1 kg)   SpO2 96%   BMI 35.21 kg/m    Physical Exam Vitals reviewed.  Constitutional:      General: He is awake. He is not in acute distress.    Appearance: Normal appearance. He is well-developed and well-groomed. He is obese. He is not ill-appearing.  HENT:     Head: Normocephalic and atraumatic.     Right Ear: Tympanic membrane, ear canal and external ear normal.     Left Ear: Tympanic membrane, ear canal and external ear normal.     Nose: Nose normal. No congestion or rhinorrhea.     Mouth/Throat:     Lips: Pink.     Mouth: Mucous membranes are moist.     Pharynx: Oropharynx is clear. Uvula midline. No oropharyngeal exudate or posterior oropharyngeal erythema.  Eyes:     General: Lids are normal. Vision grossly intact. Gaze aligned appropriately. No scleral icterus.       Right eye: No discharge.        Left eye: No discharge.     Extraocular Movements: Extraocular movements intact.     Conjunctiva/sclera: Conjunctivae normal.     Pupils: Pupils are equal, round, and reactive to light.     Funduscopic exam:    Right eye: Red reflex present.        Left eye: Red  reflex present. Neck:     Thyroid : No thyromegaly.     Vascular: No JVD.     Trachea: Trachea and phonation normal. No tracheal deviation.  Cardiovascular:     Rate and Rhythm: Normal rate and regular rhythm.     Pulses: Normal pulses.          Dorsalis pedis pulses are 2+ on the right side and 2+ on  the left side.       Posterior tibial pulses are 2+ on the right side and 2+ on the left side.     Heart sounds: Normal heart sounds, S1 normal and S2 normal. No murmur heard.    No friction rub. No gallop.  Pulmonary:     Effort: Pulmonary effort is normal. No accessory muscle usage or respiratory distress.     Breath sounds: Normal breath sounds and air entry. No stridor. No wheezing or rales.  Chest:     Chest wall: No tenderness.  Abdominal:     General: Bowel sounds are normal. There is no distension.     Palpations: Abdomen is soft. There is no shifting dullness, fluid wave, mass or pulsatile mass.     Tenderness: There is no abdominal tenderness. There is no guarding.     Hernia: No hernia is present.  Musculoskeletal:        General: Normal range of motion.     Cervical back: Normal range of motion and neck supple.     Right lower leg: No edema.     Left lower leg: No edema.     Right foot: Normal range of motion. No deformity, bunion, Charcot foot, foot drop or prominent metatarsal heads.     Left foot: Normal range of motion. No deformity, bunion, Charcot foot, foot drop or prominent metatarsal heads.  Feet:     Right foot:     Protective Sensation: 6 sites tested.  6 sites sensed.     Skin integrity: Callus and dry skin present. No ulcer, blister, skin breakdown, erythema, warmth or fissure.     Toenail Condition: Right toenails are abnormally thick and long.     Left foot:     Protective Sensation: 6 sites tested.  6 sites sensed.     Skin integrity: Callus and dry skin present. No ulcer, blister, skin breakdown, erythema, warmth or fissure.     Toenail Condition: Left toenails are abnormally thick and long.  Skin:    General: Skin is warm and dry.     Capillary Refill: Capillary refill takes less than 2 seconds.  Neurological:     Mental Status: He is alert and oriented to person, place, and time.     Cranial Nerves: No cranial nerve deficit.     Motor: No abnormal  muscle tone.     Coordination: Coordination normal.     Gait: Gait normal.     Deep Tendon Reflexes: Reflexes are normal and symmetric.  Psychiatric:        Mood and Affect: Mood normal.        Behavior: Behavior normal. Behavior is cooperative.        Thought Content: Thought content normal.        Judgment: Judgment normal.        Assessment/Plan: 1. Encounter for Medicare annual examination with abnormal findings (Primary) Age-appropriate preventive screenings and vaccinations discussed. Routine labs for health maintenance ordered results discussed with the patient. PHM updated.    2. Type 2 diabetes mellitus with  diabetic cataract, without long-term current use of insulin (HCC) Urine sent for ACR. Continue medications as prescribed.  - Urine Microalbumin w/creat. ratio  3. Hypertension associated with diabetes (HCC) Stable, continue medications as prescribed.   4. Hyperlipidemia associated with type 2 diabetes mellitus (HCC) Continue rosuvastatin  as prescribed.   5. Chronic HFrEF (heart failure with reduced ejection fraction) (HCC) Continue medications as prescribed.   6. OSA (obstructive sleep apnea) Continue cpap use as instructed.   7. Dysuria Urine sent to lab  - UA/M w/rflx Culture, Routine  8. Obesity, morbid (HCC) Continue ozempic  as prescribed.       General Counseling: jax kentner understanding of the findings of todays visit and agrees with plan of treatment. I have discussed any further diagnostic evaluation that may be needed or ordered today. We also reviewed his medications today. he has been encouraged to call the office with any questions or concerns that should arise related to todays visit.    Orders Placed This Encounter  Procedures   UA/M w/rflx Culture, Routine   Urine Microalbumin w/creat. ratio    No orders of the defined types were placed in this encounter.   Return in about 2 months (around 01/11/2025) for F/U, Recheck A1C,  Teleshia Lemere PCP.   Total time spent:30 Minutes Time spent includes review of chart, medications, test results, and follow up plan with the patient.   [place] Controlled Substance Database was reviewed by me.  This patient was seen by Mardy Maxin, FNP-C in collaboration with Dr. Sigrid Bathe as a part of collaborative care agreement.  Kariss Longmire R. Maxin, MSN, FNP-C Internal medicine

## 2024-11-11 LAB — MICROALBUMIN / CREATININE URINE RATIO
Creatinine, Urine: 26.3 mg/dL
Microalb/Creat Ratio: 201 mg/g{creat} — ABNORMAL HIGH (ref 0–29)
Microalbumin, Urine: 52.8 ug/mL

## 2024-11-12 LAB — UA/M W/RFLX CULTURE, ROUTINE
Bilirubin, UA: NEGATIVE
Ketones, UA: NEGATIVE
Leukocytes,UA: NEGATIVE
Nitrite, UA: NEGATIVE
Protein,UA: NEGATIVE
RBC, UA: NEGATIVE
Specific Gravity, UA: 1.013 (ref 1.005–1.030)
Urobilinogen, Ur: 0.2 mg/dL (ref 0.2–1.0)
pH, UA: 5 (ref 5.0–7.5)

## 2024-11-12 LAB — MICROSCOPIC EXAMINATION
Bacteria, UA: NONE SEEN
Casts: NONE SEEN /LPF
Epithelial Cells (non renal): NONE SEEN /HPF (ref 0–10)
RBC, Urine: NONE SEEN /HPF (ref 0–2)
WBC, UA: NONE SEEN /HPF (ref 0–5)

## 2024-11-12 LAB — SPECIMEN STATUS REPORT

## 2024-11-20 ENCOUNTER — Encounter: Payer: Self-pay | Admitting: Nurse Practitioner

## 2024-11-26 ENCOUNTER — Other Ambulatory Visit: Payer: Self-pay | Admitting: Cardiovascular Disease

## 2024-12-04 NOTE — Procedures (Signed)
 [facility] [address] [place], [address]    Complete Pulmonary Function Testing Interpretation:  FINDINGS:  The forced vital capacity is mildly decreased.  FEV1 is 1.79 L which is 59% of predicted and is moderately decreased.  FEV1 FVC ratio is mildly decreased.  Postbronchodilator no significant change in FEV1 is noted.  The total lung capacity is mildly decreased.  Residual volume is normal.  FRC is decreased.  The DLCO was mildly decreased.  IMPRESSION:  This pulmonary function study is consistent with mild obstructive and mild restrictive lung disease clinical correlation is recommended.  Elfreda DELENA Bathe, MD [facility] Pulmonary Critical Care Medicine Sleep Medicine

## 2024-12-11 ENCOUNTER — Encounter: Payer: Self-pay | Admitting: Nurse Practitioner

## 2024-12-12 NOTE — Telephone Encounter (Signed)
 Patient notified

## 2024-12-18 ENCOUNTER — Other Ambulatory Visit: Payer: Self-pay | Admitting: Nurse Practitioner

## 2024-12-18 DIAGNOSIS — K219 Gastro-esophageal reflux disease without esophagitis: Secondary | ICD-10-CM

## 2024-12-18 DIAGNOSIS — E1165 Type 2 diabetes mellitus with hyperglycemia: Secondary | ICD-10-CM

## 2024-12-22 ENCOUNTER — Ambulatory Visit: Admitting: Cardiovascular Disease

## 2024-12-29 ENCOUNTER — Ambulatory Visit: Admitting: Cardiovascular Disease

## 2025-01-12 ENCOUNTER — Ambulatory Visit: Admitting: Nurse Practitioner

## 2025-03-02 ENCOUNTER — Ambulatory Visit: Admitting: Physician Assistant

## 2025-11-09 ENCOUNTER — Ambulatory Visit: Admitting: Internal Medicine

## 2025-11-11 ENCOUNTER — Encounter: Admitting: Internal Medicine

## 2025-11-11 ENCOUNTER — Ambulatory Visit: Admitting: Nurse Practitioner
